# Patient Record
Sex: Male | Born: 1950 | Race: White | Hispanic: No | Marital: Married | Smoking: Never smoker
Health system: Southern US, Community
[De-identification: ages and names within clinical notes are randomized; demographics above are authoritative.]

## PROBLEM LIST (undated history)

## (undated) DIAGNOSIS — R011 Cardiac murmur, unspecified: Secondary | ICD-10-CM

## (undated) DIAGNOSIS — I351 Nonrheumatic aortic (valve) insufficiency: Secondary | ICD-10-CM

## (undated) DIAGNOSIS — I10 Essential (primary) hypertension: Secondary | ICD-10-CM

## (undated) DIAGNOSIS — I480 Paroxysmal atrial fibrillation: Secondary | ICD-10-CM

## (undated) DIAGNOSIS — N189 Chronic kidney disease, unspecified: Secondary | ICD-10-CM

## (undated) DIAGNOSIS — M1990 Unspecified osteoarthritis, unspecified site: Secondary | ICD-10-CM

## (undated) DIAGNOSIS — I2510 Atherosclerotic heart disease of native coronary artery without angina pectoris: Secondary | ICD-10-CM

## (undated) HISTORY — PX: TONSILLECTOMY: SUR1361

---

## 2000-08-23 ENCOUNTER — Ambulatory Visit (HOSPITAL_COMMUNITY): Admission: RE | Admit: 2000-08-23 | Discharge: 2000-08-23 | Payer: Self-pay | Admitting: Gastroenterology

## 2000-08-23 ENCOUNTER — Encounter (INDEPENDENT_AMBULATORY_CARE_PROVIDER_SITE_OTHER): Payer: Self-pay | Admitting: Specialist

## 2004-08-18 ENCOUNTER — Encounter (INDEPENDENT_AMBULATORY_CARE_PROVIDER_SITE_OTHER): Payer: Self-pay | Admitting: *Deleted

## 2004-08-18 ENCOUNTER — Ambulatory Visit (HOSPITAL_COMMUNITY): Admission: RE | Admit: 2004-08-18 | Discharge: 2004-08-18 | Payer: Self-pay | Admitting: Gastroenterology

## 2004-09-07 ENCOUNTER — Ambulatory Visit (HOSPITAL_COMMUNITY): Admission: RE | Admit: 2004-09-07 | Discharge: 2004-09-07 | Payer: Self-pay | Admitting: Family Medicine

## 2008-09-17 ENCOUNTER — Ambulatory Visit (HOSPITAL_COMMUNITY): Admission: RE | Admit: 2008-09-17 | Discharge: 2008-09-17 | Payer: Self-pay | Admitting: Cardiology

## 2008-09-17 ENCOUNTER — Encounter (INDEPENDENT_AMBULATORY_CARE_PROVIDER_SITE_OTHER): Payer: Self-pay | Admitting: Cardiology

## 2009-10-23 ENCOUNTER — Emergency Department (HOSPITAL_COMMUNITY): Admission: EM | Admit: 2009-10-23 | Discharge: 2009-10-23 | Payer: Self-pay | Admitting: Emergency Medicine

## 2009-11-10 ENCOUNTER — Encounter: Admission: RE | Admit: 2009-11-10 | Discharge: 2009-11-10 | Payer: Self-pay | Admitting: Family Medicine

## 2010-10-07 ENCOUNTER — Emergency Department (HOSPITAL_BASED_OUTPATIENT_CLINIC_OR_DEPARTMENT_OTHER): Admission: EM | Admit: 2010-10-07 | Discharge: 2010-10-07 | Payer: Self-pay | Admitting: Emergency Medicine

## 2010-10-07 ENCOUNTER — Ambulatory Visit: Payer: Self-pay | Admitting: Radiology

## 2011-02-20 LAB — CBC
HCT: 41.8 % (ref 39.0–52.0)
Hemoglobin: 14 g/dL (ref 13.0–17.0)
MCHC: 33.5 g/dL (ref 30.0–36.0)
MCV: 89.2 fL (ref 78.0–100.0)
Platelets: 161 10*3/uL (ref 150–400)
RBC: 4.69 MIL/uL (ref 4.22–5.81)
RDW: 13.8 % (ref 11.5–15.5)
WBC: 6.8 10*3/uL (ref 4.0–10.5)

## 2011-02-20 LAB — CK TOTAL AND CKMB (NOT AT ARMC)
CK, MB: 8.6 ng/mL — ABNORMAL HIGH (ref 0.3–4.0)
Relative Index: 1.8 (ref 0.0–2.5)
Total CK: 474 U/L — ABNORMAL HIGH (ref 7–232)

## 2011-02-20 LAB — DIFFERENTIAL
Basophils Absolute: 0 10*3/uL (ref 0.0–0.1)
Basophils Relative: 1 % (ref 0–1)
Eosinophils Absolute: 0.2 10*3/uL (ref 0.0–0.7)
Eosinophils Relative: 3 % (ref 0–5)
Lymphocytes Relative: 18 % (ref 12–46)
Lymphs Abs: 1.2 10*3/uL (ref 0.7–4.0)
Monocytes Absolute: 0.7 10*3/uL (ref 0.1–1.0)
Monocytes Relative: 11 % (ref 3–12)
Neutro Abs: 4.6 10*3/uL (ref 1.7–7.7)
Neutrophils Relative %: 67 % (ref 43–77)

## 2011-02-20 LAB — BASIC METABOLIC PANEL
BUN: 12 mg/dL (ref 6–23)
CO2: 23 mEq/L (ref 19–32)
Calcium: 10.1 mg/dL (ref 8.4–10.5)
Chloride: 102 mEq/L (ref 96–112)
Creatinine, Ser: 1.25 mg/dL (ref 0.4–1.5)
GFR calc Af Amer: >60 mL/min (ref >60)
GFR calc non Af Amer: 59 mL/min — ABNORMAL LOW (ref >60)
Glucose, Bld: 125 mg/dL — ABNORMAL HIGH (ref 70–99)
Potassium: 4 mEq/L (ref 3.5–5.1)
Sodium: 137 mEq/L (ref 135–145)

## 2011-02-20 LAB — POCT CARDIAC MARKERS
CKMB, poc: 5.1 ng/mL (ref 1.0–8.0)
CKMB, poc: 6.2 ng/mL (ref 1.0–8.0)
Myoglobin, poc: 213 ng/mL (ref 12–200)
Myoglobin, poc: 268 ng/mL (ref 12–200)
Troponin i, poc: <0.05 ng/mL (ref 0.00–0.09)
Troponin i, poc: <0.05 ng/mL (ref 0.00–0.09)

## 2011-02-20 LAB — TROPONIN I: Troponin I: 0.01 ng/mL (ref 0.00–0.06)

## 2011-02-20 LAB — GLUCOSE, CAPILLARY: Glucose-Capillary: 111 mg/dL — ABNORMAL HIGH (ref 70–99)

## 2011-02-20 LAB — D-DIMER, QUANTITATIVE: D-Dimer, Quant: <0.22 ug/mL-FEU (ref 0.00–0.48)

## 2011-04-06 NOTE — Procedures (Signed)
[facility]  Patient:    KALYB, PEMBLE                         MRN: [id number] Proc. Date: 08/23/00 Adm. Date:  [id number] Attending:  Rich Brave CC:         Lillia Carmel, M.D.   Procedure Report  PROCEDURE:  Upper endoscopy with biopsies.  SURGEON:  Florencia Reasons, M.D.  INDICATIONS:  A 60 year old gentleman with longstanding reflux symptomatology, treated with over-the-counter antacids.  FINDINGS:  A 4 cm segment of Barretts esophagus.  DESCRIPTION OF PROCEDURE:  The nature, purpose, and risks of the procedure had been discussed with the patient who provided written consent.  Sedation was fentanyl 75 mcg and Versed 6.5 mg IV without arrhythmias or desaturation.  The Olympus video endoscope was passed under direct vision.  The laryngeal tissues appeared somewhat boggy or edematous.  I was not able to get a good look at the vocal cords due to the overlying epiglottis.  It is not clear whether or not these changes reflected laryngeal pharyngeal reflux changes DD:  08/23/00 TD:  08/24/00 Job: [id number]

## 2011-04-06 NOTE — Op Note (Signed)
Dustin [name], Dustin [name]                  ACCOUNT NO.:  [id number]   MEDICAL RECORD NO.:  [id number]          PATIENT TYPE:  AMB   LOCATION:  ENDO                         FACILITY:  [facility]   PHYSICIAN:  Bernette Redbird, M.D.   DATE OF BIRTH:  06-08-1951   DATE OF PROCEDURE:  08/18/2004  DATE OF DISCHARGE:                                 OPERATIVE REPORT   PROCEDURE:  Upper endoscopy with biopsies.   INDICATIONS:  This is a 60 year old general with Barrett's esophagus, with  biopsies 2 years ago showing intestinal metaplasia without dysplasia.   ENDOSCOPIST:  Bernette Redbird, M.D.   FINDINGS:  A circumferential 3 cm segment of Barrett's.   PROCEDURE:  The nature, purpose, risks of the procedure were familiar to the  patient from prior examination.  He provided written consent. Sedation was  Fentanyl 75 mcg and Versed 10 mg IV without arrhythmias or desaturation.  The Olympus video endoscope was advanced under direct vision. The vocal  cords were not well seen. The esophagus was entered without too much  difficulty. The proximal esophagus was normal, but the distal esophagus had  a 3 cm segment of circumferential  Barrett's esophagus.  No free reflux or  active esophagitis was seen, and there was no evidence of any varices,  infection, neoplasia or any ring or stricture. Multiple biopsies from the  Barrett's segment were obtained (approximately 20).  There was a 2-3 cm  hiatal hernia. The stomach contained a small clear residual.  Retroflex  viewing showed a slightly patulous diaphragmatic hiatus.  The gastric mucosa  was unremarkable, without evidence of gastritis, erosions, ulcers, polyps or  masses.  The pylorus, duodenal bulb and second duodenum looked normal. The  scope was removed from the patient.  He tolerated the procedure well and  there were no apparent complications.   IMPRESSION:  Medium segment Barrett's esophagus (530.85).   PLAN:  Await pathology results with anticipated  follow-up in 2 years.       RB/MEDQ  D:  08/18/2004  T:  08/18/2004  Job:  [id number]   cc:   Lillia Carmel, M.D.  [address]  [place] [address]  Fax: [phone]

## 2011-04-06 NOTE — Op Note (Signed)
[facility]  Patient:    Dustin [name], Dustin [name]                         MRN: [id number] Proc. Date: 08/23/00 Adm. Date:  [id number] Attending:  Rich Brave CC:         Lillia Carmel, M.D.   Operative Report  PROCEDURE:  Upper endoscopy with biopsy.  ENDOSCOPIST:  Florencia Reasons, M.D.  INDICATIONS:  A 60 year old with longstanding reflux symptomatology treated with over-the-counter antacids.  FINDINGS:  A 4 cm segment of Barretts esophagus.  DESCRIPTION OF PROCEDURE:  The nature, purpose, and risks of the procedure had been discussed with the patient who provided written consent.  Sedation was fentanyl 75 mcg and Versed 6.5 mg IV without arrhythmias or desaturation.  The Olympus video endoscope was passed under direct vision.  The laryngeal tissues appeared somewhat boggy or edematous; I was not able to get a good look at the vocal cords due to the overlying epiglottis.  It is not clear whether or not these changes reflected laryngeal pharyngeal reflux changes or not.  The esophagus was quite easily entered.  Although the proximal was normal, beginning at 36 cm and extending down to the region of the lower esophageal sphincter at about 40 cm, there was a 4 cm segment of completely circumferential Barretts esophagus.  No mass was evident nor was there any evident ring or stricture.  Multiple biopsies were obtained from the Barretts segment at the conclusion of the procedure.  There was no infection of varices infection, free gastroesophageal reflux, or active esophagitis.  There was a 2 cm hiatal hernia.  The stomach contained no significant residual and had normal mucosa without evidence of gastritis, erosions, ulcers, polyps, or masses, and a retroflex view of the proximal stomach just showed a very small hiatal hernia with a slightly patulous diaphragmatic hiatus.  The pylorus, duodenal bulb, and second duodenum looked  normal.  The patient tolerated this procedure well, and there were no apparent complications.  IMPRESSION:  A 4 cm segment of circumferential Barretts esophagus above a small hiatal hernia.  PLAN:  The above findings are somewhat surprising and concerning. Adenocarcinoma of the esophagus is most common in middle-aged Caucasian males, and thus this patient is probably at somewhat increased risk, especially in view of his endoscopic findings.  We will await the pathology on the histologic findings, initiate PPI therapy, and follow up with the patient in the office in about one month. DD:  08/23/00 TD:  08/24/00 Job: [id number]

## 2011-04-06 NOTE — Op Note (Signed)
[facility]  Patient:    Dustin [name], Dustin [name]                         MRN: [id number] Proc. Date: 08/23/00 Adm. Date:  [id number] Attending:  Rich Brave CC:         Lillia Carmel, M.D.   Operative Report  PROCEDURE:  Colonoscopy.  ENDOSCOPIST:  Florencia Reasons, M.D.  INDICATIONS:  A 60 year old gentleman, with solitary episode of small volume hematochezia attributed most likely to hemorrhoidal origin, for evaluation to exclude polyps or cancer and for colon cancer screening.  FINDINGS:  Normal exam to the cecum.  DESCRIPTION OF PROCEDURE:  The nature, purpose, and risks of the procedure had been discussed with the patient who provided written consent.  Sedation was fentanyl 100 mcg and Versed 10 mg IV without arrhythmias or desaturation. Digital exam was unremarkable.  The Olympus adult video colonoscope was able to be advanced to the cecum without much difficulty, turning the patient into the supine position to facilitate entry into the cecum.  Pullback was then performed.  The quality of he prep was excellent, and it is felt that all areas were seen.  This was a normal examination.  No polyps, cancer, colitis, vascular malformations, or diverticular disease were appreciated.  Retroflexion in the rectum was unremarkable.  There were some slightly prominent rectal veins and, during pullout through the anal canal, I encountered moderate internal hemorrhoids.  I did not see any anal fissures.  No biopsies were obtained during this exam which the patient tolerated well and without apparent complication.  IMPRESSION:  Normal colonoscopy apart from internal hemorrhoids which are the presumed source of his recent rectal bleeding.  PLAN:  Consider flexible sigmoidoscopy in about five years. DD:  08/23/00 TD:  08/24/00 Job: [id number]

## 2011-10-16 ENCOUNTER — Other Ambulatory Visit: Payer: Self-pay | Admitting: Family Medicine

## 2011-10-16 ENCOUNTER — Ambulatory Visit
Admission: RE | Admit: 2011-10-16 | Discharge: 2011-10-16 | Disposition: A | Discharge status: Home / Self Care | Payer: 59 | Source: Ambulatory Visit | Attending: Family Medicine | Admitting: Family Medicine

## 2011-10-16 DIAGNOSIS — M549 Dorsalgia, unspecified: Secondary | ICD-10-CM

## 2011-10-16 DIAGNOSIS — M48061 Spinal stenosis, lumbar region without neurogenic claudication: Secondary | ICD-10-CM

## 2012-04-16 ENCOUNTER — Other Ambulatory Visit: Payer: Self-pay | Admitting: Gastroenterology

## 2012-04-18 ENCOUNTER — Other Ambulatory Visit: Payer: Self-pay | Admitting: Cardiology

## 2016-09-29 LAB — GLUCOSE, POCT (MANUAL RESULT ENTRY): POC Glucose: 119 mg/dl — AB (ref 70–99)

## 2017-05-14 HISTORY — PX: CARDIAC CATHETERIZATION: SHX172

## 2017-07-08 DIAGNOSIS — Z9889 Other specified postprocedural states: Secondary | ICD-10-CM

## 2017-07-08 HISTORY — PX: MITRAL VALVE REPAIR (MV)/CORONARY ARTERY BYPASS GRAFTING (CABG): SHX5983

## 2017-07-08 HISTORY — PX: CORONARY ARTERY BYPASS GRAFT: SHX141

## 2017-07-08 HISTORY — DX: Other specified postprocedural states: Z98.890

## 2017-11-01 HISTORY — PX: OTHER SURGICAL HISTORY: SHX169

## 2018-07-01 ENCOUNTER — Encounter (INDEPENDENT_AMBULATORY_CARE_PROVIDER_SITE_OTHER): Payer: Self-pay | Admitting: Family Medicine

## 2018-07-01 ENCOUNTER — Ambulatory Visit (INDEPENDENT_AMBULATORY_CARE_PROVIDER_SITE_OTHER): Payer: Medicare HMO | Admitting: Family Medicine

## 2018-07-01 ENCOUNTER — Ambulatory Visit (INDEPENDENT_AMBULATORY_CARE_PROVIDER_SITE_OTHER): Payer: Self-pay | Admitting: Family Medicine

## 2018-07-01 DIAGNOSIS — E039 Hypothyroidism, unspecified: Secondary | ICD-10-CM | POA: Insufficient documentation

## 2018-07-01 DIAGNOSIS — G4733 Obstructive sleep apnea (adult) (pediatric): Secondary | ICD-10-CM | POA: Insufficient documentation

## 2018-07-01 DIAGNOSIS — Z9889 Other specified postprocedural states: Secondary | ICD-10-CM | POA: Insufficient documentation

## 2018-07-01 DIAGNOSIS — N529 Male erectile dysfunction, unspecified: Secondary | ICD-10-CM | POA: Insufficient documentation

## 2018-07-01 DIAGNOSIS — K219 Gastro-esophageal reflux disease without esophagitis: Secondary | ICD-10-CM | POA: Insufficient documentation

## 2018-07-01 DIAGNOSIS — E785 Hyperlipidemia, unspecified: Secondary | ICD-10-CM | POA: Insufficient documentation

## 2018-07-01 DIAGNOSIS — M79602 Pain in left arm: Secondary | ICD-10-CM

## 2018-07-01 HISTORY — DX: Other specified postprocedural states: Z98.890

## 2018-07-01 HISTORY — DX: Gastro-esophageal reflux disease without esophagitis: K21.9

## 2018-07-01 HISTORY — DX: Hypothyroidism, unspecified: E03.9

## 2018-07-01 MED ORDER — METHYLPREDNISOLONE 4 MG PO TBPK: ORAL_TABLET | ORAL | 0 refills | Status: DC

## 2018-07-01 NOTE — Progress Notes (Signed)
   Office Visit Note   Patient: Dustin [name]           Date of Birth: Apr 09, 1951           MRN: [id number] Visit Date: 07/01/2018 Requested by: No referring provider defined for this encounter. PCP: Selina CooleyWitten, Bobby, MD  Subjective: Chief Complaint  Patient presents with  . Left Upper Arm - Pain    HPI: His main complaint is left arm pain.  Symptoms started in the past month or 2, he thinks either from doing some yard work or from working out at [employer].  There was no injury to cause his pain.  He started noticing pain in the anterior shoulder deep within, with some radiation into the arm.  Occasional burning pain in his elbow, no obvious numbness in his hand.  He has not determined any weakness.  He does not take any medication for his pain, it is not severe or constant.  He cannot seem to re-create the pain.  Today he is feeling pretty well.  He does have a history of heart issues with mitral valve repair a year ago.  He also had bypass of the circumflex artery at the same time.  He has done well since then from a cardiac standpoint.                ROS: Otherwise noncontributory.  Assessment & Plan: Visit Diagnoses:  1. Left arm pain   2. S/P mitral valve repair     Plan: We will refer him to physical therapy.  Medrol Dosepak if symptoms worsen.  X-rays of cervical spine and possibly MRI scan if fails to improve.  He does have a history of cardiac issues, but I doubt this is a cardiac problem based on symptoms.  Follow-Up Instructions: No follow-ups on file.   Orders:  Orders Placed This Encounter  Procedures  . Ambulatory referral to Physical Therapy   Meds ordered this encounter  Medications  . methylPREDNISolone (MEDROL DOSEPAK) 4 MG TBPK tablet    Sig: Dose pack as directed.    Dispense:  21 tablet    Refill:  0      Procedures: No procedures performed   Clinical Data: No additional findings.  Objective: Vital Signs: There were no vitals taken for this  visit.  Physical Exam: Neck range of motion is full.  Shoulder range of motion is also full and pain-free.  No tenderness to palpation around his shoulder.  Upper extremity strength and reflexes are normal except slight weakness with intrinsic hand strength.  DTRs are 2+.  Cannot reproduce his pain on exam today.   Specialty Comments:  No specialty comments available.  Imaging: No results found.   PMFS History: Patient Active Problem List   Diagnosis Date Noted  . S/P mitral valve repair 07/01/2018  . OSA (obstructive sleep apnea) 07/01/2018  . GERD (gastroesophageal reflux disease) 07/01/2018  . Hyperlipidemia 07/01/2018  . Erectile dysfunction 07/01/2018  . Hypothyroid 07/01/2018   History reviewed. No pertinent past medical history.  History reviewed. No pertinent family history.  History reviewed. No pertinent surgical history. Social History   Occupational History  . Not on file  Tobacco Use  . Smoking status: Not on file  Substance and Sexual Activity  . Alcohol use: Not on file  . Drug use: Not on file  . Sexual activity: Not on file

## 2018-09-20 LAB — GLUCOSE, POCT (MANUAL RESULT ENTRY): POC Glucose: 142 mg/dl — AB (ref 70–99)

## 2020-11-02 ENCOUNTER — Ambulatory Visit (INDEPENDENT_AMBULATORY_CARE_PROVIDER_SITE_OTHER): Payer: Medicare HMO | Admitting: Family Medicine

## 2020-11-02 ENCOUNTER — Other Ambulatory Visit: Payer: Self-pay

## 2020-11-02 ENCOUNTER — Encounter: Payer: Self-pay | Admitting: Family Medicine

## 2020-11-02 DIAGNOSIS — G8929 Other chronic pain: Secondary | ICD-10-CM | POA: Diagnosis not present

## 2020-11-02 DIAGNOSIS — M545 Low back pain, unspecified: Secondary | ICD-10-CM

## 2020-11-02 DIAGNOSIS — M79672 Pain in left foot: Secondary | ICD-10-CM | POA: Diagnosis not present

## 2020-11-02 NOTE — Progress Notes (Signed)
Office Visit Note   Patient: Dustin [name]           Date of Birth: 1951-10-07           MRN: [id number] Visit Date: 11/02/2020 Requested by: [place] [address] [place],  [place] [address] PCP: Selina Cooley, MD  Subjective: Chief Complaint  Patient presents with  . Lower Back - Pain    Pain started about 8 months ago. Has had problems with the pain off and on x years. Worse at night with lying down. Excruciating pain across lower back when he first gets up in the morning. The pain is very low - patient was unsure if it is his back or his hips that are hurting. Ibuprofen 600 mg helps ease it. Has also tried Tylenol, celebrex and turmeric.  Marland Kitchen Left Heel - Pain    Pain plantar aspect of heel - feels it without shoes. Hurts off & on.    HPI: He is here with low back pain.  Symptoms started about 8 months ago, no injury.  He woke up 1 morning with pain in the lumbosacral.  And he has had pain on a daily basis since then.  I have seen him in the past for back pain, and in 2012 he had an MRI scan showing severe spinal stenosis at L4-5 with severe facet DJD.  Despite the MRI findings, he has done remarkably well over the years without requiring any major intervention.  Even with his current daily pain, he is still able to walk about 15,000 paces per day without having any radicular type symptoms, weakness or numbness.  He is using ibuprofen daily for pain and that seems to help.  He tried Celebrex but it did not work as well.  He tried stopping Lipitor for a few weeks but that did not make any difference.  Denies bowel or bladder dysfunction.  He had x-rays taken by his PCP showing severe degenerative changes.  He is also been having pain on the plantar aspect of his left heel for a couple months.                ROS:   All other systems were reviewed and are negative.  Objective: Vital Signs: There were no vitals taken for this visit.  Physical Exam:  General:  Alert and  oriented, in no acute distress. Pulm:  Breathing unlabored. Psy:  Normal mood, congruent affect. Skin: No rash  Low back: I am unable to reproduce any pain by palpation today.  Negative straight leg raise, no pain with internal/external hip range of motion and he has good motion bilaterally.  Lower extremity strength and reflexes are normal. Left heel: He has tight hamstrings and heel cords.  He is tender at the medial origin of the plantar fascia.    Imaging: No results found.  Assessment & Plan: 1.  Chronic low back pain with history of severe spinal stenosis, neurologic exam is nonfocal. -We will order a new MRI scan to further evaluate.  Could contemplate facet injections, physical therapy, etc. depending on the findings.  2.  Left heel plantar fasciitis -Hamstring and heel cord stretching.     Procedures: No procedures performed        PMFS History: Patient Active Problem List   Diagnosis Date Noted  . S/P mitral valve repair 07/01/2018  . OSA (obstructive sleep apnea) 07/01/2018  . GERD (gastroesophageal reflux disease) 07/01/2018  . Hyperlipidemia 07/01/2018  .  Erectile dysfunction 07/01/2018  . Hypothyroid 07/01/2018   History reviewed. No pertinent past medical history.  History reviewed. No pertinent family history.  History reviewed. No pertinent surgical history. Social History   Occupational History  . Not on file  Tobacco Use  . Smoking status: Not on file  . Smokeless tobacco: Not on file  Substance and Sexual Activity  . Alcohol use: Not on file  . Drug use: Not on file  . Sexual activity: Not on file

## 2020-11-28 ENCOUNTER — Telehealth: Payer: Self-pay | Admitting: Family Medicine

## 2020-11-28 ENCOUNTER — Ambulatory Visit
Admission: RE | Admit: 2020-11-28 | Discharge: 2020-11-28 | Disposition: A | Discharge status: Home / Self Care | Payer: Medicare HMO | Source: Ambulatory Visit | Attending: Family Medicine | Admitting: Family Medicine

## 2020-11-28 ENCOUNTER — Other Ambulatory Visit: Payer: Medicare HMO

## 2020-11-28 DIAGNOSIS — G8929 Other chronic pain: Secondary | ICD-10-CM

## 2020-11-28 NOTE — Telephone Encounter (Signed)
MRI shows severe spinal stenosis (narrowing of the spinal canal) at the L4-5 level, even worse than it was in 2012.    Treatment options include:  - Trial of physical therapy. - Referral for epidural steroid injections. - Surgical consult if fails conservative treatment.

## 2020-11-29 NOTE — Telephone Encounter (Signed)
I called and advised the patient of the results. He would like to try PT first, at our location.

## 2020-11-30 NOTE — Addendum Note (Signed)
Addended by: Lillia Carmel on: 11/30/2020 08:32 AM   Modules accepted: Orders

## 2020-11-30 NOTE — Telephone Encounter (Signed)
Ordered

## 2020-12-12 ENCOUNTER — Ambulatory Visit: Payer: Medicare HMO | Admitting: Physical Therapy

## 2020-12-12 ENCOUNTER — Other Ambulatory Visit: Payer: Self-pay

## 2020-12-12 ENCOUNTER — Encounter: Payer: Self-pay | Admitting: Physical Therapy

## 2020-12-12 DIAGNOSIS — M545 Low back pain, unspecified: Secondary | ICD-10-CM | POA: Diagnosis not present

## 2020-12-12 DIAGNOSIS — G8929 Other chronic pain: Secondary | ICD-10-CM | POA: Diagnosis not present

## 2020-12-12 NOTE — Patient Instructions (Signed)
Access Code: [email or web address] URL: [email or web address] Date: 12/12/2020 Prepared by: [name]  Exercises Alternating Single Leg Bridge - 2 x daily - 7 x weekly - 3 sets - 10 reps Supine Double Knee to Chest - 2 x daily - 7 x weekly - 1 sets - 3 reps - 30 hold Seated Lumbar Flexion Stretch - 2 x daily - 7 x weekly - 1 sets - 10 reps - 10 hold Standing Hamstring Curl with Resistance - 2 x daily - 7 x weekly - 2-3 sets - 10 reps Anti-Rotation Lateral Stepping with Press - 2 x daily - 7 x weekly - 3 sets - 10 reps Standard Plank - 2 x daily - 7 x weekly - 1 sets - 3 reps - 20-30 sec hold

## 2020-12-12 NOTE — Therapy (Signed)
[facility] Physical Therapy [address] [place], [place], [address] Phone: [phone]   Fax:  [phone]  Physical Therapy Evaluation  Patient Details  Name: Dustin [name] MRN: [id number] Date of Birth: 04-14-1951 Referring Provider (PT): Lavada Mesi, MD   Encounter Date: 12/12/2020   PT End of Session - 12/12/20 1322    Visit Number 1    Number of Visits 4    Date for PT Re-Evaluation 01/09/21    PT Start Time 1013    PT Stop Time 1102    PT Time Calculation (min) 49 min    Activity Tolerance Patient tolerated treatment well;No increased pain    Behavior During Therapy [facility] for tasks assessed/performed           History reviewed. No pertinent past medical history.  History reviewed. No pertinent surgical history.  There were no vitals filed for this visit.    Subjective Assessment - 12/12/20 1004    Subjective Patient reports low back pain that is worse upon wakening and has to complete a series of stretches for it to subside. He also reports he had open heart surgery in 2018 and since then he has done alot of walking and stretching, and likes to bike around trails. He has a daily goal 15,000 steps a day and usually gets at least 10,000.  He has alot of stretches he has implemented into his daily routine to keep the pain in check. He states his pain has been going on the past 8 months but has had pain for a while since his recent MRI showing L4/L5 spinal stenosis. He notes that he likes to sleep on his stomach or his side, cannot sleep on his back. For the most part he is not  limited in his daily life but would like to decrease pain he feels upon rising daily. He denies any N/T or radiating symtoms but does note some stiffness at times on bottom of his foot into the heel that he combats with massage and stretching.    Limitations Other (comment)   lying down   How long can you sit comfortably? long period of time    How long can you stand comfortably?  as long as necessary with stretching to break it up    How long can you walk comfortably? has fatigue with walking and stops and stretches when needed but can walk over 15,000 steps    Diagnostic tests MRI shows severe spinal stenosis (narrowing of the spinal canal) at the L4-5 level, even worse than it was in 2012    Patient Stated Goals hopes to decrease pain and get opinion on his stretching/exercise routine    Currently in Pain? Yes    Pain Score 2    aware of the pain but can get up to a 6   Pain Location Back    Pain Orientation Lower    Pain Descriptors / Indicators Dull;Aching    Pain Type Chronic pain    Pain Radiating Towards --    Pain Onset More than a month ago    Pain Frequency Intermittent    Aggravating Factors  waking up in am    Pain Relieving Factors advil    Multiple Pain Sites No              [facility] PT Assessment - 12/12/20 0001      Assessment   Medical Diagnosis Chronic Low back pain    Referring Provider (PT) Lavada Mesi, MD  Onset Date/Surgical Date 04/11/20    Next MD Visit none    Prior Therapy none      Precautions   Precautions None      Restrictions   Weight Bearing Restrictions No      Balance Screen   Has the patient fallen in the past 6 months No    Has the patient had a decrease in activity level because of a fear of falling?  No    Is the patient reluctant to leave their home because of a fear of falling?  No      Home [employer] manag[employer]vironment   Living Environment Private residence    Home Layout Two level    Additional Comments Patient has 2 flights of stairs in house, rarely uses basement stairs, other set to get to bedroom      Prior Function   Level of Independence Independent    Vocation Retired    Leisure walking, biking, going to gym      Cognition   Overall Cognitive Status Within Functional Limits for tasks assessed      Observation/Other Assessments   Focus on Therapeutic Outcomes (FOTO)  functional intake score 59%, predicted  68%      Posture/Postural Control   Posture/Postural Control No significant limitations      ROM / Strength   AROM / PROM / Strength AROM;Strength      AROM   Overall AROM  Within functional limits for tasks performed    AROM Assessment Site Lumbar    Lumbar Flexion WFL    Lumbar Extension 50%   advised pt. to avoid extension for his stretching regimen due to spinal stenosis   Lumbar - Right Side Bend WFL    Lumbar - Left Side Bend WFL    Lumbar - Right Rotation WFL    Lum[facility] - [facility]ation [facility]      Strength   Strength Assessment Site Knee;Hip;Ankle    Right/Left Hip Right;Left    Right Hip Flexion 5/5    Left Hip Flexion 5/5    Right/Left Knee Right;Left    Right Knee Flexion 4+/5    Right Knee Extension 5/5    Left Knee Flexion 4/5    Left Knee Extension 5/5    Right/Left Ankle Right;Left    Right Ankle Dorsiflexion 5/5    Left Ankle Dorsiflexion 5/5      Palpation   Spinal mobility no PA hypomobility indicated    Palpation comment no tightness in paraspinal area      Special Tests    Special Tests Lumbar;Sacrolliac Tests    Lumbar Tests --    Sacroiliac Tests  Sacral Thrust      Sacral thrust    Findings Negative    Side --   both                     Objective measurements completed on examination: See above findings.       [facility] Adult [facility] Treatment/Exercise - 12/12/20 0001      Exercises   Exercises Lumbar      Lumbar Exercises: Aerobic   Recumbent Bike L8 8 min                  PT Education - 12/12/20 1321    Education Details HEP, POC, spine care with spinal stenosis, sleeping corrections    Person(s) Educated Patient    Methods Explanation;Demonstration;Handout    Comprehension Verbalized understanding;Returned demonstration  PT Short Term Goals - 12/12/20 1335      PT SHORT TERM GOAL #1   Title Patient will be compliant and independent with HEP    Baseline HEP given    Time 2    Period Weeks    Status  New    Target Date 12/26/20             PT Long Term Goals - 12/12/20 1337      PT LONG TERM GOAL #1   Title Patient will improve FOTO score    Baseline functional intake score 59%, predicted 68%    Time 4    Period Weeks    Status New    Target Date 01/09/21      PT LONG TERM GOAL #2   Title Patient will improve all LE strength to 5/5    Baseline knee flexion 4/5    Time 4    Period Weeks    Status New    Target Date 01/09/21      PT LONG TERM GOAL #3   Title Patient will report less than 2/10 pain upon awakening in the morning    Baseline pain can be 6 or 7 upon rising    Time 4    Period Weeks    Status New    Target Date 01/09/21                  Plan - 12/12/20 1323    Clinical Impression Statement Patient presents as a 70 y/o male with LBP that is intermittent and flared up upon wakening in the morning and is bothered with lying down/sleeping. Patient is very active and very mobile showing minimal strength deficits in LE. He has a dedicated stretching regimen and walks without pain for miles.He is showing pain with lumbar extension bias consistent with lumbar spinal stenosis which was indicated on his recent MRI. Patient can benefit from skilled PT in order to add in more flexion based exercise and minimize extension actvities to decrease the close packed position of the spine. He is doing well overall and just needs a few visits to be able to manage his pain and modify activities PRN. Patient noted his biggest problem is with sleeping on his stomach and he wakes up with pain, educated patient to sleep with pillow underneath abdomen to move lumbar spine into flexion to decrease pain in the mornings. Patient did note some deficits with his balance that will be further investigated next visit to add to HEP.    Personal Factors and Comorbidities Age;Comorbidity 1    Examination-Activity Limitations Sleep    Stability/Clinical Decision Making Stable/Uncomplicated     Clinical Decision Making Low    Rehab Potential Excellent    PT Frequency 1x / week    PT Duration 4 weeks    PT Treatment/Interventions ADLs/Self Care Home Management;Cryotherapy;Stair training;Functional mobility training;Therapeutic activities;Therapeutic exercise;Balance training;Neuromuscular re-education    PT Next Visit Plan Check in with HEP, ask about sleep position change, test balance, bird dog    PT Home Exercise Plan Access code: [email or web address]    Consulted and Agree with Plan of Care Patient           Patient will benefit from skilled therapeutic intervention in order to improve the following deficits and impairments:  Decreased balance,Decreased strength,Pain  Visit Diagnosis: Chronic low back pain, unspecified back pain laterality, unspecified whether sciatica present - Plan: PT plan of care cert/re-cert     Problem List  Patient Active Problem List   Diagnosis Date Noted  . S/P mitral valve repair 07/01/2018  . OSA (obstructive sleep apnea) 07/01/2018  . GERD (gastroesophageal reflux disease) 07/01/2018  . Hyperlipidemia 07/01/2018  . Erectile dysfunction 07/01/2018  . Hypothyroid 07/01/2018    [name], SPT  12/12/2020, 1:47 PM   During this treatment session, this physical therapist was present, participating in and directing the treatment.   This note has been reviewed and this clinician agrees with the information provided.   [name], PT, DPT 12/12/20 2:52 PM . [facility] Physical Therapy [address] [place], [place], [address] Phone: [phone]   Fax:  [phone]  Name: Dustin [name] MRN: [id number] Date of Birth: November 11, 1951

## 2020-12-22 ENCOUNTER — Encounter: Payer: Medicare HMO | Admitting: Physical Therapy

## 2020-12-27 ENCOUNTER — Other Ambulatory Visit: Payer: Self-pay

## 2020-12-27 ENCOUNTER — Ambulatory Visit: Payer: Medicare HMO | Admitting: Physical Therapy

## 2020-12-27 ENCOUNTER — Encounter: Payer: Self-pay | Admitting: Physical Therapy

## 2020-12-27 DIAGNOSIS — G8929 Other chronic pain: Secondary | ICD-10-CM | POA: Diagnosis not present

## 2020-12-27 DIAGNOSIS — M545 Low back pain, unspecified: Secondary | ICD-10-CM

## 2020-12-27 NOTE — Patient Instructions (Addendum)
  Access Code: [email or web address] URL: [email or web address] Date: 12/27/2020 Prepared by: [name]  Exercises Seated Lumbar Flexion Stretch - 2 x daily - 7 x weekly - 1 sets - 10 reps - 10 hold Standing Hamstring Curl with Resistance - 2 x daily - 7 x weekly - 2-3 sets - 10 reps Anti-rotation with lateral stepping - 2 x daily - 7 x weekly - 3 sets - 10 reps Standing Diagonal Lift with Anchored Resistance - 2 x daily - 6 x weekly - 3 sets - 10 reps Standard Plank - 2 x daily - 7 x weekly - 1 sets - 3 reps - 20-30 sec hold Sit to Stand Without Arm Support - 2 x daily - 7 x weekly - 2-3 sets - 10 reps Tandem Stance with Support - 2 x daily - 7 x weekly - 3 reps - 20 sec hold

## 2020-12-27 NOTE — Therapy (Signed)
[facility] Physical Therapy [address] [place], [place], [address] Phone: [phone]   Fax:  [phone]  Physical Therapy Treatment  Patient Details  Name: Dustin [name] MRN: [id number] Date of Birth: 09/28/1951 Referring Provider (PT): Lavada Mesi, MD   Encounter Date: 12/27/2020   PT End of Session - 12/27/20 1059    Visit Number 2    Number of Visits 4    Date for PT Re-Evaluation 01/09/21    PT Start Time 1050    PT Stop Time 1148    PT Time Calculation (min) 58 min    Activity Tolerance Patient tolerated treatment well;No increased pain    Behavior During Therapy [facility] for tasks assessed/performed           History reviewed. No pertinent past medical history.  History reviewed. No pertinent surgical history.  There were no vitals filed for this visit.   Subjective Assessment - 12/27/20 1056    Subjective Patient relays overall his back feels better with less pain overall and he is able to take less NSAIDs now. He also relays he is sleeping better now. He does say that he gets dizzy when he lays on his back and he is confused about the anti rotation exercise. He denies any back pain upon arrival today.    Limitations Other (comment)   lying down   How long can you sit comfortably? long period of time    How long can you stand comfortably? as long as necessary with stretching to break it up    How long can you walk comfortably? has fatigue with walking and stops and stretches when needed but can walk over 15,000 steps    Diagnostic tests MRI shows severe spinal stenosis (narrowing of the spinal canal) at the L4-5 level, even worse than it was in 2012    Patient Stated Goals hopes to decrease pain and get opinion on his stretching/exercise routine    Currently in Pain? No/denies    Pain Onset More than a month ago           [facility] Adult PT Treatment/Exercise - 12/27/20 0001      Lumbar Exercises: Aerobic   Recumbent Bike L8 10 min      Lumbar  Exercises: Standing   Other Standing Lumbar Exercises tandem walking on foam beam 4 laps with forward gaze cues, side stepping on foam beam 4 laps, tandem stance 4x 30 sec bilat, SLS on foam beam with cone tapping on 4'' step 15x each leg    Other Standing Lumbar Exercises Plank on stability ball at table height 10 sec hold x4, antirotation 20 reps each side with red theraband      Lumbar Exercises: Seated   Sit to Stand 20 reps   no UE   Other Seated Lumbar Exercises stability ball marching 20 each leg, stability ball theraband diagonal chops 10x each side                  PT Education - 12/27/20 1403    Education Details HEP updated    Person(s) Educated Patient    Methods Explanation;Demonstration;Tactile cues;Verbal cues;Handout    Comprehension Verbalized understanding            PT Short Term Goals - 12/12/20 1335      PT SHORT TERM GOAL #1   Title Patient will be compliant and independent with HEP    Baseline HEP given    Time 2    Period  Weeks    Status New    Target Date 12/26/20             PT Long Term Goals - 12/12/20 1337      PT LONG TERM GOAL #1   Title Patient will improve FOTO score    Baseline functional intake score 59%, predicted 68%    Time 4    Period Weeks    Status New    Target Date 01/09/21      PT LONG TERM GOAL #2   Title Patient will improve all LE strength to 5/5    Baseline knee flexion 4/5    Time 4    Period Weeks    Status New    Target Date 01/09/21      PT LONG TERM GOAL #3   Title Patient will report less than 2/10 pain upon awakening in the morning    Baseline pain can be 6 or 7 upon rising    Time 4    Period Weeks    Status New    Target Date 01/09/21                 Plan - 12/27/20 1358    Clinical Impression Statement Patient is doing well with his HEP except dizziness with supine exercises. Refocused HEP to add more appropriate exercises and add in balance componenet for increased function and  coordination.    Personal Factors and Comorbidities Age;Comorbidity 1    Examination-Activity Limitations Sleep    Stability/Clinical Decision Making Stable/Uncomplicated    Rehab Potential Excellent    PT Frequency 1x / week    PT Duration 4 weeks    PT Treatment/Interventions ADLs/Self Care Home Management;Cryotherapy;Stair training;Functional mobility training;Therapeutic activities;Therapeutic exercise;Balance training;Neuromuscular re-education    PT Next Visit Plan Check in with HEP, progression for balance, add in more unstable core exercises exercise ball?    PT Home Exercise Plan Access code: [email or web address]    Consulted and Agree with Plan of Care Patient           Patient will benefit from skilled therapeutic intervention in order to improve the following deficits and impairments:  Decreased balance,Decreased strength,Pain  Visit Diagnosis: Chronic low back pain, unspecified back pain laterality, unspecified whether sciatica present     Problem List Patient Active Problem List   Diagnosis Date Noted  . S/P mitral valve repair 07/01/2018  . OSA (obstructive sleep apnea) 07/01/2018  . GERD (gastroesophageal reflux disease) 07/01/2018  . Hyperlipidemia 07/01/2018  . Erectile dysfunction 07/01/2018  . Hypothyroid 07/01/2018    [name], SPT 12/27/2020, 2:22 PM   During this treatment session, this physical therapist was present, participating in and directing the treatment.   This note has been reviewed and this clinician agrees with the information provided.  [name], PT, DPT 12/27/20 3:37 PM   [facility] Physical Therapy [address] [place], [place], [address] Phone: [phone]   Fax:  [phone]  Name: ADRIANO BISCHOF MRN: [id number] Date of Birth: [birth year]

## 2021-01-03 ENCOUNTER — Other Ambulatory Visit: Payer: Self-pay

## 2021-01-03 ENCOUNTER — Ambulatory Visit: Payer: Medicare HMO | Admitting: Physical Therapy

## 2021-01-03 DIAGNOSIS — M545 Low back pain, unspecified: Secondary | ICD-10-CM | POA: Diagnosis not present

## 2021-01-03 DIAGNOSIS — G8929 Other chronic pain: Secondary | ICD-10-CM | POA: Diagnosis not present

## 2021-01-03 NOTE — Therapy (Signed)
[facility] Physical Therapy [address] [place], [place], [address] Phone: [phone]   Fax:  [phone]  Physical Therapy Treatment  Patient Details  Name: Dustin [name] MRN: [id number] Date of Birth: 1951/05/23 Referring Provider (PT): Lavada Mesi, MD   Encounter Date: 01/03/2021   PT End of Session - 01/03/21 1152    Visit Number 3    Number of Visits 4    Date for PT Re-Evaluation 01/09/21    PT Start Time 1100    PT Stop Time 1145    PT Time Calculation (min) 45 min    Activity Tolerance Patient tolerated treatment well;No increased pain    Behavior During Therapy [facility] for tasks assessed/performed           No past medical history on file.  No past surgical history on file.  There were no vitals filed for this visit.   Subjective Assessment - 01/03/21 1150    Subjective Patient reports continued relief without the use of NSAIDs with the exception of before sleeping sometimes. He has been working on balance at home. Patient denies any pain today.    Limitations Other (comment)   lying down   How long can you sit comfortably? long period of time    How long can you stand comfortably? as long as necessary with stretching to break it up    How long can you walk comfortably? has fatigue with walking and stops and stretches when needed but can walk over 15,000 steps    Diagnostic tests MRI shows severe spinal stenosis (narrowing of the spinal canal) at the L4-5 level, even worse than it was in 2012    Patient Stated Goals hopes to decrease pain and get opinion on his stretching/exercise routine    Currently in Pain? No/denies    Pain Onset More than a month ago            [facility] Adult PT Treatment/Exercise - 01/03/21 0001      Lumbar Exercises: Stretches   Double Knee to Chest Stretch 2 reps;20 seconds      Lumbar Exercises: Aerobic   Recumbent Bike L1      Lumbar Exercises: Standing   Other Standing Lumbar Exercises tandem walking  on foam beam 4 laps with forward gaze cues, side stepping on foam beam 4 laps, tandem stance 6x 30 sec bilat with head nods and side to side gaze progress gradually from full UE to prn UE, SLS on foam beam with cone tapping on 4'' step 20x each leg with forward mirror gaze, SLS 30 sec bilat 2x    Other Standing Lumbar Exercises Plank on stability ball at table height 20 sec hold x4, stability ball overhead walking 20 feet 6x      Lumbar Exercises: Seated   Sit to Stand 20 reps   no UE   Other Seated Lumbar Exercises stability ball rollouts 10x 10 seconds    Other Seated Lumbar Exercises stability ball marching 10x each side no UE      Lumbar Exercises: Supine   Other Supine Lumbar Exercises reverse bird dog with stability ball 10x each leg                    PT Short Term Goals - 01/03/21 1158      PT SHORT TERM GOAL #1   Title Patient will be compliant and independent with HEP    Baseline HEP given    Time 2  Period Weeks    Status Achieved    Target Date 12/26/20             PT Long Term Goals - 01/03/21 1158      PT LONG TERM GOAL #1   Title Patient will improve FOTO score    Baseline functional intake score 59%, predicted 68%    Time 4    Period Weeks    Status On-going      PT LONG TERM GOAL #2   Title Patient will improve all LE strength to 5/5    Baseline knee flexion 4/5    Time 4    Period Weeks    Status On-going      PT LONG TERM GOAL #3   Title Patient will report less than 2/10 pain upon awakening in the morning    Baseline pain can be 6 or 7 upon rising    Time 4    Period Weeks    Status On-going                 Plan - 01/03/21 1153    Clinical Impression Statement Patient was able to tolerate 2 supine exercises without increased dizziness this visit. This session focused on progressing his core stability and improving his static and dynamic balance. Patient has excellent endurance during tx session but requires additional cueing  for new exercises. Next visit update measurements, check in on goals and reassess HEP for continuing independently.    Personal Factors and Comorbidities Age;Comorbidity 1    Examination-Activity Limitations Sleep    Stability/Clinical Decision Making Stable/Uncomplicated    Rehab Potential Excellent    PT Frequency 1x / week    PT Duration 4 weeks    PT Treatment/Interventions ADLs/Self Care Home Management;Cryotherapy;Stair training;Functional mobility training;Therapeutic activities;Therapeutic exercise;Balance training;Neuromuscular re-education    PT Next Visit Plan update for advanced HEP, reassess, FOTO, discharge if patient feels ready    PT Home Exercise Plan Access code: [email or web address]    Consulted and Agree with Plan of Care Patient           Patient will benefit from skilled therapeutic intervention in order to improve the following deficits and impairments:  Decreased balance,Decreased strength,Pain  Visit Diagnosis: Chronic low back pain, unspecified back pain laterality, unspecified whether sciatica present     Problem List Patient Active Problem List   Diagnosis Date Noted  . S/P mitral valve repair 07/01/2018  . OSA (obstructive sleep apnea) 07/01/2018  . GERD (gastroesophageal reflux disease) 07/01/2018  . Hyperlipidemia 07/01/2018  . Erectile dysfunction 07/01/2018  . Hypothyroid 07/01/2018    [name], SPT 01/03/2021, 12:00 PM   During this treatment session, this physical therapist was present, participating in and directing the treatment.   This note has been reviewed and this clinician agrees with the information provided.  [name], PT, DPT 01/03/21 1:45 PM   [facility] Physical Therapy [address] [place], [place], [address] Phone: [phone]   Fax:  [phone]  Name: Dustin [name] MRN: [id number] Date of Birth: [birth year]

## 2021-01-09 ENCOUNTER — Other Ambulatory Visit: Payer: Self-pay

## 2021-01-09 ENCOUNTER — Ambulatory Visit (INDEPENDENT_AMBULATORY_CARE_PROVIDER_SITE_OTHER): Payer: Medicare HMO | Admitting: Family Medicine

## 2021-01-09 ENCOUNTER — Encounter: Payer: Self-pay | Admitting: Family Medicine

## 2021-01-09 DIAGNOSIS — M79602 Pain in left arm: Secondary | ICD-10-CM

## 2021-01-09 NOTE — Progress Notes (Signed)
Office Visit Note   Patient: Dustin [name]           Date of Birth: [birth year]           MRN: [id number] Visit Date: 01/09/2021 Requested by: [place] [address] [place],  [place] [address] PCP: Selina Cooley, MD  Subjective: Chief Complaint  Patient presents with  . Left Wrist - Pain    Has been having pain in the wrist and thumb "for a long time." The day after he called for an appointment last week, a bruise appeared on his forearm. He recalls no injury. The bruised area is a "little sensitive." Right-hand dominant.  . Left Forearm - Pain    HPI: He is here with left forearm pain.  Symptoms started about a week and a half ago, no injury.  He started noticing soreness on the volar aspect of his forearm.  Then 1 day when he went to the gym he began noticing increasing pain lifting weights with his left arm, and then more intense pain after doing some chest exercises.  Over the next couple days his forearm began to swell and became bruised.  The bruising is resolving but still present.  He no longer has the same pain that he had before.  He is right-hand dominant.  I saw him a couple years ago for left arm radicular pain but that went away and his pain is unlike other pain he has had.  From a spinal stenosis standpoint, he is improved as long as he does his physical therapy exercises on a daily basis.  Symptoms are tolerable right now, he does not feel that surgery is needed.                ROS:   All other systems were reviewed and are negative.  Objective: Vital Signs: There were no vitals taken for this visit.  Physical Exam:  General:  Alert and oriented, in no acute distress. Pulm:  Breathing unlabored. Psy:  Normal mood, congruent affect. Skin: There is resolving ecchymosis on the left volar forearm. Left arm: There is no swelling in the hand or fingers.  There is no tenderness to palpation of the flexor muscle belly where the bruising is present.  He has no  pain with forearm pronation and supination, wrist flexion, finger and thumb flexion against resistance.  His strength is normal and his tendon functions are intact.   Imaging: No results found.  Assessment & Plan: 1.  Resolving left forearm pain, etiology uncertain. -At this point he will resume his normal workouts and as long as his pain does not return, follow-up as needed.  He will notify me if symptoms come back.     Procedures: No procedures performed        PMFS History: Patient Active Problem List   Diagnosis Date Noted  . S/P mitral valve repair 07/01/2018  . OSA (obstructive sleep apnea) 07/01/2018  . GERD (gastroesophageal reflux disease) 07/01/2018  . Hyperlipidemia 07/01/2018  . Erectile dysfunction 07/01/2018  . Hypothyroid 07/01/2018   No past medical history on file.  No family history on file.  No past surgical history on file. Social History   Occupational History  . Not on file  Tobacco Use  . Smoking status: Not on file  . Smokeless tobacco: Not on file  Substance and Sexual Activity  . Alcohol use: Not on file  . Drug use: Not on file  . Sexual activity: Not on  file

## 2021-01-10 ENCOUNTER — Ambulatory Visit: Payer: Medicare HMO | Admitting: Physical Therapy

## 2021-01-10 ENCOUNTER — Encounter: Payer: Self-pay | Admitting: Physical Therapy

## 2021-01-10 DIAGNOSIS — M545 Low back pain, unspecified: Secondary | ICD-10-CM

## 2021-01-10 DIAGNOSIS — G8929 Other chronic pain: Secondary | ICD-10-CM | POA: Diagnosis not present

## 2021-01-10 DIAGNOSIS — R2689 Other abnormalities of gait and mobility: Secondary | ICD-10-CM

## 2021-01-10 DIAGNOSIS — M6281 Muscle weakness (generalized): Secondary | ICD-10-CM

## 2021-01-10 NOTE — Therapy (Signed)
[facility] [address] [place], [place], [address] Phone: [phone]   Fax:  [phone]  Physical Therapy Treatment/recert Referring diagnosis? M54.5 Treatment diagnosis? (if different than referring diagnosis) m54.5 What was this (referring dx) caused by? _0  Surgery _1  Fall _2  Ongoing issue _3  Arthritis _4  Other: ____________  Laterality: _5  Rt _6  Lt _7  Both  Check all possible CPT codes:      _8  97110 (Therapeutic Exercise)  _9  92507 (SLP Treatment)  _10  85631 (Neuro Re-ed)   _11  92526 (Swallowing Treatment)   _12  49702 (Gait Training)   _13  63785 (Cognitive Training, 1st 15 minutes) _14  97140 (Manual Therapy)   _15  97130 (Cognitive Training, each add'l 15 minutes)  _16  97530 (Therapeutic Activities)  _17  Other, List CPT Code ____________    _18  88502 (Self Care)       _19  All codes above (97110 - 97535)  _20  97012 (Mechanical Traction)  _21  97014 (E-stim Unattended)  _22  97032 (E-stim manual)  _23  97033 (Ionto)  _24  97035 (Ultrasound)  _25  97760 (Orthotic Fit) _26  97750 (Physical Performance Training) _27  [id number] (Aquatic Therapy) _28  77412 (Contrast Bath) _29  87867 (Paraffin) _30  97597 (Wound Care 1st 20 sq cm) _31  97598 (Wound Care each add'l 20 sq cm) _32  97016 (Vasopneumatic Device) _33  67209 (Orthotic Training) _34  [phone] (Prosthetic Training)   Patient Details  Name: Dustin [name] MRN: [id number] Date of Birth: 24-Aug-1951 Referring Provider (PT): Eunice Blase, MD   Encounter Date: 01/10/2021   PT End of Session - 01/10/21 1117    Visit Number 4    Number of Visits 9    Date for PT Re-Evaluation 02/14/21    Authorization Type [employer]    PT Start Time 1057    PT Stop Time 1140    PT Time Calculation (min) 43 min    Activity Tolerance Patient tolerated treatment well;No increased pain    Behavior During Therapy [facility] for tasks assessed/performed           History reviewed. No pertinent past medical history.  History  reviewed. No pertinent surgical history.  There were no vitals filed for this visit.   Subjective Assessment - 01/10/21 1116    Subjective He relays he has good days and bad days with low back pain. Today he had to take NSAIDs due to back pain this morning but relays it has eased off to 1/10 back pain upon arrival to PT today. He feels he needs to extend his PT longer.    Limitations Other (comment)   lying down   How long can you sit comfortably? long period of time    How long can you stand comfortably? as long as necessary with stretching to break it up    How long can you walk comfortably? has fatigue with walking and stops and stretches when needed but can walk over 15,000 steps    Diagnostic tests MRI shows severe spinal stenosis (narrowing of the spinal canal) at the L4-5 level, even worse than it was in 2012    Patient Stated Goals hopes to decrease pain and get opinion on his stretching/exercise routine    Pain Onset More than a month ago              [facility] PT Assessment - 01/10/21 0001      Assessment   Medical Diagnosis Chronic Low back pain    Referring Provider (PT) Eunice Blase, MD      AROM   Overall AROM Comments lumbar ROM [facility]  Strength   Right Hip Flexion 5/5    Left Hip Flexion 5/5    Right Knee Flexion 4+/5    Right Knee Extension 5/5    Left Knee Flexion 4+/5    Left Knee Extension 5/5                         [facility] Adult PT Treatment/Exercise - 01/10/21 0001      Lumbar Exercises: Aerobic   Recumbent Bike L8 X 10 min      Lumbar Exercises: Machines for Strengthening   Leg Press 75 lbs bilat press 3X10    Other Lumbar Machine Exercise Row machine and lat pull machine 25# X 30 reps ea      Lumbar Exercises: Standing   Lifting Weights (lbs) 10 lb KB 2 sets of 10, deadlift from floor    Other Standing Lumbar Exercises diagonal lifts 5 lb X 20 reps bilat, suitcase carry 10 lbs one lap ea side      Lumbar Exercises: Seated   Other  Seated Lumbar Exercises stability ball rollouts 10x 10 seconds    Other Seated Lumbar Exercises stability ball marching then LAQ 15x each side with 3# ankle wts no UE                    PT Short Term Goals - 01/03/21 1158      PT SHORT TERM GOAL #1   Title Patient will be compliant and independent with HEP    Baseline HEP given    Time 2    Period Weeks    Status Achieved    Target Date 12/26/20             PT Long Term Goals - 01/10/21 1120      PT LONG TERM GOAL #1   Title Patient will improve FOTO score    Baseline functional intake score 59%,  today was 64% predicted 68%    Time 4    Period Weeks    Status On-going      PT LONG TERM GOAL #2   Title Patient will improve all LE strength to 5/5    Baseline knee flexion 4+/5    Time 4    Period Weeks    Status On-going      PT LONG TERM GOAL #3   Title Patient will report less than 2/10 pain upon awakening in the morning    Baseline this is improving but pain can be 5 or so still  upon rising    Time 4    Period Weeks    Status On-going                 Plan - 01/10/21 1141    Clinical Impression Statement He has made progress with lumbar ROM, leg strength and overall decreased back pain. He has met 1/3 long term goals but has made progress toward his unmet goals. PT and MD recommending extending PT 4-5 more weeks for 1 time per week to continue to work toward his functional unmet goals.    Personal Factors and Comorbidities Age;Comorbidity 1    Examination-Activity Limitations Sleep    Stability/Clinical Decision Making Stable/Uncomplicated    Rehab Potential Excellent    PT Frequency 1x / week    PT Duration 4 weeks    PT Treatment/Interventions ADLs/Self Care Home Management;Cryotherapy;Stair training;Functional mobility training;Therapeutic activities;Therapeutic exercise;Balance training;Neuromuscular re-education    PT Next Visit Plan update  for advanced HEP continue with strength and  flexion based stretching    PT Home Exercise Plan Access code: [email or web address]    Consulted and Agree with Plan of Care Patient           Patient will benefit from skilled therapeutic intervention in order to improve the following deficits and impairments:  Decreased balance,Decreased strength,Pain  Visit Diagnosis: Chronic bilateral low back pain without sciatica  Muscle weakness (generalized)  Other abnormalities of gait and mobility     Problem List Patient Active Problem List   Diagnosis Date Noted  . S/P mitral valve repair 07/01/2018  . OSA (obstructive sleep apnea) 07/01/2018  . GERD (gastroesophageal reflux disease) 07/01/2018  . Hyperlipidemia 07/01/2018  . Erectile dysfunction 07/01/2018  . Hypothyroid 07/01/2018    Debbe Odea, PT,DPT 01/10/2021, 12:56 PM  [facility] [address] [place], [place], [address] Phone: [phone]   Fax:  [phone]  Name: Dustin [name] MRN: [id number] Date of Birth: 1951/01/20

## 2021-01-17 ENCOUNTER — Other Ambulatory Visit: Payer: Self-pay

## 2021-01-17 ENCOUNTER — Ambulatory Visit: Payer: Medicare HMO | Admitting: Physical Therapy

## 2021-01-17 ENCOUNTER — Encounter: Payer: Self-pay | Admitting: Physical Therapy

## 2021-01-17 DIAGNOSIS — G8929 Other chronic pain: Secondary | ICD-10-CM | POA: Diagnosis not present

## 2021-01-17 DIAGNOSIS — M545 Low back pain, unspecified: Secondary | ICD-10-CM | POA: Diagnosis not present

## 2021-01-17 DIAGNOSIS — M6281 Muscle weakness (generalized): Secondary | ICD-10-CM

## 2021-01-17 DIAGNOSIS — R2689 Other abnormalities of gait and mobility: Secondary | ICD-10-CM

## 2021-01-17 NOTE — Therapy (Signed)
[facility] [address] [place], [place], [address] Phone: [phone]   Fax:  [phone]  Physical Therapy Treatment  Patient Details  Name: Dustin [name] MRN: [id number] Date of Birth: 1951/01/20 Referring Provider (PT): Lavada Mesi, MD   Encounter Date: 01/17/2021   PT End of Session - 01/17/21 1056    Visit Number 5    Number of Visits 9    Date for PT Re-Evaluation 02/14/21    Authorization Type [employer] 12 visit authorized through 03/03/2021    Authorization - Visit Number 5    Authorization - Number of Visits 12    Progress Note Due on Visit 14    PT Start Time 1053    PT Stop Time 1140    PT Time Calculation (min) 47 min    Activity Tolerance Patient tolerated treatment well;No increased pain    Behavior During Therapy [facility] for tasks assessed/performed           History reviewed. No pertinent past medical history.  History reviewed. No pertinent surgical history.  There were no vitals filed for this visit.   Subjective Assessment - 01/17/21 1054    Subjective Patient is doing well today, he has done some of his exercises already today and is feeling fine with no pain to report.    Limitations Other (comment)   lying down   How long can you sit comfortably? long period of time    How long can you stand comfortably? as long as necessary with stretching to break it up    How long can you walk comfortably? has fatigue with walking and stops and stretches when needed but can walk over 15,000 steps    Diagnostic tests MRI shows severe spinal stenosis (narrowing of the spinal canal) at the L4-5 level, even worse than it was in 2012    Patient Stated Goals hopes to decrease pain and get opinion on his stretching/exercise routine    Currently in Pain? No/denies    Pain Onset More than a month ago            [facility] Adult PT Treatment/Exercise - 01/17/21 0001      Lumbar Exercises: Aerobic   Recumbent Bike L8 X 10 min      Lumbar  Exercises: Machines for Strengthening   Leg Press 75 lbs bilat press 3X10      Lumbar Exercises: Standing   Other Standing Lumbar Exercises stability ball plank hold 30 sec x4      Lumbar Exercises: Seated   Sit to Stand 20 reps   with stability ball lifting OH   Other Seated Lumbar Exercises stability ball rollouts 10x 10 seconds    Other Seated Lumbar Exercises stability ball rows and extensions blue tband 3x10 each, marching 20x with 2lbs akle weights, hamstring curl with blue band 20x each leg      Lumbar Exercises: Supine   Bent Knee Raise 20 reps;3 seconds   10x with cues for lumbar stabilization and 10x with red band iso hold with ressitance from above head                 PT Education - 01/17/21 1243    Education Details Patient ed on high arches and suitable shoes for walking    Person(s) Educated Patient    Methods Explanation    Comprehension Verbalized understanding            PT Short Term Goals - 01/03/21 1158  PT SHORT TERM GOAL #1   Title Patient will be compliant and independent with HEP    Baseline HEP given    Time 2    Period Weeks    Status Achieved    Target Date 12/26/20             PT Long Term Goals - 01/10/21 1120      PT LONG TERM GOAL #1   Title Patient will improve FOTO score    Baseline functional intake score 59%,  today was 64% predicted 68%    Time 4    Period Weeks    Status On-going      PT LONG TERM GOAL #2   Title Patient will improve all LE strength to 5/5    Baseline knee flexion 4+/5    Time 4    Period Weeks    Status On-going      PT LONG TERM GOAL #3   Title Patient will report less than 2/10 pain upon awakening in the morning    Baseline this is improving but pain can be 5 or so still  upon rising    Time 4    Period Weeks    Status On-going                 Plan - 01/17/21 1241    Clinical Impression Statement Patient is making good progress toward his goals and increasing his LE strength  over time. Continue to work on pain control and additional LE strength for eased function without pain.    Personal Factors and Comorbidities Age;Comorbidity 1    Examination-Activity Limitations Sleep    Stability/Clinical Decision Making Stable/Uncomplicated    Rehab Potential Excellent    PT Frequency 1x / week    PT Duration 4 weeks    PT Treatment/Interventions ADLs/Self Care Home Management;Cryotherapy;Stair training;Functional mobility training;Therapeutic activities;Therapeutic exercise;Balance training;Neuromuscular re-education    PT Next Visit Plan update for advanced HEP continue with strength and flexion based stretching    PT Home Exercise Plan Access code: [email or web address]    Consulted and Agree with Plan of Care Patient           Patient will benefit from skilled therapeutic intervention in order to improve the following deficits and impairments:  Decreased balance,Decreased strength,Pain  Visit Diagnosis: Chronic bilateral low back pain without sciatica  Muscle weakness (generalized)  Other abnormalities of gait and mobility  Chronic low back pain, unspecified back pain laterality, unspecified whether sciatica present     Problem List Patient Active Problem List   Diagnosis Date Noted  . S/P mitral valve repair 07/01/2018  . OSA (obstructive sleep apnea) 07/01/2018  . GERD (gastroesophageal reflux disease) 07/01/2018  . Hyperlipidemia 07/01/2018  . Erectile dysfunction 07/01/2018  . Hypothyroid 07/01/2018    [name], SPT 01/17/2021, 12:43 PM   During this treatment session, this physical therapist was present, participating in and directing the treatment.   This note has been reviewed and this clinician agrees with the information provided.  [name], PT, DPT 01/17/21 3:24 PM   [facility] [address] [place], [place], [address] Phone: [phone]   Fax:  [phone]  Name: Dustin [name] MRN:  [id number] Date of Birth: Nov 16, 1951

## 2021-01-25 ENCOUNTER — Ambulatory Visit: Payer: Medicare HMO | Admitting: Physical Therapy

## 2021-01-25 DIAGNOSIS — M6281 Muscle weakness (generalized): Secondary | ICD-10-CM

## 2021-01-25 DIAGNOSIS — G8929 Other chronic pain: Secondary | ICD-10-CM | POA: Diagnosis not present

## 2021-01-25 DIAGNOSIS — R2689 Other abnormalities of gait and mobility: Secondary | ICD-10-CM | POA: Diagnosis not present

## 2021-01-25 DIAGNOSIS — M545 Low back pain, unspecified: Secondary | ICD-10-CM

## 2021-01-25 NOTE — Therapy (Signed)
[facility] [address] [place], [place], [address] Phone: [phone]   Fax:  [phone]  Physical Therapy Treatment  Patient Details  Name: Dustin [name] MRN: [id number] Date of Birth: Jun 14, 1951 Referring Provider (PT): Lavada Mesi, MD   Encounter Date: 01/25/2021   PT End of Session - 01/25/21 1524    Visit Number 6    Number of Visits 9    Date for PT Re-Evaluation 02/14/21    Authorization Type [employer] 12 visit authorized through 03/03/2021    Authorization - Visit Number 6    Authorization - Number of Visits 12    Progress Note Due on Visit 14    PT Start Time 1430    PT Stop Time 1513    PT Time Calculation (min) 43 min    Activity Tolerance Patient tolerated treatment well;No increased pain    Behavior During Therapy [facility] for tasks assessed/performed           No past medical history on file.  No past surgical history on file.  There were no vitals filed for this visit.   Subjective Assessment - 01/25/21 1445    Subjective Patient is doing well today overall but has a little more pain on his left side but does not rate intesity of pain numerically    Limitations Other (comment)   lying down   How long can you sit comfortably? long period of time    How long can you stand comfortably? as long as necessary with stretching to break it up    How long can you walk comfortably? has fatigue with walking and stops and stretches when needed but can walk over 15,000 steps    Diagnostic tests MRI shows severe spinal stenosis (narrowing of the spinal canal) at the L4-5 level, even worse than it was in 2012    Patient Stated Goals hopes to decrease pain and get opinion on his stretching/exercise routine    Pain Onset More than a month ago             [facility] Adult PT Treatment/Exercise - 01/25/21 0001      Lumbar Exercises: Aerobic   Recumbent Bike L8 X 10 min      Lumbar Exercises: Machines for Strengthening   Cybex Knee Flexion  bilat pull 45 lbs 3X10    Leg Press 87 lbs bilat press 3X10      Lumbar Exercises: Standing   Lifting Weights (lbs) 15 lb KB 2 sets of 10, deadlift from floor    Other Standing Lumbar Exercises suitcase carry 15 lbs one lap ea side      Lumbar Exercises: Seated   Sit to Stand Limitations 2 sets of 10 holding 15 lbs    Other Seated Lumbar Exercises stability ball rollouts 10x 10 seconds    Other Seated Lumbar Exercises stability ball rows 2X15 with blue and extensions blue tband 2X10           PT Short Term Goals - 01/03/21 1158      PT SHORT TERM GOAL #1   Title Patient will be compliant and independent with HEP    Baseline HEP given    Time 2    Period Weeks    Status Achieved    Target Date 12/26/20             PT Long Term Goals - 01/10/21 1120      PT LONG TERM GOAL #1   Title Patient will improve  FOTO score    Baseline functional intake score 59%,  today was 64% predicted 68%    Time 4    Period Weeks    Status On-going      PT LONG TERM GOAL #2   Title Patient will improve all LE strength to 5/5    Baseline knee flexion 4+/5    Time 4    Period Weeks    Status On-going      PT LONG TERM GOAL #3   Title Patient will report less than 2/10 pain upon awakening in the morning    Baseline this is improving but pain can be 5 or so still  upon rising    Time 4    Period Weeks    Status On-going                 Plan - 01/25/21 1524    Clinical Impression Statement He had good tolerance to overall strenght progression today and continues to improve his funciton with gradual exercise progression.    Personal Factors and Comorbidities Age;Comorbidity 1    Examination-Activity Limitations Sleep    Stability/Clinical Decision Making Stable/Uncomplicated    Rehab Potential Excellent    PT Frequency 1x / week    PT Duration 4 weeks    PT Treatment/Interventions ADLs/Self Care Home Management;Cryotherapy;Stair training;Functional mobility  training;Therapeutic activities;Therapeutic exercise;Balance training;Neuromuscular re-education    PT Next Visit Plan update for advanced HEP continue with strength and flexion based stretching    PT Home Exercise Plan Access code: [email or web address]    Consulted and Agree with Plan of Care Patient           Patient will benefit from skilled therapeutic intervention in order to improve the following deficits and impairments:  Decreased balance,Decreased strength,Pain  Visit Diagnosis: Chronic bilateral low back pain without sciatica  Muscle weakness (generalized)  Other abnormalities of gait and mobility  Chronic low back pain, unspecified back pain laterality, unspecified whether sciatica present     Problem List Patient Active Problem List   Diagnosis Date Noted  . S/P mitral valve repair 07/01/2018  . OSA (obstructive sleep apnea) 07/01/2018  . GERD (gastroesophageal reflux disease) 07/01/2018  . Hyperlipidemia 07/01/2018  . Erectile dysfunction 07/01/2018  . Hypothyroid 07/01/2018    Dustin [name] 01/25/2021, 3:25 PM  [facility] Therapy [address] [place], [place], [address] Phone: [phone]   Fax:  [phone]  Name: CEJAY CAMBRE MRN: [id number] Date of Birth: [birth year]

## 2021-02-01 ENCOUNTER — Ambulatory Visit: Payer: Medicare HMO | Admitting: Physical Therapy

## 2021-02-01 ENCOUNTER — Other Ambulatory Visit: Payer: Self-pay

## 2021-02-01 DIAGNOSIS — M545 Low back pain, unspecified: Secondary | ICD-10-CM

## 2021-02-01 DIAGNOSIS — M6281 Muscle weakness (generalized): Secondary | ICD-10-CM

## 2021-02-01 DIAGNOSIS — G8929 Other chronic pain: Secondary | ICD-10-CM

## 2021-02-01 DIAGNOSIS — R2689 Other abnormalities of gait and mobility: Secondary | ICD-10-CM | POA: Diagnosis not present

## 2021-02-01 NOTE — Therapy (Signed)
[facility] Physical Therapy [address] [place], [place], [address] Phone: [phone]   Fax:  [phone]  Physical Therapy Treatment  Patient Details  Name: Dustin [name] MRN: [id number] Date of Birth: May 09, 1951 Referring Provider (PT): Lavada Mesi, MD   Encounter Date: 02/01/2021   PT End of Session - 02/01/21 1539    Visit Number 7    Number of Visits 9    Date for PT Re-Evaluation 03/03/21    Authorization Type [employer] 12 visit authorized through 03/03/2021    Authorization - Visit Number 7    Authorization - Number of Visits 12    Progress Note Due on Visit 14    PT Start Time 1432    PT Stop Time 1517    PT Time Calculation (min) 45 min    Activity Tolerance Patient tolerated treatment well;No increased pain    Behavior During Therapy [facility] for tasks assessed/performed           No past medical history on file.  No past surgical history on file.  There were no vitals filed for this visit.   Subjective Assessment - 02/01/21 1536    Subjective Patient is doing well, back feels about the same, no new complaints but still with pain at times    Limitations Other (comment)   lying down   How long can you sit comfortably? long period of time    How long can you stand comfortably? as long as necessary with stretching to break it up    How long can you walk comfortably? has fatigue with walking and stops and stretches when needed but can walk over 15,000 steps    Diagnostic tests MRI shows severe spinal stenosis (narrowing of the spinal canal) at the L4-5 level, even worse than it was in 2012    Patient Stated Goals hopes to decrease pain and get opinion on his stretching/exercise routine    Pain Onset More than a month ago             [facility] Adult PT Treatment/Exercise - 02/01/21 0001      Lumbar Exercises: Stretches   Double Knee to Chest Stretch 10 seconds    Double Knee to Chest Stretch Limitations 10 reps with feet on ball    Other Lumbar  Stretch Exercise seated lumbar flexion pball roll outs 10 sec X 10      Lumbar Exercises: Aerobic   Recumbent Bike L8 X 10 min      Lumbar Exercises: Machines for Strengthening   Cybex Knee Flexion --    Leg Press --      Lumbar Exercises: Standing   Lifting Weights (lbs) 15 lb KB 2 sets of 10, deadlift from floor    Other Standing Lumbar Exercises suitcase carry 15 lbs one lap ea side      Lumbar Exercises: Seated   Sit to Stand Limitations 2 sets of 10 holding 15 lbs    Other Seated Lumbar Exercises stability ball rows 2X15 with blue and extensions blue tband 2X10      Lumbar Exercises: Supine   Dead Bug 10 reps    Dead Bug Limitations with pball holding 5 sec    Bridge Limitations 2 sets of 10 holding 5 sec with feet on pball                    PT Short Term Goals - 01/03/21 1158      PT SHORT TERM GOAL #1  Title Patient will be compliant and independent with HEP    Baseline HEP given    Time 2    Period Weeks    Status Achieved    Target Date 12/26/20             PT Long Term Goals - 01/10/21 1120      PT LONG TERM GOAL #1   Title Patient will improve FOTO score    Baseline functional intake score 59%,  today was 64% predicted 68%    Time 4    Period Weeks    Status On-going      PT LONG TERM GOAL #2   Title Patient will improve all LE strength to 5/5    Baseline knee flexion 4+/5    Time 4    Period Weeks    Status On-going      PT LONG TERM GOAL #3   Title Patient will report less than 2/10 pain upon awakening in the morning    Baseline this is improving but pain can be 5 or so still  upon rising    Time 4    Period Weeks    Status On-going                 Plan - 02/01/21 1600    Clinical Impression Statement Continued with functional strengthening, core strength, and flexion based lumbar stretching today with good overall tolerance. We will continue to progress this as able toward his funcitonal goals.    Personal Factors and  Comorbidities Age;Comorbidity 1    Examination-Activity Limitations Sleep    Stability/Clinical Decision Making Stable/Uncomplicated    Rehab Potential Excellent    PT Frequency 1x / week    PT Duration 4 weeks    PT Treatment/Interventions ADLs/Self Care Home Management;Cryotherapy;Stair training;Functional mobility training;Therapeutic activities;Therapeutic exercise;Balance training;Neuromuscular re-education    PT Next Visit Plan update for advanced HEP continue with strength and flexion based stretching    PT Home Exercise Plan Access code: [email or web address]    Consulted and Agree with Plan of Care Patient           Patient will benefit from skilled therapeutic intervention in order to improve the following deficits and impairments:  Decreased balance,Decreased strength,Pain  Visit Diagnosis: Chronic bilateral low back pain without sciatica  Muscle weakness (generalized)  Other abnormalities of gait and mobility  Chronic low back pain, unspecified back pain laterality, unspecified whether sciatica present     Problem List Patient Active Problem List   Diagnosis Date Noted  . S/P mitral valve repair 07/01/2018  . OSA (obstructive sleep apnea) 07/01/2018  . GERD (gastroesophageal reflux disease) 07/01/2018  . Hyperlipidemia 07/01/2018  . Erectile dysfunction 07/01/2018  . Hypothyroid 07/01/2018    April Manson, PT,DPT 02/01/2021, 4:01 PM  [facility] Physical Therapy [address] [place], [place], [address] Phone: [phone]   Fax:  [phone]  Name: Dustin [name] MRN: [id number] Date of Birth: 1951-06-11

## 2021-02-15 ENCOUNTER — Other Ambulatory Visit: Payer: Self-pay

## 2021-02-15 ENCOUNTER — Ambulatory Visit: Payer: Medicare HMO | Admitting: Physical Therapy

## 2021-02-15 DIAGNOSIS — M6281 Muscle weakness (generalized): Secondary | ICD-10-CM | POA: Diagnosis not present

## 2021-02-15 DIAGNOSIS — R2689 Other abnormalities of gait and mobility: Secondary | ICD-10-CM | POA: Diagnosis not present

## 2021-02-15 DIAGNOSIS — M545 Low back pain, unspecified: Secondary | ICD-10-CM

## 2021-02-15 DIAGNOSIS — G8929 Other chronic pain: Secondary | ICD-10-CM

## 2021-02-15 NOTE — Therapy (Signed)
[facility] Physical Therapy [address] [place], [place], [address] Phone: [phone]   Fax:  [phone]  Physical Therapy Treatment  Patient Details  Name: Dustin [name] MRN: [id number] Date of Birth: October 11, 1951 Referring Provider (PT): Lavada Mesi, MD   Encounter Date: 02/15/2021   PT End of Session - 02/15/21 1525    Visit Number 8    Number of Visits 9    Date for PT Re-Evaluation 03/03/21    Authorization Type [employer] 12 visit authorized through 03/03/2021    Authorization - Visit Number 8    Authorization - Number of Visits 12    Progress Note Due on Visit 14    PT Start Time 1431    PT Stop Time 1517    PT Time Calculation (min) 46 min    Activity Tolerance Patient tolerated treatment well;No increased pain    Behavior During Therapy [facility] for tasks assessed/performed           No past medical history on file.  No past surgical history on file.  There were no vitals filed for this visit.   Subjective Assessment - 02/15/21 1438    Subjective He states he has good days and bad days with his overall LBP, a little more pain and stiffness as he started leg press and leg ext machines at the [facility].    Limitations Other (comment)   lying down   How long can you sit comfortably? long period of time    How long can you stand comfortably? as long as necessary with stretching to break it up    How long can you walk comfortably? has fatigue with walking and stops and stretches when needed but can walk over 15,000 steps    Diagnostic tests MRI shows severe spinal stenosis (narrowing of the spinal canal) at the L4-5 level, even worse than it was in 2012    Patient Stated Goals hopes to decrease pain and get opinion on his stretching/exercise routine    Pain Onset More than a month ago                             [facility] Adult PT Treatment/Exercise - 02/15/21 0001      Lumbar Exercises: Stretches   Double Knee to Chest Stretch 10 seconds     Double Knee to Chest Stretch Limitations 10 reps with feet on ball    Other Lumbar Stretch Exercise seated lumbar flexion pball roll outs 10 sec X 10      Lumbar Exercises: Aerobic   UBE (Upper Arm Bike) L6 UE/LE X 10 min      Lumbar Exercises: Standing   Lifting Weights (lbs) 20 lb KB 2 sets of 10, deadlift from floor    Other Standing Lumbar Exercises suitcase carry 20 lbs one lap ea side      Lumbar Exercises: Seated   Other Seated Lumbar Exercises diagonal lifts 5lbs  X15 bilat seated on pball    Other Seated Lumbar Exercises stability ball rows 2X15 with blue and extensions blue tband 2X10      Lumbar Exercises: Supine   Dead Bug 10 reps    Dead Bug Limitations with pball holding 5 sec    Bridge Limitations 2 sets of 10 holding 5 sec with feet on pball                    PT Short Term Goals - 01/03/21  1158      PT SHORT TERM GOAL #1   Title Patient will be compliant and independent with HEP    Baseline HEP given    Time 2    Period Weeks    Status Achieved    Target Date 12/26/20             PT Long Term Goals - 01/10/21 1120      PT LONG TERM GOAL #1   Title Patient will improve FOTO score    Baseline functional intake score 59%,  today was 64% predicted 68%    Time 4    Period Weeks    Status On-going      PT LONG TERM GOAL #2   Title Patient will improve all LE strength to 5/5    Baseline knee flexion 4+/5    Time 4    Period Weeks    Status On-going      PT LONG TERM GOAL #3   Title Patient will report less than 2/10 pain upon awakening in the morning    Baseline this is improving but pain can be 5 or so still  upon rising    Time 4    Period Weeks    Status On-going                 Plan - 02/15/21 1526    Clinical Impression Statement He was able to progress resistance today with deadlifts and carries today with good overall tolerance. He has one visit left scheduled on current POC so will reasses the need to continue or DC  next visit.    Personal Factors and Comorbidities Age;Comorbidity 1    Examination-Activity Limitations Sleep    Stability/Clinical Decision Making Stable/Uncomplicated    Rehab Potential Excellent    PT Frequency 1x / week    PT Duration 4 weeks    PT Treatment/Interventions ADLs/Self Care Home Management;Cryotherapy;Stair training;Functional mobility training;Therapeutic activities;Therapeutic exercise;Balance training;Neuromuscular re-education    PT Next Visit Plan DC vs recert    PT Home Exercise Plan Access code: [email or web address]    Consulted and Agree with Plan of Care Patient           Patient will benefit from skilled therapeutic intervention in order to improve the following deficits and impairments:  Decreased balance,Decreased strength,Pain  Visit Diagnosis: Chronic bilateral low back pain without sciatica  Muscle weakness (generalized)  Other abnormalities of gait and mobility  Chronic low back pain, unspecified back pain laterality, unspecified whether sciatica present     Problem List Patient Active Problem List   Diagnosis Date Noted  . S/P mitral valve repair 07/01/2018  . OSA (obstructive sleep apnea) 07/01/2018  . GERD (gastroesophageal reflux disease) 07/01/2018  . Hyperlipidemia 07/01/2018  . Erectile dysfunction 07/01/2018  . Hypothyroid 07/01/2018    Birdie Riddle 02/15/2021, 3:28 PM  [facility] Physical Therapy [address] [place], [place], [address] Phone: [phone]   Fax:  [phone]  Name: Dustin [name] MRN: [id number] Date of Birth: February 01, 1951

## 2021-02-22 ENCOUNTER — Ambulatory Visit (INDEPENDENT_AMBULATORY_CARE_PROVIDER_SITE_OTHER): Payer: Medicare HMO | Admitting: Physical Therapy

## 2021-02-22 ENCOUNTER — Other Ambulatory Visit: Payer: Self-pay

## 2021-02-22 DIAGNOSIS — M545 Low back pain, unspecified: Secondary | ICD-10-CM

## 2021-02-22 DIAGNOSIS — R2689 Other abnormalities of gait and mobility: Secondary | ICD-10-CM

## 2021-02-22 DIAGNOSIS — G8929 Other chronic pain: Secondary | ICD-10-CM | POA: Diagnosis not present

## 2021-02-22 DIAGNOSIS — M6281 Muscle weakness (generalized): Secondary | ICD-10-CM

## 2021-02-22 NOTE — Therapy (Signed)
[facility] Physical Therapy [address] [place], [place], [address] Phone: [phone]   Fax:  [phone]  Physical Therapy Treatment/Discharge PHYSICAL THERAPY DISCHARGE SUMMARY  Visits from Start of Care: 9  Current functional level related to goals / functional outcomes: Back to baseline   Remaining deficits: Back to baseline   Education / Equipment: HEP  Plan: Patient agrees to discharge.  Patient goals were met. Patient is being discharged due to meeting the stated rehab goals.  ?????        Patient Details  Name: Dustin [name] MRN: [id number] Date of Birth: 1951-02-03 Referring Provider (PT): Eunice Blase, MD   Encounter Date: 02/22/2021   PT End of Session - 02/22/21 1448    Visit Number 9    Number of Visits 9    Date for PT Re-Evaluation 03/03/21    Authorization Type [employer] 12 visit authorized through 03/03/2021    Authorization - Visit Number 9    Authorization - Number of Visits 12    Progress Note Due on Visit 14    PT Start Time 1430    PT Stop Time 1508    PT Time Calculation (min) 38 min    Activity Tolerance Patient tolerated treatment well;No increased pain    Behavior During Therapy [facility] for tasks assessed/performed           No past medical history on file.  No past surgical history on file.  There were no vitals filed for this visit.   Subjective Assessment - 02/22/21 1443    Subjective He states his back is feeling good, he was able to bike 60 minutes and walk 30 minutes before session today without back pain, he feels ready for discharge.    Limitations Other (comment)   lying down   How long can you sit comfortably? long period of time    How long can you stand comfortably? as long as necessary with stretching to break it up    How long can you walk comfortably? has fatigue with walking and stops and stretches when needed but can walk over 15,000 steps    Diagnostic tests MRI shows severe spinal stenosis  (narrowing of the spinal canal) at the L4-5 level, even worse than it was in 2012    Patient Stated Goals hopes to decrease pain and get opinion on his stretching/exercise routine    Pain Onset More than a month ago                             [facility] Adult PT Treatment/Exercise - 02/22/21 0001      Lumbar Exercises: Stretches   Double Knee to Chest Stretch 10 seconds    Double Knee to Chest Stretch Limitations 10 reps with feet on ball    Other Lumbar Stretch Exercise seated lumbar flexion pball roll outs 10 sec X 10      Lumbar Exercises: Standing   Lifting Weights (lbs) 20 lb KB 2 sets of 10, deadlift from floor    Other Standing Lumbar Exercises suitcase carry 20 lbs one lap ea side      Lumbar Exercises: Seated   Other Seated Lumbar Exercises stability ball rows 2X15 with blue and extensions blue tband 2X10      Lumbar Exercises: Supine   Dead Bug 10 reps    Dead Bug Limitations with pball holding 5 sec    Bridge Limitations 2 sets of 10 holding 5  sec with feet on pball                    PT Short Term Goals - 01/03/21 1158      PT SHORT TERM GOAL #1   Title Patient will be compliant and independent with HEP    Baseline HEP given    Time 2    Period Weeks    Status Achieved    Target Date 12/26/20             PT Long Term Goals - 02/22/21 1451      PT LONG TERM GOAL #1   Title Patient will improve FOTO score    Baseline 83 today    Time 4    Period Weeks    Status Achieved      PT LONG TERM GOAL #2   Title Patient will improve all LE strength to 5/5    Baseline 5/5 today    Time 4    Period Weeks    Status Achieved      PT LONG TERM GOAL #3   Title Patient will report less than 2/10 pain upon awakening in the morning    Baseline now met    Time 4    Period Weeks    Status Achieved                 Plan - 02/22/21 1450    Clinical Impression Statement He has met all PT goals and is not having as much back pain  overall. He has been compliant with HEP and is working back out at the gym without complaints. He will be discharged today and he had no futher questions or concerns.    Personal Factors and Comorbidities Age;Comorbidity 1    Examination-Activity Limitations Sleep    Stability/Clinical Decision Making Stable/Uncomplicated    Rehab Potential Excellent    PT Frequency 1x / week    PT Duration 4 weeks    PT Treatment/Interventions ADLs/Self Care Home Management;Cryotherapy;Stair training;Functional mobility training;Therapeutic activities;Therapeutic exercise;Balance training;Neuromuscular re-education    PT Next Visit Plan DC    PT Home Exercise Plan Access code: [email or web address]    Consulted and Agree with Plan of Care Patient           Patient will benefit from skilled therapeutic intervention in order to improve the following deficits and impairments:  Decreased balance,Decreased strength,Pain  Visit Diagnosis: Chronic bilateral low back pain without sciatica  Muscle weakness (generalized)  Other abnormalities of gait and mobility  Chronic low back pain, unspecified back pain laterality, unspecified whether sciatica present     Problem List Patient Active Problem List   Diagnosis Date Noted  . S/P mitral valve repair 07/01/2018  . OSA (obstructive sleep apnea) 07/01/2018  . GERD (gastroesophageal reflux disease) 07/01/2018  . Hyperlipidemia 07/01/2018  . Erectile dysfunction 07/01/2018  . Hypothyroid 07/01/2018    Silvestre Mesi 02/22/2021, 3:04 PM  [facility] Physical Therapy [address] [place], [place], [address] Phone: [phone]   Fax:  [phone]  Name: Dustin [name] MRN: [id number] Date of Birth: 09-02-1951

## 2021-08-15 ENCOUNTER — Ambulatory Visit: Payer: Medicare HMO | Admitting: Orthopaedic Surgery

## 2021-08-15 ENCOUNTER — Other Ambulatory Visit: Payer: Self-pay

## 2021-08-15 DIAGNOSIS — M48062 Spinal stenosis, lumbar region with neurogenic claudication: Secondary | ICD-10-CM

## 2021-08-15 NOTE — Progress Notes (Signed)
Office Visit Note   Patient: Dustin [name]           Date of Birth: [birth year]           MRN: [id number] Visit Date: 08/15/2021              Requested by: [place] [address] [place],  [place] [address] PCP: Selina Cooley, MD   Assessment & Plan: Visit Diagnoses:  1. Spinal stenosis of lumbar region with neurogenic claudication     Plan: Patient has single level stenosis with anterolisthesis degenerative in nature with intact pars.  He did need decompression of single level fusion at L4-5.  He can discuss with his family members and can return in 28months.  He states he like to have it in wintertime.  Follow-Up Instructions: Return in about 2 months (around 10/15/2021).   Orders:  No orders of the defined types were placed in this encounter.  No orders of the defined types were placed in this encounter.     Procedures: No procedures performed   Clinical Data: No additional findings.   Subjective: Chief Complaint  Patient presents with   Lower Back - Pain    HPI 70 year old patient with chronic back pain occasional numbness in the feet right and left.  Weakness both legs with ambulation and prolonged standing.  Progression over the last 6-8 8 months.  Relief with sitting or lying flat.  He is noted some improvement with ibuprofen but was told he should not take it due to his kidneys by his PCP.  He has used Tylenol.  He has been through physical therapy doing a home exercise program and stretching.  MRI scan shows severe multifactorial stenosis with neural compression worse since 2012 MRI with 11 mm of anterolisthesis and disc bulging.  Some mild narrowing at L2-3 and L3-4 without definite compression at those levels.  Review of Systems previous mitral valve repair pair, sleep apnea hyperlipidemia hypothyroidism GERD.  Negative for acute chest pain negative for CVA.  All other systems are noncontributory HPI.   Objective: Vital Signs: BP 114/69   Ht 5'  11" (1.803 m)   Wt 193 lb (87.5 kg)   BMI 26.92 kg/m   Physical Exam Constitutional:      Appearance: He is well-developed.  HENT:     Head: Normocephalic and atraumatic.     Right Ear: External ear normal.     Left Ear: External ear normal.  Eyes:     Pupils: Pupils are equal, round, and reactive to light.  Neck:     Thyroid: No thyromegaly.     Trachea: No tracheal deviation.  Cardiovascular:     Rate and Rhythm: Normal rate.  Pulmonary:     Effort: Pulmonary effort is normal.     Breath sounds: No wheezing.  Abdominal:     General: Bowel sounds are normal.     Palpations: Abdomen is soft.  Musculoskeletal:     Cervical back: Neck supple.  Skin:    General: Skin is warm and dry.     Capillary Refill: Capillary refill takes less than 2 seconds.  Neurological:     Mental Status: He is alert and oriented to person, place, and time.  Psychiatric:        Behavior: Behavior normal.        Thought Content: Thought content normal.        Judgment: Judgment normal.    Ortho Exam anterior tib gastrocsoleus is  active palpable pedal pulses.  Quads are strong negative logroll hips knees reach full extension.  Knee and ankle jerk are intact.  Specialty Comments:  No specialty comments available.  Imaging: CLINICAL DATA:  Low back pain.  Bilateral buttock pain.   EXAM: MRI LUMBAR SPINE WITHOUT CONTRAST   TECHNIQUE: Multiplanar, multisequence MR imaging of the lumbar spine was performed. No intravenous contrast was administered.   COMPARISON:  10/16/2011   FINDINGS: Segmentation:  5 lumbar type vertebral bodies.   Alignment: 11 mm of anterolisthesis at L4-5, increased from 7 mm on the study of 20/12.   Vertebrae: No fracture or primary bone lesion. Chronic discogenic endplate marrow changes at T11-12, L2-3 and L4-5. No active edematous change. Insignificant hemangioma within the right posterior corner of the T11 vertebral body.   Conus medullaris and cauda equina:  Conus extends to the T12-L1 level. Conus and cauda equina appear normal.   Paraspinal and other soft tissues: Negative   Disc levels:   T11-12: Disc degeneration with mild disc bulge. No compressive stenosis.   T12-L1 and L1-2: Normal.   L2-3: Desiccation and moderate bulging of the disc. Mild facet and ligamentous prominence. Mild stenosis of both lateral recesses and foramina but without definite neural compression. Slight worsening since 2012.   L3-4: Moderate bulging of the disc. Mild facet and ligamentous hypertrophy. Mild stenosis of the lateral recesses and foramina but without definite neural compression. Slight worsening since 2012.   L4-5: Chronic facet arthropathy, with 11 mm of anterolisthesis. Chronic disc degeneration with loss of disc height. Severe multifactorial stenosis of the canal and neural foramina likely to cause neural compression. Considerable worsening since 2012.   L5-S1: Mild bulging of the disc.  No canal or foraminal stenosis.   IMPRESSION: 1. L4-5: Severe multifactorial spinal stenosis likely to cause neural compression. Considerable worsening since 2012. Contributing factors include facet arthropathy with 11 mm of anterolisthesis and bulging of the disc. 2. L2-3 and L3-4: Moderate disc bulges. Mild facet and ligamentous hypertrophy. Mild stenosis of the lateral recesses and foramina but without definite neural compression. Slight worsening since 2012.     Electronically Signed   By: Paulina Fusi M.D.   On: 11/28/2020 15:54   PMFS History: Patient Active Problem List   Diagnosis Date Noted   Spinal stenosis of lumbar region 08/22/2021   S/P mitral valve repair 07/01/2018   OSA (obstructive sleep apnea) 07/01/2018   GERD (gastroesophageal reflux disease) 07/01/2018   Hyperlipidemia 07/01/2018   Erectile dysfunction 07/01/2018   Hypothyroid 07/01/2018   No past medical history on file.  No family history on file.  No past surgical  history on file. Social History   Occupational History   Not on file  Tobacco Use   Smoking status: Not on file   Smokeless tobacco: Not on file  Substance and Sexual Activity   Alcohol use: Not on file   Drug use: Not on file   Sexual activity: Not on file

## 2021-08-22 DIAGNOSIS — M48061 Spinal stenosis, lumbar region without neurogenic claudication: Secondary | ICD-10-CM | POA: Insufficient documentation

## 2021-08-22 HISTORY — DX: Spinal stenosis, lumbar region without neurogenic claudication: M48.061

## 2021-09-28 ENCOUNTER — Telehealth: Payer: Self-pay

## 2021-09-28 NOTE — Telephone Encounter (Signed)
Pt came into the office stating that he wants to talk about surgery at his upcoming apt with Dr. Ophelia Charter

## 2021-09-28 NOTE — Telephone Encounter (Signed)
FYI

## 2021-10-17 ENCOUNTER — Other Ambulatory Visit: Payer: Self-pay

## 2021-10-17 ENCOUNTER — Encounter: Payer: Self-pay | Admitting: Orthopaedic Surgery

## 2021-10-17 ENCOUNTER — Ambulatory Visit: Payer: Medicare HMO | Admitting: Orthopaedic Surgery

## 2021-10-17 VITALS — BP 110/57 | HR 78 | Ht 71.0 in | Wt 193.0 lb

## 2021-10-17 DIAGNOSIS — M48062 Spinal stenosis, lumbar region with neurogenic claudication: Secondary | ICD-10-CM | POA: Diagnosis not present

## 2021-10-24 NOTE — Progress Notes (Signed)
Office Visit Note   Patient: Dustin [name]           Date of Birth: [birth year]           MRN: [id number] Visit Date: 10/17/2021              Requested by: [place] [id number] [place],  [place] [address] PCP: Selina Cooley, MD   Assessment & Plan: Visit Diagnoses:  1. Spinal stenosis of lumbar region with neurogenic claudication     Plan: Patient states he is ready to proceed with decompression fusion surgery 1 level at L4-5 where he has severe stenosis and 11 mm anterolisthesis with instability on flexion-extension.  We discussed instrumented fusion risks of surgery with instrumentation.  Pseudoarthrosis, hardware loosening, reoperation.  Questions were elicited and answered.  With his previous mitral valve 2019 he need preoperative clearance.  Follow-Up Instructions: No follow-ups on file.   Orders:  No orders of the defined types were placed in this encounter.  No orders of the defined types were placed in this encounter.     Procedures: No procedures performed   Clinical Data: No additional findings.   Subjective: Chief Complaint  Patient presents with   Lower Back - Follow-up    HPI 70 year old male 87-month follow-up for lumbar spinal stenosis with neurogenic claudication symptoms at L4-5.  He had gotten some improvement with anti-inflammatories but was told to switch to Tylenol since ibuprofen was affecting his kidney test.  Patient has 11 mm of anterolisthesis with disc bulge at the L4-5 level.  He has had claudication symptoms that improved with sitting.  He is limited in standing and walking from the claudication.  He has had previous mitral valve repair.  Additional problems include GERD, sleep apnea, hyperlipidemia, hypothyroidism.  BMI 26.  Review of Systems 14 point systems updated negative for chills or fever no associated bowel or bladder symptoms.  All other systems are negative unchanged from 03/15/2021 office visit.   Objective: Vital  Signs: BP (!) 110/57   Pulse 78   Ht 5\' 11"  (1.803 m)   Wt 193 lb (87.5 kg)   BMI 26.92 kg/m   Physical Exam Constitutional:      Appearance: He is well-developed.  HENT:     Head: Normocephalic and atraumatic.     Right Ear: External ear normal.     Left Ear: External ear normal.  Eyes:     Pupils: Pupils are equal, round, and reactive to light.  Neck:     Thyroid: No thyromegaly.     Trachea: No tracheal deviation.  Cardiovascular:     Rate and Rhythm: Normal rate.  Pulmonary:     Effort: Pulmonary effort is normal.     Breath sounds: No wheezing.  Abdominal:     General: Bowel sounds are normal.     Palpations: Abdomen is soft.  Musculoskeletal:     Cervical back: Neck supple.  Skin:    General: Skin is warm and dry.     Capillary Refill: Capillary refill takes less than 2 seconds.  Neurological:     Mental Status: He is alert and oriented to person, place, and time.  Psychiatric:        Behavior: Behavior normal.        Thought Content: Thought content normal.        Judgment: Judgment normal.    Ortho Exam patient is able to ambulate no weakness lower extremities.  Negative logroll of the  hips.  Posterior tib and dorsalis pedis pulse are normal and symmetrical.  Knees reach full extension good quad strength.  Specialty Comments:  No specialty comments available.  Imaging: Imaging: CLINICAL DATA:  Low back pain.  Bilateral buttock pain.   EXAM: MRI LUMBAR SPINE WITHOUT CONTRAST   TECHNIQUE: Multiplanar, multisequence MR imaging of the lumbar spine was performed. No intravenous contrast was administered.   COMPARISON:  10/16/2011   FINDINGS: Segmentation:  5 lumbar type vertebral bodies.   Alignment: 11 mm of anterolisthesis at L4-5, increased from 7 mm on the study of 20/12.   Vertebrae: No fracture or primary bone lesion. Chronic discogenic endplate marrow changes at T11-12, L2-3 and L4-5. No active edematous change. Insignificant hemangioma  within the right posterior corner of the T11 vertebral body.   Conus medullaris and cauda equina: Conus extends to the T12-L1 level. Conus and cauda equina appear normal.   Paraspinal and other soft tissues: Negative   Disc levels:   T11-12: Disc degeneration with mild disc bulge. No compressive stenosis.   T12-L1 and L1-2: Normal.   L2-3: Desiccation and moderate bulging of the disc. Mild facet and ligamentous prominence. Mild stenosis of both lateral recesses and foramina but without definite neural compression. Slight worsening since 2012.   L3-4: Moderate bulging of the disc. Mild facet and ligamentous hypertrophy. Mild stenosis of the lateral recesses and foramina but without definite neural compression. Slight worsening since 2012.   L4-5: Chronic facet arthropathy, with 11 mm of anterolisthesis. Chronic disc degeneration with loss of disc height. Severe multifactorial stenosis of the canal and neural foramina likely to cause neural compression. Considerable worsening since 2012.   L5-S1: Mild bulging of the disc.  No canal or foraminal stenosis.   IMPRESSION: 1. L4-5: Severe multifactorial spinal stenosis likely to cause neural compression. Considerable worsening since 2012. Contributing factors include facet arthropathy with 11 mm of anterolisthesis and bulging of the disc. 2. L2-3 and L3-4: Moderate disc bulges. Mild facet and ligamentous hypertrophy. Mild stenosis of the lateral recesses and foramina but without definite neural compression. Slight worsening since 2012.     Electronically Signed   By: Paulina Fusi M.D.   On: 11/28/2020 15:54   PMFS History: Patient Active Problem List   Diagnosis Date Noted   Spinal stenosis of lumbar region 08/22/2021   S/P mitral valve repair 07/01/2018   OSA (obstructive sleep apnea) 07/01/2018   GERD (gastroesophageal reflux disease) 07/01/2018   Hyperlipidemia 07/01/2018   Erectile dysfunction 07/01/2018    Hypothyroid 07/01/2018   No past medical history on file.  No family history on file.  No past surgical history on file. Social History   Occupational History   Not on file  Tobacco Use   Smoking status: Never   Smokeless tobacco: Never  Substance and Sexual Activity   Alcohol use: Yes    Comment: wine daily   Drug use: Not on file   Sexual activity: Not on file

## 2021-11-06 ENCOUNTER — Other Ambulatory Visit: Payer: Self-pay

## 2021-11-21 ENCOUNTER — Other Ambulatory Visit: Payer: Self-pay | Admitting: Surgery

## 2021-11-22 ENCOUNTER — Other Ambulatory Visit: Payer: Self-pay

## 2021-11-22 ENCOUNTER — Ambulatory Visit (INDEPENDENT_AMBULATORY_CARE_PROVIDER_SITE_OTHER): Payer: 59 | Admitting: Surgery

## 2021-11-22 ENCOUNTER — Encounter: Payer: Self-pay | Admitting: Surgery

## 2021-11-22 DIAGNOSIS — M48062 Spinal stenosis, lumbar region with neurogenic claudication: Secondary | ICD-10-CM

## 2021-11-22 NOTE — Progress Notes (Signed)
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  States that symptoms unchanged from previous visit.  He is want to proceed with L4-5 Gill procedure and fusion with Dr. Ophelia Charter November 29, 2021.  Today history and physical performed.  Review of systems negative.  Past medical history significant for mitral valve repair with one-vessel CABG August 2018 by Dr. Nevada Crane.  Dr. Ophelia Charter did not request cardiac clearance.  Patient is followed by cardiologist Dr. Pete Pelt with [facility] and was last seen by him August 04, 2021.  Plan Surgical procedure discussed along with potential hospital stay.  I asked our surgery scheduler to send a cardiac clearance form to Dr. Jodie Echevaria.  Hopefully we can get this back within the next couple of days without any further work-up.  All questions answered.

## 2021-11-22 NOTE — Progress Notes (Signed)
Surgical Instructions    Your procedure is scheduled on 11/29/20.  Report to [facility] Main Entrance "A" at 10:30 A.M., then check in with the Admitting office.  Call this number if you have problems the morning of surgery:  [phone]   If you have any questions prior to your surgery date call [phone]: Open Monday-Friday 8am-4pm    Remember:  Do not eat after midnight the night before your surgery  You may drink clear liquids until 9:30 the morning of your surgery.   Clear liquids allowed are: Water, Non-Citrus Juices (without pulp), Carbonated Beverages, Clear Tea, Black Coffee ONLY (NO MILK, CREAM OR POWDERED CREAMER of any kind), and Gatorade  Please complete your PRE-SURGERY ENSURE that was provided to you by 9:30 the morning of surgery.  Please, if able, drink it in one setting. DO NOT SIP.     Take these medicines the morning of surgery with A SIP OF WATER:  atorvastatin (LIPITOR) esomeprazole (NEXIUM) fluticasone (FLONASE) levothyroxine (SYNTHROID, LEVOTHROID) metoprolol tartrate (LOPRESSOR) acetaminophen (TYLENOL) - if needed   As of today, STOP taking any Aspirin (unless otherwise instructed by your surgeon) Aleve, Naproxen, Ibuprofen, Motrin, Advil, Goody's, BC's, all herbal medications, fish oil, and all vitamins.   After your COVID test   You are not required to quarantine however you are required to wear a well-fitting mask when you are out and around people not in your household.  If your mask becomes wet or soiled, replace with a new one.  Wash your hands often with soap and water for 20 seconds or clean your hands with an alcohol-based hand sanitizer that contains at least 60% alcohol.  Do not share personal items.  Notify your provider: if you are in close contact with someone who has COVID  or if you develop a fever of 100.4 or greater, sneezing, cough, sore throat, shortness of breath or body aches.         Do not wear jewelry  Do not wear  lotions, powders, colognes, or deodorant. Do not shave 48 hours prior to surgery.  Men may shave face and neck. Do not bring valuables to the hospital.              [facility] is not responsible for any belongings or valuables.  Do NOT Smoke (Tobacco/Vaping)  24 hours prior to your procedure  If you use a CPAP at night, you may bring your mask for your overnight stay.   Contacts, glasses, hearing aids, dentures or partials may not be worn into surgery, please bring cases for these belongings   For patients admitted to the hospital, discharge time will be determined by your treatment team.   Patients discharged the day of surgery will not be allowed to drive home, and someone needs to stay with them for 24 hours.  NO VISITORS WILL BE ALLOWED IN PRE-OP WHERE PATIENTS ARE PREPPED FOR SURGERY.  ONLY 1 SUPPORT PERSON MAY BE PRESENT IN THE WAITING ROOM WHILE YOU ARE IN SURGERY.  IF YOU ARE TO BE ADMITTED, ONCE YOU ARE IN YOUR ROOM YOU WILL BE ALLOWED TWO (2) VISITORS. 1 (ONE) VISITOR MAY STAY OVERNIGHT BUT MUST ARRIVE TO THE ROOM BY 8pm.  Minor children may have two parents present. Special consideration for safety and communication needs will be reviewed on a case by case basis.  Special instructions:    Oral Hygiene is also important to reduce your risk of infection.  Remember - BRUSH YOUR TEETH THE MORNING OF  SURGERY WITH YOUR REGULAR TOOTHPASTE   [facility]- Preparing For Surgery  Before surgery, you can play an important role. Because skin is not sterile, your skin needs to be as free of germs as possible. You can reduce the number of germs on your skin by washing with CHG (chlorahexidine gluconate) Soap before surgery.  CHG is an antiseptic cleaner which kills germs and bonds with the skin to continue killing germs even after washing.     Please do not use if you have an allergy to CHG or antibacterial soaps. If your skin becomes reddened/irritated stop using the CHG.  Do not shave  (including legs and underarms) for at least 48 hours prior to first CHG shower. It is OK to shave your face.  Please follow these instructions carefully.     Shower the NIGHT BEFORE SURGERY and the MORNING OF SURGERY with CHG Soap.   If you chose to wash your hair, wash your hair first as usual with your normal shampoo. After you shampoo, rinse your hair and body thoroughly to remove the shampoo.  Then [employer] and genitals (private parts) with your normal soap and rinse thoroughly to remove soap.  After that Use CHG Soap as you would any other liquid soap. You can apply CHG directly to the skin and wash gently with a scrungie or a clean washcloth.   Apply the CHG Soap to your body ONLY FROM THE NECK DOWN.  Do not use on open wounds or open sores. Avoid contact with your eyes, ears, mouth and genitals (private parts). Wash Face and genitals (private parts)  with your normal soap.   Wash thoroughly, paying special attention to the area where your surgery will be performed.  Thoroughly rinse your body with warm water from the neck down.  DO NOT shower/wash with your normal soap after using and rinsing off the CHG Soap.  Pat yourself dry with a CLEAN TOWEL.  Wear CLEAN PAJAMAS to bed the night before surgery  Place CLEAN SHEETS on your bed the night before your surgery  DO NOT SLEEP WITH PETS.   Day of Surgery:  Take a shower with CHG soap. Wear Clean/Comfortable clothing the morning of surgery Do not apply any deodorants/lotions.   Remember to brush your teeth WITH YOUR REGULAR TOOTHPASTE.   Please read over the following fact sheets that you were given.

## 2021-11-23 ENCOUNTER — Encounter (HOSPITAL_COMMUNITY)
Admission: RE | Admit: 2021-11-23 | Discharge: 2021-11-23 | Disposition: A | Discharge status: Home / Self Care | Payer: Medicare HMO | Source: Ambulatory Visit | Attending: Orthopaedic Surgery | Admitting: Orthopaedic Surgery

## 2021-11-23 ENCOUNTER — Ambulatory Visit (HOSPITAL_COMMUNITY)
Admission: RE | Admit: 2021-11-23 | Discharge: 2021-11-23 | Disposition: A | Discharge status: Home / Self Care | Payer: Medicare HMO | Source: Ambulatory Visit | Attending: Surgery | Admitting: Surgery

## 2021-11-23 ENCOUNTER — Other Ambulatory Visit: Payer: Self-pay

## 2021-11-23 ENCOUNTER — Encounter (HOSPITAL_COMMUNITY): Payer: Self-pay

## 2021-11-23 DIAGNOSIS — Z01818 Encounter for other preprocedural examination: Secondary | ICD-10-CM | POA: Diagnosis present

## 2021-11-23 HISTORY — DX: Cardiac murmur, unspecified: R01.1

## 2021-11-23 HISTORY — DX: Atherosclerotic heart disease of native coronary artery without angina pectoris: I25.10

## 2021-11-23 HISTORY — DX: Paroxysmal atrial fibrillation: I48.0

## 2021-11-23 HISTORY — DX: Chronic kidney disease, unspecified: N18.9

## 2021-11-23 HISTORY — DX: Essential (primary) hypertension: I10

## 2021-11-23 LAB — COMPREHENSIVE METABOLIC PANEL
ALT: 27 U/L (ref 0–44)
AST: 34 U/L (ref 15–41)
Albumin: 4 g/dL (ref 3.5–5.0)
Alkaline Phosphatase: 59 U/L (ref 38–126)
Anion gap: 8 (ref 5–15)
BUN: 16 mg/dL (ref 8–23)
CO2: 25 mmol/L (ref 22–32)
Calcium: 9.7 mg/dL (ref 8.9–10.3)
Chloride: 97 mmol/L — ABNORMAL LOW (ref 98–111)
Creatinine, Ser: 1.32 mg/dL — ABNORMAL HIGH (ref 0.61–1.24)
GFR, Estimated: 58 mL/min — ABNORMAL LOW (ref >60)
Glucose, Bld: 108 mg/dL — ABNORMAL HIGH (ref 70–99)
Potassium: 4.4 mmol/L (ref 3.5–5.1)
Sodium: 130 mmol/L — ABNORMAL LOW (ref 135–145)
Total Bilirubin: 0.7 mg/dL (ref 0.3–1.2)
Total Protein: 6.2 g/dL — ABNORMAL LOW (ref 6.5–8.1)

## 2021-11-23 LAB — CBC
HCT: 31.8 % — ABNORMAL LOW (ref 39.0–52.0)
Hemoglobin: 11.1 g/dL — ABNORMAL LOW (ref 13.0–17.0)
MCH: 31.3 pg (ref 26.0–34.0)
MCHC: 34.9 g/dL (ref 30.0–36.0)
MCV: 89.6 fL (ref 80.0–100.0)
Platelets: 140 10*3/uL — ABNORMAL LOW (ref 150–400)
RBC: 3.55 MIL/uL — ABNORMAL LOW (ref 4.22–5.81)
RDW: 12.3 % (ref 11.5–15.5)
WBC: 6.4 10*3/uL (ref 4.0–10.5)
nRBC: 0 % (ref 0.0–0.2)

## 2021-11-23 LAB — TYPE AND SCREEN
ABO/RH(D): A NEG
Antibody Screen: NEGATIVE

## 2021-11-23 LAB — SURGICAL PCR SCREEN
MRSA, PCR: NEGATIVE
Staphylococcus aureus: POSITIVE — AB

## 2021-11-23 NOTE — Progress Notes (Signed)
PCP - Dr. Dineen Kid Cardiologist - Dr. Carlis Stable  PPM/ICD - denies   Chest x-ray - 11/23/21 at PAT EKG - 11/23/21 at PAT Stress Test - pt states he had one many years ago and it was normal ECHO - 05/31/21 Cardiac Cath - 05/16/17  Sleep Study - 09/24/2019, pt not diagnosed with OSA   DM- denies  Blood Thinner Instructions: n/a Aspirin Instructions: pt instructed to f/u with surgeon if no instructions given.  ERAS Protcol - yes PRE-SURGERY Ensure given at PAT  COVID TEST- pt scheduled for testing on 11/27/21   Anesthesia review: yes, cardiac hx  Patient denies shortness of breath, fever, cough and chest pain at PAT appointment   All instructions explained to the patient, with a verbal understanding of the material. Patient agrees to go over the instructions while at home for a better understanding. Patient also instructed to wear a mask in public after being tested for COVID-19. The opportunity to ask questions was provided.

## 2021-11-24 ENCOUNTER — Encounter (HOSPITAL_COMMUNITY): Payer: Self-pay

## 2021-11-24 NOTE — Progress Notes (Signed)
Anesthesia Chart Review:  Case: [id number] Date/Time: 11/29/21 1215   Procedure: L4-5 GILL PROCEDURE, LEFT TRANSFORAMINAL LUMBAR INTERBODY FUSION, PEDICLE INSTRUMENTATION, LATERAL FUSION   Anesthesia type: General   Pre-op diagnosis: L4-5 severe stenosis, anterolisthesis   Location: [facility] OR ROOM 03 / [facility]   Surgeons: Marybelle Killings, MD       DISCUSSION: Patient is a 71 year old male scheduled for the above procedure.  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  Last cardiology visit 08/04/21. Recent echo showed normal LVEF, mild MR, eccentric moderate AI. Some fatigue, but orthostatic dizziness resolved. HR 43-101 bpm on 2022 ZioXT. Dr. Edd Arbour signed a note of clearance with permission to hold hold ASA for surgery but resume as soon as safe/possible after surgery. I notified Sherrie at Dr. Lorin Mercy office that patient would need to be notified about perioperative ASA instructions if not done so already.    Preoperative COVID-19 testing is scheduled for 11/27/2021.  Anesthesia team to evaluate on the day of surgery.   VS: BP 124/66    Pulse 71    Temp 36.9 C (Oral)    Resp 17    Ht 5\' 11"  (1.803 m)    Wt 93.5 kg    SpO2 99%    BMI 28.76 kg/m   PROVIDERS: Dineen Kid, MD is PCP ([facility]). Last visit 09/25/21. He is aware that patient needs back surgery.  Carlis Stable, MD is cardiologist ([facility])   LABS: Labs reviewed: Acceptable for surgery. (all labs ordered are listed, but only abnormal results are displayed)  Labs Reviewed   SURGICAL PCR SCREEN - Abnormal; Notable for the following components:      Result Value   Staphylococcus aureus POSITIVE (*)    All other components within normal limits  CBC - Abnormal; Notable for the following components:   RBC 3.55 (*)    Hemoglobin 11.1 (*)    HCT 31.8 (*)    Platelets 140 (*)    All other components within normal limits  COMPREHENSIVE METABOLIC PANEL - Abnormal; Notable for the following components:   Sodium 130 (*)    Chloride 97 (*)    Glucose, Bld 108 (*)    Creatinine, Ser 1.32 (*)    Total Protein 6.2 (*)    GFR, Estimated 58 (*)    All other components within normal limits  TYPE AND SCREEN  TSH 2.55 09/22/21 ([facility])   IMAGES: CXR 11/23/21: FINDINGS: - Cardiomediastinal silhouette unchanged in size and contour. Surgical changes of median sternotomy, CABG, mitral valve annuloplasty - Aortic arch calcifications. - No evidence of central vascular congestion. No interlobular septal thickening. - No pneumothorax or pleural effusion. Coarsened interstitial markings, with no confluent airspace disease. - No acute displaced fracture. Degenerative changes of the spine. IMPRESSION: - Negative for acute cardiopulmonary disease. - Surgical changes of median sternotomy, CABG, mitral valve annuloplasty   MRI L-spine 11/28/20: IMPRESSION: 1. L4-5: Severe multifactorial spinal stenosis likely to cause neural compression. Considerable worsening since 2012. Contributing factors include facet arthropathy with 11 mm of anterolisthesis and bulging of the disc. 2. L2-3 and L3-4: Moderate disc bulges. Mild facet and  ligamentous hypertrophy. Mild stenosis of the lateral recesses and foramina but without definite neural compression. Slight worsening since 2012.   EKG: 04/14/21 ([facility]): Sinus bradycardia with marked sinus arrhythmia.  First-degree AV block.  Ventricular rate 57 bpm.   CV: Echo 05/31/21 ([facility]): SUMMARY  The left ventricular size is  normal.  There is mild concentric left ventricular hypertrophy.  Left ventricular filling pattern is indeterminate.  LV ejection fraction = 55-60%.  The right ventricle is normal in size and function.  The left atrium is moderately dilated.  The right atrium is mildly dilated.  There is moderate aortic regurgitation.  There is an eccentric jet of aortic insufficiency directed against the  anterior mitral leaflet.  Status post mitral valve repair with annuloplasty ring; inflow  gradient 4 mmHg at a HR of 55 bpm.  There is mild mitral regurgitation.  The mitral regurgitant jet is eccentrically directed.  IVC size was normal.  There is no pericardial effusion.  There is no significant change in comparison with the last study.   ZioXT heart monitor 04/14/21-04/28/21 ([facility]) Findings: Patient has a minimum heart rate of 43 bpm, maximum heart rate of 101 bpm, and average heart rate of 59 bpm.  Predominant underlying rhythm was sinus rhythm.  First-degree AV block was present.  Isolated SVE's were rare (< 1.0%). SVE couplets were rare (< 1.0%). SVE triplets were rare (< 1.0%).  Isolated VE's were rare (< 1.0%).  VE couplets were rare (< 1.0%). No VE triplets were present.  Cardiac cath 05/16/17 ([facility]): Coronary Findings Diagnostic Dominance: Right Left Main: LM lesion, 10% stenosed. The lesion is calcified.  Left Anterior Descending: Prox LAD lesion, 10% stenosed. The lesion is irregular.  Left Circumflex: Ost Cx lesion, 60% stenosed. Prox Cx to Mid Cx lesion, 65% stenosed. The lesion is moderately calcified.  Right Coronary Artery: Prox RCA lesion, 30% stenosed. Mid RCA lesion, 45% stenosed.   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   TONSILLECTOMY     removed as a child    MEDICATIONS:  acetaminophen (TYLENOL) 500 MG tablet   aspirin EC 81 MG tablet   atorvastatin (LIPITOR) 80 MG tablet   chlorthalidone (HYGROTON) 25 MG tablet   Cholecalciferol 125 MCG (5000 UT) TABS   esomeprazole (NEXIUM) 40 MG capsule   fluticasone (FLONASE) 50 MCG/ACT nasal spray   ibuprofen (ADVIL) 200 MG tablet   levothyroxine (SYNTHROID, LEVOTHROID) 50 MCG tablet   losartan (COZAAR) 100 MG tablet   metoprolol tartrate (LOPRESSOR) 25 MG tablet   Multiple Vitamin (MULTI-VITAMIN) tablet   sildenafil (REVATIO) 20 MG tablet   No current facility-administered medications for this encounter.    Shonna ChockAllison Anastacio Bua, PA-C Surgical Short Stay/Anesthesiology  [facility] Phone [phone] [facility] Phone [phone] 11/24/2021 2:58 PM

## 2021-11-24 NOTE — Anesthesia Preprocedure Evaluation (Addendum)
Anesthesia Evaluation  Patient identified by MRN, date of birth, ID band Patient awake    Reviewed: Allergy & Precautions, NPO status , Patient's Chart, lab work & pertinent test results  Airway Mallampati: II  TM Distance: >3 FB Neck ROM: Full    Dental no notable dental hx. (+) Dental Advisory Given, Teeth Intact   Pulmonary neg sleep apnea (per pt was negative study),    Pulmonary exam normal breath sounds clear to auscultation       Cardiovascular hypertension, Pt. on medications and Pt. on home beta blockers + CAD (s/p CABG) and + CABG (2018)  Normal cardiovascular exam+ dysrhythmias (s/p ablation, no A/C) Atrial Fibrillation + Valvular Problems/Murmurs (s/p CABG MVR 2018, mod AI, mild MR) AI and MR  Rhythm:Regular Rate:Normal  Echo 05/31/21 ([facility]): SUMMARY  The left ventricular size is normal.  There is mild concentric left ventricular hypertrophy.  Left ventricular filling pattern is indeterminate.  LV ejection fraction = 55-60%.  The right ventricle is normal in size and function.  The left atrium is moderately dilated.  The right atrium is mildly dilated.  There is moderate aortic regurgitation.  There is an eccentric jet of aortic insufficiency directed against the  anterior mitral leaflet.  Status post mitral valve repair with annuloplasty ring; inflow  gradient 4 mmHg at a HR of 55 bpm.  There is mild mitral regurgitation.  The mitral regurgitant jet is eccentrically directed.  IVC size was normal.  There is no pericardial effusion.  There is no significant change in comparison with the last study.    Neuro/Psych negative neurological ROS  negative psych ROS   GI/Hepatic Neg liver ROS, GERD  Medicated and Controlled,  Endo/Other  Hypothyroidism   Renal/GU Renal disease (Cr 1.32)  negative genitourinary   Musculoskeletal negative musculoskeletal ROS (+)   Abdominal   Peds negative pediatric  ROS (+)  Hematology negative hematology ROS (+) hct 31.8, plt 140    Anesthesia Other Findings   Reproductive/Obstetrics negative OB ROS                            Anesthesia Physical Anesthesia Plan  ASA: 2  Anesthesia Plan: General   Post-op Pain Management: Ofirmev IV (intra-op)   Induction: Intravenous  PONV Risk Score and Plan: 2 and Ondansetron, Dexamethasone and Treatment may vary due to age or medical condition  Airway Management Planned: Oral ETT  Additional Equipment: None  Intra-op Plan:   Post-operative Plan: Extubation in OR  Informed Consent: I have reviewed the patients History and Physical, chart, labs and discussed the procedure including the risks, benefits and alternatives for the proposed anesthesia with the patient or authorized representative who has indicated his/her understanding and acceptance.     Dental advisory given  Plan Discussed with: CRNA  Anesthesia Plan Comments: ( )       Anesthesia Quick Evaluation

## 2021-11-27 ENCOUNTER — Other Ambulatory Visit (HOSPITAL_COMMUNITY)
Admission: RE | Admit: 2021-11-27 | Discharge: 2021-11-27 | Disposition: A | Discharge status: Home / Self Care | Payer: Medicare HMO | Source: Ambulatory Visit | Attending: Orthopaedic Surgery | Admitting: Orthopaedic Surgery

## 2021-11-27 DIAGNOSIS — Z01818 Encounter for other preprocedural examination: Secondary | ICD-10-CM

## 2021-11-27 DIAGNOSIS — Z01812 Encounter for preprocedural laboratory examination: Secondary | ICD-10-CM | POA: Diagnosis present

## 2021-11-27 DIAGNOSIS — Z20822 Contact with and (suspected) exposure to covid-19: Secondary | ICD-10-CM | POA: Diagnosis not present

## 2021-11-28 LAB — SARS CORONAVIRUS 2 (TAT 6-24 HRS): SARS Coronavirus 2: NEGATIVE

## 2021-11-29 ENCOUNTER — Observation Stay (HOSPITAL_COMMUNITY)
Admission: RE | Admit: 2021-11-29 | Discharge: 2021-11-30 | Disposition: A | Discharge status: Home / Self Care | Payer: Medicare HMO | Attending: Orthopaedic Surgery | Admitting: Orthopaedic Surgery

## 2021-11-29 ENCOUNTER — Ambulatory Visit (HOSPITAL_COMMUNITY): Payer: Medicare HMO

## 2021-11-29 ENCOUNTER — Ambulatory Visit (HOSPITAL_COMMUNITY): Payer: Medicare HMO | Admitting: Physician Assistant

## 2021-11-29 ENCOUNTER — Encounter (HOSPITAL_COMMUNITY)
Admission: RE | Disposition: A | Discharge status: Home / Self Care | Payer: Self-pay | Source: Home / Self Care | Attending: Orthopaedic Surgery

## 2021-11-29 ENCOUNTER — Encounter (HOSPITAL_COMMUNITY): Payer: Self-pay | Admitting: Orthopaedic Surgery

## 2021-11-29 ENCOUNTER — Ambulatory Visit (HOSPITAL_COMMUNITY): Payer: Medicare HMO | Admitting: Certified Registered"

## 2021-11-29 ENCOUNTER — Other Ambulatory Visit: Payer: Self-pay

## 2021-11-29 DIAGNOSIS — M5416 Radiculopathy, lumbar region: Secondary | ICD-10-CM | POA: Insufficient documentation

## 2021-11-29 DIAGNOSIS — Z419 Encounter for procedure for purposes other than remedying health state, unspecified: Secondary | ICD-10-CM

## 2021-11-29 DIAGNOSIS — Z7982 Long term (current) use of aspirin: Secondary | ICD-10-CM | POA: Insufficient documentation

## 2021-11-29 DIAGNOSIS — Z01818 Encounter for other preprocedural examination: Secondary | ICD-10-CM

## 2021-11-29 DIAGNOSIS — M48062 Spinal stenosis, lumbar region with neurogenic claudication: Secondary | ICD-10-CM

## 2021-11-29 DIAGNOSIS — M48061 Spinal stenosis, lumbar region without neurogenic claudication: Secondary | ICD-10-CM | POA: Diagnosis present

## 2021-11-29 DIAGNOSIS — E039 Hypothyroidism, unspecified: Secondary | ICD-10-CM | POA: Diagnosis not present

## 2021-11-29 DIAGNOSIS — I2510 Atherosclerotic heart disease of native coronary artery without angina pectoris: Secondary | ICD-10-CM | POA: Diagnosis not present

## 2021-11-29 DIAGNOSIS — M4316 Spondylolisthesis, lumbar region: Secondary | ICD-10-CM | POA: Diagnosis not present

## 2021-11-29 DIAGNOSIS — Z79899 Other long term (current) drug therapy: Secondary | ICD-10-CM | POA: Insufficient documentation

## 2021-11-29 DIAGNOSIS — N189 Chronic kidney disease, unspecified: Secondary | ICD-10-CM | POA: Diagnosis not present

## 2021-11-29 DIAGNOSIS — I129 Hypertensive chronic kidney disease with stage 1 through stage 4 chronic kidney disease, or unspecified chronic kidney disease: Secondary | ICD-10-CM | POA: Diagnosis not present

## 2021-11-29 LAB — CBC
HCT: 27.3 % — ABNORMAL LOW (ref 39.0–52.0)
Hemoglobin: 9.5 g/dL — ABNORMAL LOW (ref 13.0–17.0)
MCH: 30.9 pg (ref 26.0–34.0)
MCHC: 34.8 g/dL (ref 30.0–36.0)
MCV: 88.9 fL (ref 80.0–100.0)
Platelets: 125 10*3/uL — ABNORMAL LOW (ref 150–400)
RBC: 3.07 MIL/uL — ABNORMAL LOW (ref 4.22–5.81)
RDW: 12.8 % (ref 11.5–15.5)
WBC: 5.6 10*3/uL (ref 4.0–10.5)
nRBC: 0 % (ref 0.0–0.2)

## 2021-11-29 LAB — ABO/RH: ABO/RH(D): A NEG

## 2021-11-29 SURGERY — POSTERIOR LUMBAR FUSION 1 LEVEL
Anesthesia: General | Site: Spine Lumbar

## 2021-11-29 MED ORDER — DOCUSATE SODIUM 100 MG PO CAPS
100.0000 mg | ORAL_CAPSULE | Freq: Two times a day (BID) | ORAL | Status: DC
  Administered 2021-11-29 – 2021-11-30 (×2): 100 mg via ORAL
  Filled 2021-11-29 (×2): qty 1

## 2021-11-29 MED ORDER — HYDROMORPHONE HCL 1 MG/ML IJ SOLN
INTRAMUSCULAR | Status: AC
  Filled 2021-11-29: qty 1

## 2021-11-29 MED ORDER — SODIUM CHLORIDE 0.9% FLUSH
3.0000 mL | Freq: Two times a day (BID) | INTRAVENOUS | Status: DC
  Administered 2021-11-30: 3 mL via INTRAVENOUS

## 2021-11-29 MED ORDER — FLUTICASONE PROPIONATE 50 MCG/ACT NA SUSP
2.0000 | Freq: Every day | NASAL | Status: DC
  Filled 2021-11-29: qty 16

## 2021-11-29 MED ORDER — PANTOPRAZOLE SODIUM 40 MG PO TBEC
40.0000 mg | DELAYED_RELEASE_TABLET | Freq: Every day | ORAL | Status: DC
  Administered 2021-11-30: 40 mg via ORAL
  Filled 2021-11-29: qty 1

## 2021-11-29 MED ORDER — ONDANSETRON HCL 4 MG PO TABS
4.0000 mg | ORAL_TABLET | Freq: Four times a day (QID) | ORAL | Status: DC | PRN
  Administered 2021-11-30: 4 mg via ORAL
  Filled 2021-11-29: qty 1

## 2021-11-29 MED ORDER — HYDROMORPHONE HCL 1 MG/ML IJ SOLN
0.2500 mg | INTRAMUSCULAR | Status: DC | PRN
  Administered 2021-11-29 (×3): 0.5 mg via INTRAVENOUS

## 2021-11-29 MED ORDER — EPHEDRINE SULFATE 50 MG/ML IJ SOLN
INTRAMUSCULAR | Status: DC | PRN
  Administered 2021-11-29 (×2): 5 mg via INTRAVENOUS

## 2021-11-29 MED ORDER — SODIUM CHLORIDE 0.9% FLUSH: 3.0000 mL | INTRAVENOUS | Status: DC | PRN

## 2021-11-29 MED ORDER — SODIUM CHLORIDE 0.9 % IV SOLN: INTRAVENOUS | Status: DC

## 2021-11-29 MED ORDER — LIDOCAINE HCL (CARDIAC) PF 100 MG/5ML IV SOSY
PREFILLED_SYRINGE | INTRAVENOUS | Status: DC | PRN
Start: 2021-11-29 — End: 2021-11-29
  Administered 2021-11-29: 60 mg via INTRAVENOUS

## 2021-11-29 MED ORDER — PHENYLEPHRINE 40 MCG/ML (10ML) SYRINGE FOR IV PUSH (FOR BLOOD PRESSURE SUPPORT)
PREFILLED_SYRINGE | INTRAVENOUS | Status: AC
  Filled 2021-11-29: qty 10

## 2021-11-29 MED ORDER — ACETAMINOPHEN 10 MG/ML IV SOLN
INTRAVENOUS | Status: AC
  Filled 2021-11-29: qty 100

## 2021-11-29 MED ORDER — ROCURONIUM 10MG/ML (10ML) SYRINGE FOR MEDFUSION PUMP - OPTIME
INTRAVENOUS | Status: DC | PRN
Start: 2021-11-29 — End: 2021-11-29
  Administered 2021-11-29: 100 mg via INTRAVENOUS

## 2021-11-29 MED ORDER — BUPIVACAINE HCL (PF) 0.5 % IJ SOLN
INTRAMUSCULAR | Status: AC
  Filled 2021-11-29: qty 30

## 2021-11-29 MED ORDER — HYDROMORPHONE HCL 1 MG/ML IJ SOLN: 0.5000 mg | INTRAMUSCULAR | Status: DC | PRN

## 2021-11-29 MED ORDER — MENTHOL 3 MG MT LOZG
1.0000 | LOZENGE | OROMUCOSAL | Status: DC | PRN
Start: 2021-11-29 — End: 2021-11-30

## 2021-11-29 MED ORDER — CEFAZOLIN SODIUM-DEXTROSE 2-4 GM/100ML-% IV SOLN
2.0000 g | INTRAVENOUS | Status: AC
  Administered 2021-11-29: 2 g via INTRAVENOUS
  Filled 2021-11-29: qty 100

## 2021-11-29 MED ORDER — PHENYLEPHRINE HCL (PRESSORS) 10 MG/ML IV SOLN
INTRAVENOUS | Status: DC | PRN
  Administered 2021-11-29 (×2): 80 ug via INTRAVENOUS

## 2021-11-29 MED ORDER — HEMOSTATIC AGENTS (NO CHARGE) OPTIME
TOPICAL | Status: DC | PRN
  Administered 2021-11-29 (×2): 1 via TOPICAL

## 2021-11-29 MED ORDER — METHOCARBAMOL 1000 MG/10ML IJ SOLN
500.0000 mg | Freq: Four times a day (QID) | INTRAVENOUS | Status: DC | PRN
  Filled 2021-11-29: qty 5

## 2021-11-29 MED ORDER — ORAL CARE MOUTH RINSE: 15.0000 mL | Freq: Once | OROMUCOSAL | Status: AC

## 2021-11-29 MED ORDER — ONDANSETRON HCL 4 MG/2ML IJ SOLN
INTRAMUSCULAR | Status: DC | PRN
Start: 2021-11-29 — End: 2021-11-29
  Administered 2021-11-29: 4 mg via INTRAVENOUS

## 2021-11-29 MED ORDER — LEVOTHYROXINE SODIUM 25 MCG PO TABS
50.0000 ug | ORAL_TABLET | Freq: Every day | ORAL | Status: DC
  Administered 2021-11-30: 50 ug via ORAL
  Filled 2021-11-29: qty 2

## 2021-11-29 MED ORDER — ACETAMINOPHEN 650 MG RE SUPP: 650.0000 mg | RECTAL | Status: DC | PRN

## 2021-11-29 MED ORDER — LACTATED RINGERS IV SOLN: INTRAVENOUS | Status: DC

## 2021-11-29 MED ORDER — MIDAZOLAM HCL 2 MG/2ML IJ SOLN
INTRAMUSCULAR | Status: DC | PRN
  Administered 2021-11-29: 2 mg via INTRAVENOUS

## 2021-11-29 MED ORDER — PHENOL 1.4 % MT LIQD: 1.0000 | OROMUCOSAL | Status: DC | PRN

## 2021-11-29 MED ORDER — DEXAMETHASONE SODIUM PHOSPHATE 10 MG/ML IJ SOLN
INTRAMUSCULAR | Status: AC
  Filled 2021-11-29: qty 1

## 2021-11-29 MED ORDER — GLYCOPYRROLATE 0.2 MG/ML IJ SOLN
INTRAMUSCULAR | Status: DC | PRN
  Administered 2021-11-29: .2 mg via INTRAVENOUS

## 2021-11-29 MED ORDER — LACTATED RINGERS IV SOLN: INTRAVENOUS | Status: DC | PRN

## 2021-11-29 MED ORDER — PHENYLEPHRINE HCL-NACL 20-0.9 MG/250ML-% IV SOLN
INTRAVENOUS | Status: DC | PRN
  Administered 2021-11-29: 20 ug/min via INTRAVENOUS

## 2021-11-29 MED ORDER — POLYETHYLENE GLYCOL 3350 17 G PO PACK: 17.0000 g | PACK | Freq: Every day | ORAL | Status: DC | PRN

## 2021-11-29 MED ORDER — OXYCODONE HCL 5 MG PO TABS
5.0000 mg | ORAL_TABLET | ORAL | Status: DC | PRN
  Administered 2021-11-29 – 2021-11-30 (×3): 5 mg via ORAL
  Filled 2021-11-29 (×3): qty 1

## 2021-11-29 MED ORDER — MIDAZOLAM HCL 2 MG/2ML IJ SOLN
INTRAMUSCULAR | Status: AC
  Filled 2021-11-29: qty 2

## 2021-11-29 MED ORDER — ATORVASTATIN CALCIUM 80 MG PO TABS
80.0000 mg | ORAL_TABLET | Freq: Every day | ORAL | Status: DC
  Administered 2021-11-29 – 2021-11-30 (×2): 80 mg via ORAL
  Filled 2021-11-29 (×2): qty 1

## 2021-11-29 MED ORDER — THROMBIN 20000 UNITS EX SOLR
CUTANEOUS | Status: AC
  Filled 2021-11-29: qty 20000

## 2021-11-29 MED ORDER — ONDANSETRON HCL 4 MG/2ML IJ SOLN: 4.0000 mg | Freq: Once | INTRAMUSCULAR | Status: DC | PRN

## 2021-11-29 MED ORDER — VITAMIN D 25 MCG (1000 UNIT) PO TABS
5000.0000 [IU] | ORAL_TABLET | Freq: Two times a day (BID) | ORAL | Status: DC
  Administered 2021-11-29 – 2021-11-30 (×2): 5000 [IU] via ORAL
  Filled 2021-11-29 (×2): qty 5

## 2021-11-29 MED ORDER — FENTANYL CITRATE (PF) 250 MCG/5ML IJ SOLN
INTRAMUSCULAR | Status: AC
  Filled 2021-11-29: qty 5

## 2021-11-29 MED ORDER — PROPOFOL 10 MG/ML IV BOLUS
INTRAVENOUS | Status: DC | PRN
  Administered 2021-11-29: 150 mg via INTRAVENOUS

## 2021-11-29 MED ORDER — SODIUM CHLORIDE 0.9 % IV SOLN: 250.0000 mL | INTRAVENOUS | Status: DC

## 2021-11-29 MED ORDER — CHLORTHALIDONE 25 MG PO TABS
12.5000 mg | ORAL_TABLET | Freq: Every day | ORAL | Status: DC
  Administered 2021-11-29: 12.5 mg via ORAL
  Filled 2021-11-29 (×2): qty 0.5

## 2021-11-29 MED ORDER — FENTANYL CITRATE (PF) 250 MCG/5ML IJ SOLN
INTRAMUSCULAR | Status: DC | PRN
  Administered 2021-11-29 (×3): 50 ug via INTRAVENOUS

## 2021-11-29 MED ORDER — OXYCODONE HCL 5 MG PO TABS: 5.0000 mg | ORAL_TABLET | Freq: Once | ORAL | Status: DC | PRN

## 2021-11-29 MED ORDER — METHOCARBAMOL 500 MG PO TABS
500.0000 mg | ORAL_TABLET | Freq: Four times a day (QID) | ORAL | Status: DC | PRN
  Administered 2021-11-29 – 2021-11-30 (×2): 500 mg via ORAL
  Filled 2021-11-29 (×2): qty 1

## 2021-11-29 MED ORDER — ALBUMIN HUMAN 5 % IV SOLN: INTRAVENOUS | Status: DC | PRN

## 2021-11-29 MED ORDER — PROPOFOL 10 MG/ML IV BOLUS
INTRAVENOUS | Status: AC
  Filled 2021-11-29: qty 20

## 2021-11-29 MED ORDER — OXYCODONE HCL 5 MG/5ML PO SOLN: 5.0000 mg | Freq: Once | ORAL | Status: DC | PRN

## 2021-11-29 MED ORDER — DEXAMETHASONE SODIUM PHOSPHATE 10 MG/ML IJ SOLN
INTRAMUSCULAR | Status: DC | PRN
Start: 2021-11-29 — End: 2021-11-29
  Administered 2021-11-29: 10 mg via INTRAVENOUS

## 2021-11-29 MED ORDER — SUGAMMADEX SODIUM 200 MG/2ML IV SOLN
INTRAVENOUS | Status: DC | PRN
  Administered 2021-11-29: 200 mg via INTRAVENOUS

## 2021-11-29 MED ORDER — GLYCOPYRROLATE PF 0.2 MG/ML IJ SOSY
PREFILLED_SYRINGE | INTRAMUSCULAR | Status: AC
  Filled 2021-11-29: qty 1

## 2021-11-29 MED ORDER — ROCURONIUM BROMIDE 10 MG/ML (PF) SYRINGE
PREFILLED_SYRINGE | INTRAVENOUS | Status: AC
  Filled 2021-11-29: qty 10

## 2021-11-29 MED ORDER — CHLORHEXIDINE GLUCONATE 0.12 % MT SOLN
15.0000 mL | Freq: Once | OROMUCOSAL | Status: AC
  Administered 2021-11-29: 15 mL via OROMUCOSAL
  Filled 2021-11-29: qty 15

## 2021-11-29 MED ORDER — METOPROLOL TARTRATE 25 MG PO TABS
25.0000 mg | ORAL_TABLET | Freq: Two times a day (BID) | ORAL | Status: DC
  Administered 2021-11-29 – 2021-11-30 (×2): 25 mg via ORAL
  Filled 2021-11-29 (×2): qty 1

## 2021-11-29 MED ORDER — ONDANSETRON HCL 4 MG/2ML IJ SOLN: 4.0000 mg | Freq: Four times a day (QID) | INTRAMUSCULAR | Status: DC | PRN

## 2021-11-29 MED ORDER — BUPIVACAINE HCL (PF) 0.5 % IJ SOLN
INTRAMUSCULAR | Status: DC | PRN
  Administered 2021-11-29: 14 mL

## 2021-11-29 MED ORDER — 0.9 % SODIUM CHLORIDE (POUR BTL) OPTIME
TOPICAL | Status: DC | PRN
  Administered 2021-11-29: 1000 mL

## 2021-11-29 MED ORDER — ACETAMINOPHEN 325 MG PO TABS
650.0000 mg | ORAL_TABLET | ORAL | Status: DC | PRN
  Administered 2021-11-29 – 2021-11-30 (×3): 650 mg via ORAL
  Filled 2021-11-29 (×3): qty 2

## 2021-11-29 MED ORDER — ONDANSETRON HCL 4 MG/2ML IJ SOLN
INTRAMUSCULAR | Status: AC
  Filled 2021-11-29: qty 2

## 2021-11-29 SURGICAL SUPPLY — 67 items
ADH SKN CLS APL DERMABOND .7 (GAUZE/BANDAGES/DRESSINGS)
AGENT HMST KT MTR STRL THRMB (HEMOSTASIS)
BAG COUNTER SPONGE SURGICOUNT (BAG) ×2 IMPLANT
BAG SPNG CNTER NS LX DISP (BAG) ×1
BLADE CLIPPER SURG (BLADE) IMPLANT
BUR ROUND FLUTED 4 SOFT TCH (BURR) ×2 IMPLANT
CABLE BIPOLOR RESECTION CORD (MISCELLANEOUS) ×2 IMPLANT
CAP LOCKING THREADED (Cap) ×4 IMPLANT
COVER BACK TABLE 80X110 HD (DRAPES) ×2 IMPLANT
COVER SURGICAL LIGHT HANDLE (MISCELLANEOUS) ×2 IMPLANT
DERMABOND ADVANCED (GAUZE/BANDAGES/DRESSINGS)
DERMABOND ADVANCED .7 DNX12 (GAUZE/BANDAGES/DRESSINGS) ×1 IMPLANT
DRAPE C-ARM 42X72 X-RAY (DRAPES) ×2 IMPLANT
DRAPE LAPAROTOMY T 102X78X121 (DRAPES) ×2 IMPLANT
DRAPE MICROSCOPE LEICA (MISCELLANEOUS) ×2 IMPLANT
DRAPE SURG 17X23 STRL (DRAPES) ×3 IMPLANT
DRSG EMULSION OIL 3X3 NADH (GAUZE/BANDAGES/DRESSINGS) ×1 IMPLANT
DRSG MEPILEX BORDER 4X12 (GAUZE/BANDAGES/DRESSINGS) ×1 IMPLANT
DRSG MEPILEX BORDER 4X4 (GAUZE/BANDAGES/DRESSINGS) ×1 IMPLANT
DRSG MEPILEX BORDER 4X8 (GAUZE/BANDAGES/DRESSINGS) ×1 IMPLANT
DRSG PAD ABDOMINAL 8X10 ST (GAUZE/BANDAGES/DRESSINGS) ×2 IMPLANT
DURAPREP 26ML APPLICATOR (WOUND CARE) ×2 IMPLANT
ELECT BLADE 4.0 EZ CLEAN MEGAD (MISCELLANEOUS) ×2
ELECT CAUTERY BLADE 6.4 (BLADE) ×2 IMPLANT
ELECT REM PT RETURN 9FT ADLT (ELECTROSURGICAL) ×2
ELECTRODE BLDE 4.0 EZ CLN MEGD (MISCELLANEOUS) ×1 IMPLANT
ELECTRODE REM PT RTRN 9FT ADLT (ELECTROSURGICAL) ×1 IMPLANT
EVACUATOR 1/8 PVC DRAIN (DRAIN) ×1 IMPLANT
GLOVE SRG 8 PF TXTR STRL LF DI (GLOVE) ×2 IMPLANT
GLOVE SURG ORTHO LTX SZ7.5 (GLOVE) ×4 IMPLANT
GLOVE SURG UNDER POLY LF SZ8 (GLOVE) ×4
GOWN STRL REUS W/ TWL LRG LVL3 (GOWN DISPOSABLE) ×1 IMPLANT
GOWN STRL REUS W/ TWL XL LVL3 (GOWN DISPOSABLE) ×1 IMPLANT
GOWN STRL REUS W/TWL 2XL LVL3 (GOWN DISPOSABLE) ×2 IMPLANT
GOWN STRL REUS W/TWL LRG LVL3 (GOWN DISPOSABLE) ×2
GOWN STRL REUS W/TWL XL LVL3 (GOWN DISPOSABLE) ×2
HEMOSTAT SURGICEL 2X14 (HEMOSTASIS) IMPLANT
KIT BASIN OR (CUSTOM PROCEDURE TRAY) ×2 IMPLANT
KIT POSITION SURG JACKSON T1 (MISCELLANEOUS) ×1 IMPLANT
KIT TURNOVER KIT B (KITS) ×2 IMPLANT
MANIFOLD NEPTUNE II (INSTRUMENTS) ×1 IMPLANT
NS IRRIG 1000ML POUR BTL (IV SOLUTION) ×2 IMPLANT
PACK LAMINECTOMY ORTHO (CUSTOM PROCEDURE TRAY) ×2 IMPLANT
PAD ARMBOARD 7.5X6 YLW CONV (MISCELLANEOUS) ×4 IMPLANT
PATTIES SURGICAL .5 X.5 (GAUZE/BANDAGES/DRESSINGS) IMPLANT
PATTIES SURGICAL .75X.75 (GAUZE/BANDAGES/DRESSINGS) ×2 IMPLANT
ROD 40MM SPINAL (Rod) ×1 IMPLANT
ROD CREO 45MM SPINAL (Rod) ×1 IMPLANT
SCREW 6.5X45 (Screw) ×5 IMPLANT
SPONGE SURGIFOAM ABS GEL 100 (HEMOSTASIS) IMPLANT
SPONGE T-LAP 18X18 ~~LOC~~+RFID (SPONGE) ×1 IMPLANT
SPONGE T-LAP 4X18 ~~LOC~~+RFID (SPONGE) ×7 IMPLANT
STAPLER VISISTAT 35W (STAPLE) ×1 IMPLANT
SURGIFLO W/THROMBIN 8M KIT (HEMOSTASIS) ×1 IMPLANT
SUT BONE WAX W31G (SUTURE) ×2 IMPLANT
SUT VIC AB 0 CT1 27 (SUTURE) ×2
SUT VIC AB 0 CT1 27XBRD ANBCTR (SUTURE) IMPLANT
SUT VIC AB 1 CTX 36 (SUTURE) ×2
SUT VIC AB 1 CTX36XBRD ANBCTR (SUTURE) ×1 IMPLANT
SUT VIC AB 2-0 CT1 27 (SUTURE) ×2
SUT VIC AB 2-0 CT1 TAPERPNT 27 (SUTURE) ×1 IMPLANT
SUT VIC AB 3-0 X1 27 (SUTURE) IMPLANT
TOWEL GREEN STERILE (TOWEL DISPOSABLE) ×2 IMPLANT
TOWEL GREEN STERILE FF (TOWEL DISPOSABLE) ×2 IMPLANT
TRAY FOLEY MTR SLVR 16FR STAT (SET/KITS/TRAYS/PACK) ×2 IMPLANT
WATER STERILE IRR 1000ML POUR (IV SOLUTION) ×1 IMPLANT
YANKAUER SUCT BULB TIP NO VENT (SUCTIONS) ×2 IMPLANT

## 2021-11-29 NOTE — H&P (Signed)
Dustin [name] is an 71 y.o. male.   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  States that symptoms unchanged from previous visit.  He is want to proceed with L4-5 Gill procedure and fusion with Dr. Ophelia Charter November 29, 2021.  Today history and physical performed.  Review  of systems negative.  Past medical history significant for mitral valve repair with one-vessel CABG August 2018 by Dr. [place] [name].  Dr. Ophelia Charter did not request cardiac clearance.  Patient is followed by cardiologist Dr. Pete [name] with [facility] and was last seen by him August 04, 2021.     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   Hypertension    Hypothyroidism 07/01/2018   Paroxysmal A-fib (HCC)    S/P mitral valve repair 07/08/2017   Spinal stenosis of lumbar region 08/22/2021    Past Surgical History:  Procedure Laterality Date   CARDIAC CATHETERIZATION  05/14/2017   hx ablation for A-flutter  11/01/2017   MITRAL VALVE REPAIR (MV)/CORONARY ARTERY BYPASS GRAFTING (CABG)  07/08/2017   SVG-OM, LAA closure, MV reconstruction with annuloplasty and sliding plasty via superior septal approach/Medtronic Simplici-T Annuloplasty System, left GSV harvest 07/08/17 by Dustin Limerick, MD at [facility]   TONSILLECTOMY     removed as a child    History reviewed. No pertinent family history. Social History:  reports that he has never smoked. He has never used smokeless tobacco. He reports current alcohol use of about 1.0 standard drink per week. He reports that he does not use drugs.  Allergies:  Allergies  Allergen Reactions   Budesonide-Formoterol Fumarate     Hoarseness, swollen vocal cords   Sulfamethoxazole Itching    Medications Prior to  Admission  Medication Sig Dispense Refill   acetaminophen (TYLENOL) 500 MG tablet Take 1,000 mg by mouth every 6 (six) hours as needed for mild pain.     aspirin EC 81 MG tablet Take 81 mg by mouth daily.     atorvastatin (LIPITOR) 80 MG tablet Take 80 mg by mouth daily.     chlorthalidone (HYGROTON) 25 MG tablet Take 12.5 mg by mouth daily.     Cholecalciferol 125 MCG (5000 UT) TABS Take 5,000 Units by mouth 2 (two) times daily.     esomeprazole (NEXIUM) 40 MG capsule 40 mg daily at 12 noon.     fluticasone (FLONASE) 50 MCG/ACT nasal spray Place 2 sprays into both nostrils daily.     ibuprofen (ADVIL) 200 MG tablet Take 600 mg by mouth daily as needed (Back pain).     levothyroxine (SYNTHROID, LEVOTHROID) 50 MCG tablet Take 50 mcg by mouth daily before breakfast.     losartan (COZAAR) 100 MG tablet Take 50 mg by mouth daily.     metoprolol tartrate (LOPRESSOR) 25 MG tablet Take 25 mg by mouth 2 (two) times daily.     Multiple Vitamin (MULTI-VITAMIN) tablet Take 1 tablet by mouth daily. Balance of Nature     sildenafil (REVATIO) 20 MG tablet Take 20 mg by mouth daily as needed (ed).      Results for orders placed or performed during the hospital encounter of 11/29/21 (from the past 48 hour(s))  ABO/Rh  Status: None (Preliminary result)   Collection Time: 11/29/21 11:00 AM  Result Value Ref Range   ABO/RH(D) PENDING    No results found.  Review of Systems  Constitutional:  Positive for activity change.  HENT: Negative.    Respiratory: Negative.    Cardiovascular: Negative.   Musculoskeletal:  Positive for back pain and gait problem.  Neurological:  Positive for numbness.  Psychiatric/Behavioral: Negative.     Blood pressure (!) 151/63, pulse 61, temperature 97.9 F (36.6 C), temperature source Oral, resp. rate 17, SpO2 100 %. Physical Exam HENT:     Head: Normocephalic and atraumatic.     Nose: Nose normal.  Eyes:     Extraocular Movements: Extraocular movements intact.   Cardiovascular:     Rate and Rhythm: Normal rate and regular rhythm.  Pulmonary:     Effort: Pulmonary effort is normal. No respiratory distress.     Breath sounds: Normal breath sounds.  Abdominal:     General: There is no distension.  Musculoskeletal:        General: Tenderness present.  Neurological:     Mental Status: He is alert and oriented to person, place, and time.     Assessment/Plan L4-5 stenosis, low back pain and left lower extremity radiculopathy   Surgical procedure discussed along with potential hospital stay.  I asked our surgery scheduler to send a cardiac clearance form to Dr. Edd [name].  Hopefully we can get this back within the next couple of days without any further work-up.  All questions answered     [name], PA-C 11/29/2021, 11:50 AM

## 2021-11-29 NOTE — Interval H&P Note (Signed)
History and Physical Interval Note:  11/29/2021 12:30 PM  [name]  has presented today for surgery, with the diagnosis of L4-5 severe stenosis, anterolisthesis.  The various methods of treatment have been discussed with the patient and family. After consideration of risks, benefits and other options for treatment, the patient has consented to  Procedure(s): L4-5 GILL PROCEDURE, LEFT TRANSFORAMINAL LUMBAR INTERBODY FUSION, PEDICLE INSTRUMENTATION, LATERAL FUSION (N/A) as a surgical intervention.  The patient's history has been reviewed, patient examined, no change in status, stable for surgery.  I have reviewed the patient's chart and labs.  Questions were answered to the patient's satisfaction.     Dustin [name]

## 2021-11-29 NOTE — Transfer of Care (Signed)
Immediate Anesthesia Transfer of Care Note  Patient: Dustin [name]  Procedure(s) Performed: LUMBAR FOUR-FIVE GILL PROCEDURE, LEFT TRANSFORAMINAL LUMBAR INTERBODY FUSION, PEDICLE INSTRUMENTATION, LATERAL FUSION (Spine Lumbar)  Patient Location: PACU  Anesthesia Type:General  Level of Consciousness: awake, drowsy, patient cooperative and responds to stimulation  Airway & Oxygen Therapy: Patient Spontanous Breathing and Patient connected to nasal cannula oxygen  Post-op Assessment: Report given to RN, Post -op Vital signs reviewed and stable and Patient moving all extremities X 4  Post vital signs: Reviewed and stable  Last Vitals:  Vitals Value Taken Time  BP 116/57 11/29/21 1625  Temp 36.3 C 11/29/21 1625  Pulse 60 11/29/21 1628  Resp 11 11/29/21 1628  SpO2 100 % 11/29/21 1628  Vitals shown include unvalidated device data.  Last Pain:  Vitals:   11/29/21 1042  TempSrc:   PainSc: 0-No pain         Complications: No notable events documented.

## 2021-11-29 NOTE — Progress Notes (Signed)
Orthopedic Tech Progress Note Patient Details:  Dustin [name] 22-Dec-1950 [id number]  Ortho Devices Type of Ortho Device: Lumbar corsett Ortho Device/Splint Interventions: Ordered     Dropped off in pt's room on 3C.  [name] 11/29/2021, 5:28 PM

## 2021-11-29 NOTE — Anesthesia Procedure Notes (Signed)
Procedure Name: Intubation Date/Time: 11/29/2021 1:03 PM Performed by: Claris Che, CRNA Pre-anesthesia Checklist: Patient identified, Emergency Drugs available, Suction available, Patient being monitored and Timeout performed Patient Re-evaluated:Patient Re-evaluated prior to induction Oxygen Delivery Method: Circle system utilized Preoxygenation: Pre-oxygenation with 100% oxygen Induction Type: IV induction and Cricoid Pressure applied Ventilation: Mask ventilation without difficulty Laryngoscope Size: Mac and 4 Grade View: Grade II Tube type: Oral Tube size: 7.5 mm Number of attempts: 1 Airway Equipment and Method: Stylet Placement Confirmation: ETT inserted through vocal cords under direct vision, positive ETCO2 and breath sounds checked- equal and bilateral Secured at: 23 cm Tube secured with: Tape Dental Injury: Teeth and Oropharynx as per pre-operative assessment

## 2021-11-29 NOTE — Anesthesia Postprocedure Evaluation (Signed)
Anesthesia Post Note  Patient: Dustin [name]  Procedure(s) Performed: LUMBAR FOUR-FIVE GILL PROCEDURE, LEFT TRANSFORAMINAL LUMBAR INTERBODY FUSION, PEDICLE INSTRUMENTATION, LATERAL FUSION (Spine Lumbar)     Patient location during evaluation: PACU Anesthesia Type: General Level of consciousness: awake and alert and oriented Pain management: pain level controlled Vital Signs Assessment: post-procedure vital signs reviewed and stable Respiratory status: spontaneous breathing, nonlabored ventilation and respiratory function stable Cardiovascular status: blood pressure returned to baseline Postop Assessment: no apparent nausea or vomiting Anesthetic complications: no   No notable events documented.  Last Vitals:  Vitals:   11/29/21 1655 11/29/21 1710  BP: (!) 104/53 (!) 99/47  Pulse: 63 (!) 59  Resp: 15 15  Temp:    SpO2: 97% 100%    Last Pain:  Vitals:   11/29/21 1655  TempSrc:   PainSc: 2                  Shanda Howells

## 2021-11-30 DIAGNOSIS — M48061 Spinal stenosis, lumbar region without neurogenic claudication: Secondary | ICD-10-CM | POA: Diagnosis not present

## 2021-11-30 LAB — BASIC METABOLIC PANEL
Anion gap: 13 (ref 5–15)
BUN: 18 mg/dL (ref 8–23)
CO2: 23 mmol/L (ref 22–32)
Calcium: 10 mg/dL (ref 8.9–10.3)
Chloride: 98 mmol/L (ref 98–111)
Creatinine, Ser: 1.39 mg/dL — ABNORMAL HIGH (ref 0.61–1.24)
GFR, Estimated: 55 mL/min — ABNORMAL LOW (ref >60)
Glucose, Bld: 152 mg/dL — ABNORMAL HIGH (ref 70–99)
Potassium: 4.6 mmol/L (ref 3.5–5.1)
Sodium: 134 mmol/L — ABNORMAL LOW (ref 135–145)

## 2021-11-30 LAB — CBC
HCT: 24.4 % — ABNORMAL LOW (ref 39.0–52.0)
Hemoglobin: 8.6 g/dL — ABNORMAL LOW (ref 13.0–17.0)
MCH: 31.4 pg (ref 26.0–34.0)
MCHC: 35.2 g/dL (ref 30.0–36.0)
MCV: 89.1 fL (ref 80.0–100.0)
Platelets: 123 10*3/uL — ABNORMAL LOW (ref 150–400)
RBC: 2.74 MIL/uL — ABNORMAL LOW (ref 4.22–5.81)
RDW: 13 % (ref 11.5–15.5)
WBC: 11.2 10*3/uL — ABNORMAL HIGH (ref 4.0–10.5)
nRBC: 0 % (ref 0.0–0.2)

## 2021-11-30 MED ORDER — METHOCARBAMOL 500 MG PO TABS: 500.0000 mg | ORAL_TABLET | Freq: Three times a day (TID) | ORAL | 1 refills | Status: DC | PRN

## 2021-11-30 MED ORDER — ONDANSETRON HCL 4 MG PO TABS: 4.0000 mg | ORAL_TABLET | Freq: Every day | ORAL | 1 refills | Status: DC | PRN

## 2021-11-30 MED ORDER — OXYCODONE-ACETAMINOPHEN 5-325 MG PO TABS: 1.0000 | ORAL_TABLET | ORAL | 0 refills | Status: AC | PRN

## 2021-11-30 NOTE — Evaluation (Signed)
Physical Therapy Evaluation and Discharge Patient Details Name: TEAK BAKA MRN: [id number] DOB: [birth year] Today's Date: 11/30/2021  History of Present Illness  Pt is a 71 y/o M admitted on 11/29/21 for a L4-5 TLIF on 11/29/2021. PMH significant for CKD, CAD, GERD, HTN, hypothyroidis, S/P mitral valve repair.   Clinical Impression  Patient evaluated by Physical Therapy with no further acute PT needs identified. All education has been completed and the patient has no further questions. Pt was able to demonstrate transfers and ambulation with gross min guard assist to supervision for safety. Pt was educated on precautions, brace application/wearing schedule, appropriate activity progression, and car transfer. Wife and daughter present and engaged throughout session. Extended time spent at end of session to answer questions and pt/family verbalized understanding. See below for any follow-up Physical Therapy or equipment needs. PT is signing off. Thank you for this referral.        Recommendations for follow up therapy are one component of a multi-disciplinary discharge planning process, led by the attending physician.  Recommendations may be updated based on patient status, additional functional criteria and insurance authorization.  Follow Up Recommendations No PT follow up    Assistance Recommended at Discharge Set up Supervision/Assistance  Patient can return home with the following  A little help with walking and/or transfers;A little help with bathing/dressing/bathroom;Assist for transportation;Help with stairs or ramp for entrance    Equipment Recommendations Rolling walker (2 wheels)  Recommendations for Other Services       Functional Status Assessment Patient has had a recent decline in their functional status and demonstrates the ability to make significant improvements in function in a reasonable and predictable amount of time.     Precautions / Restrictions  Precautions Precautions: Back Precaution Booklet Issued: Yes (comment) Precaution Comments: Reviewed all precautions and compensatory strategies for ADL's. Required Braces or Orthoses: Spinal Brace Spinal Brace: Lumbar corset;Applied in sitting position Restrictions Weight Bearing Restrictions: No      Mobility  Bed Mobility Overal bed mobility: Needs Assistance Bed Mobility: Rolling;Sidelying to Sit Rolling: Supervision Sidelying to sit: Supervision       General bed mobility comments: HOB flat and rails lowered to simulate home environment. No assist required however supervision provided for safety.    Transfers Overall transfer level: Needs assistance Equipment used: Rolling walker (2 wheels) Transfers: Sit to/from Stand Sit to Stand: Min guard           General transfer comment: Close guard for safety as pt powered up to full stand. No unsteadiness or LOB noted.    Ambulation/Gait Ambulation/Gait assistance: Min guard Gait Distance (Feet): 560 Feet Assistive device: Rolling walker (2 wheels) Gait Pattern/deviations: Step-through pattern;Decreased stride length;Drifts right/left;Trunk flexed Gait velocity: Decreased Gait velocity interpretation: 1.31 - 2.62 ft/sec, indicative of limited community ambulator   General Gait Details: VC's for improved posture, closer walker proximity, and forward gaze. No assist required however close guard provided for safety.  Stairs Stairs: Yes Stairs assistance: Min guard Stair Management: Two rails;Step to pattern;Forwards Number of Stairs: 10 General stair comments: VC's for sequencing and general safety.  Wheelchair Mobility    Modified Rankin (Stroke Patients Only)       Balance Overall balance assessment: Needs assistance Sitting-balance support: Feet supported Sitting balance-Leahy Scale: Good     Standing balance support: No upper extremity supported;During functional activity Standing balance-Leahy Scale:  Fair Standing balance comment: 1 mild LoB in static standing, most likely due to pt feeling woozy  Pertinent Vitals/Pain Pain Assessment: No/denies pain    Home Living Family/patient expects to be discharged to:: Private residence Living Arrangements: Spouse/significant other Available Help at Discharge: Family Type of Home: House Home Access: Stairs to enter Entrance Stairs-Rails: Can reach both Entrance Stairs-Number of Steps: 4 steps   Home Layout: Multi-level;Able to live on main level with bedroom/bathroom Home Equipment: Shower seat - built in      Prior Function Prior Level of Function : Independent/Modified Independent;Driving                     Hand Dominance   Dominant Hand: Right    Extremity/Trunk Assessment   Upper Extremity Assessment Upper Extremity Assessment: Defer to OT evaluation    Lower Extremity Assessment Lower Extremity Assessment: Generalized weakness    Cervical / Trunk Assessment Cervical / Trunk Assessment: Back Surgery  Communication   Communication: No difficulties  Cognition Arousal/Alertness: Awake/alert Behavior During Therapy: WFL for tasks assessed/performed Overall Cognitive Status: Within Functional Limits for tasks assessed                                          General Comments General comments (skin integrity, edema, etc.): VSS on RA, pt reports mild feeling of woozyness intermittently    Exercises     Assessment/Plan    PT Assessment Patient does not need any further PT services  PT Problem List         PT Treatment Interventions      PT Goals (Current goals can be found in the Care Plan section)  Acute Rehab PT Goals PT Goal Formulation: With patient/family Time For Goal Achievement: 12/07/21 Potential to Achieve Goals: Good    Frequency       Co-evaluation               AM-PAC PT "6 Clicks" Mobility  Outcome Measure Help needed  turning from your back to your side while in a flat bed without using bedrails?: None Help needed moving from lying on your back to sitting on the side of a flat bed without using bedrails?: A Little Help needed moving to and from a bed to a chair (including a wheelchair)?: A Little Help needed standing up from a chair using your arms (e.g., wheelchair or bedside chair)?: A Little Help needed to walk in hospital room?: A Little Help needed climbing 3-5 steps with a railing? : A Little 6 Click Score: 19    End of Session Equipment Utilized During Treatment: Gait belt Activity Tolerance: Patient tolerated treatment well Patient left: in bed;with call bell/phone within reach;with family/visitor present Nurse Communication: Mobility status PT Visit Diagnosis: Unsteadiness on feet (R26.81);Pain Pain - part of body:  (back)    Time: [id number] PT Time Calculation (min) (ACUTE ONLY): 39 min   Charges:   PT Evaluation $PT Eval Low Complexity: 1 Low PT Treatments $Gait Training: 8-22 mins $Self Care/Home Management: 8-22        Rolinda Roan, PT, DPT Acute Rehabilitation Services Pager: [phone] Office: [phone]   Thelma Comp 11/30/2021, 12:58 PM

## 2021-11-30 NOTE — Op Note (Addendum)
Preop diagnosis: L4-5 severe spinal stenosis with grade 2 anterolisthesis  Postop diagnosis: Same  Procedure: Gill procedure decompression with L4-5 bilateral posterolateral fusion.  Pedicle instrumentation.  (No interbody cage  due to collapsed disc space). Local bone . One level fusion.   Surgeon: Rodell Perna, MD  Assistant: [name], PA-C medically necessary and present for the entire procedure  Anesthesia: General oral tracheal plus Marcaine skin local.  EBL: 1000 cc.  Instruments:ROD 40MM SPINAL - [id number]  Inventory Item: ROD 40MM SPINAL Serial no.:  Model/Cat no.: [id number]  Implant name: ROD 40MM SPINAL - [id number] Laterality: N/A Area: Spine Lumbar  Manufacturer: [place] Date of Manufacture:    Action: Implanted Number Used: 1   Device Identifier:  Device Identifier Type:     ROD CREO 45MM SPINAL - [id number]  Inventory Item: ROD CREO 45MM SPINAL Serial no.:  Model/Cat no.: [id number]  Implant name: ROD CREO 45MM SPINAL - [id number] Laterality: N/A Area: Spine Lumbar  Manufacturer: [place] Date of Manufacture:    Action: Implanted Number Used: 1   Device Identifier:  Device Identifier Type:     CAP LOCKING THREADED [id number]  Inventory Item: CAP LOCKING THREADED Serial no.:  Model/Cat no.: [id number]  Implant name: CAP LOCKING THREADED [id number] Laterality: N/A Area: Spine Lumbar  Manufacturer: [employer] Date of Manufacture:    Action: Implanted Number Used: 4   Device Identifier:  Device Identifier Type:     SCREW 6.5X45 [id number]  Inventory Item: SCREW 6.5X45 Serial no.:  Model/Cat no.: [id number]  Implant name: SCREW 6.5X45 - [id number] Laterality: N/A Area: Spine Lumbar  Manufacturer: [place] Date of Manufacture:    Action: Implanted Number Used: 1   Device Identifier:  Device Identifier Type:    No interbody cage placed due to collapse of disc space.  Procedure: After induction of general anesthesia patient placed on the spine  frame prone after Foley catheter placed preoperative antibiotics timeout procedure completed.  Prepping with DuraPrep there is squared with towel sterile skin marker Betadine Steri-Drape and laminectomy sheets and drapes.  Midline incision was made C-arm was brought in L4-5 level was identified patient had had progressive collapse of the disc space over the last 2 years and there was 1 or 2 mm disc space which had S shaped route noted on lateral imaging.  Decompression was performed after bone to been marked after C arm sterilely draped image.  Decompression was performed moving thick chunks of ligament until the dura was round.  On the right side facet joint was removed and the disc base was exposed it was extremely collapsed fibrotic and with spreading lamina spreader there was minimal ability of the disc space.  I felt that even putting a small 6 mm cage and expandable type would cause more destruction to the endplates then simple instrumentation.  Pedicle screws were then placed sequentially checking intermittently with C arm using the C arm or drape AP and lateral to make sure all screws are directly down the pedicle of the bur was used pedicle feeler tapping feeling along the medial side of the pedicle and inferior aspect of the pedicle make sure there is no penetration protection of the dura and nerve roots and after all 4 screws were placed using [employer] instrumentation rods were selected 45 mm caps were applied locked and compressed.  Final spot images were taken with Bennett placed on the right side adjacent to the incision.  All screws are in good position there was  good decompression of the dura with around tube.  Surgi-Flo was used intermittently as well as regular cautery and bipolar cautery intermittently.  Patient been on aspirin which she stopped preop but there was continued bleeding intermittently throughout the case despite multiple times stopping using Surgiflo multiple patties.  This occurred  from the decorticated transverse processes with used with a small bur.  All bone that was removed from decompression was chipped and a small pieces after soft tissue was clean and half was packed on each side for intertransverse process lateral gutter fusion for stabilization.  I discussed with the wife and husband as we had had preoperative discussion that possibly cage would not be placed and I explained to them that due to progressive collapse of the disc space and stiffness that placed on the cage with likely caused more damage to the endplates having to try to create a disc base then simple instrumented lateral fusion with instrumentation.  Patient was neurologically intact in the recovery room.  Standard layered closure was used with #1 Vicryl 2-0 Vicryl skin staple closure Marcaine infiltration and postop dressing.

## 2021-11-30 NOTE — Evaluation (Signed)
Occupational Therapy Evaluation Patient Details Name: Dustin [name] MRN: [id number] DOB: [birth year] Today's Date: 11/30/2021   History of Present Illness 71 y.o. M admitted on 11/29/21 for a L4-5 bilateral fusion. PMH significant for CKD, CAD, GERD, HTN, hypothyroidis, S/P mitral valve repair.   Clinical Impression   Pt admitted for concerns listed above. PTA Pt reported that she was independent with all ADL's and IADL's, including driving and caring for his 24 y.o. father in law. At this time pt requires supervision for bed mobility and min guard for transfers due to balance deficits. Pt is able to complete all BADL's, following compensatory strategies with no assist. At this time, he has no further OT needs and acute OT will sign off.       Recommendations for follow up therapy are one component of a multi-disciplinary discharge planning process, led by the attending physician.  Recommendations may be updated based on patient status, additional functional criteria and insurance authorization.   Follow Up Recommendations  No OT follow up    Assistance Recommended at Discharge Set up Supervision/Assistance  Patient can return home with the following A little help with bathing/dressing/bathroom;Assist for transportation;Help with stairs or ramp for entrance    Functional Status Assessment  Patient has had a recent decline in their functional status and demonstrates the ability to make significant improvements in function in a reasonable and predictable amount of time.  Equipment Recommendations  BSC/3in1;Other (comment) (RW)    Recommendations for Other Services       Precautions / Restrictions Precautions Precautions: Back Precaution Booklet Issued: Yes (comment) Precaution Comments: Reviewed all precautions and compensatory strategies for ADL's. Required Braces or Orthoses: Spinal Brace Spinal Brace: Lumbar corset;Applied in sitting position Restrictions Weight Bearing  Restrictions: No      Mobility Bed Mobility Overal bed mobility: Needs Assistance Bed Mobility: Rolling;Sidelying to Sit Rolling: Supervision Sidelying to sit: Supervision       General bed mobility comments: Supervision to review and educate on log roll technique, no physical assist needed.    Transfers Overall transfer level: Needs assistance Equipment used: Rolling walker (2 wheels) Transfers: Sit to/from Stand Sit to Stand: Min guard           General transfer comment: Pt had 1 LoB where he leaned forward and caught himself on the wall in front of him, no physical assist needed, min guard for safety due to balance      Balance Overall balance assessment: Needs assistance Sitting-balance support: Feet supported Sitting balance-Leahy Scale: Good     Standing balance support: No upper extremity supported;During functional activity Standing balance-Leahy Scale: Fair Standing balance comment: 1 mild LoB in static standing, most likely due to pt feeling woozy                           ADL either performed or assessed with clinical judgement   ADL Overall ADL's : Modified independent                                       General ADL Comments: Pt able to complete all dressing and toileting/grooming this session with no difficulties. Reviewed compensatory strategies     Vision Baseline Vision/History: 1 Wears glasses Ability to See in Adequate Light: 0 Adequate Patient Visual Report: No change from baseline Vision Assessment?: No apparent visual deficits  Perception     Praxis      Pertinent Vitals/Pain Pain Assessment: No/denies pain     Hand Dominance Right   Extremity/Trunk Assessment Upper Extremity Assessment Upper Extremity Assessment: Overall WFL for tasks assessed   Lower Extremity Assessment Lower Extremity Assessment: Defer to PT evaluation   Cervical / Trunk Assessment Cervical / Trunk Assessment: Back  Surgery   Communication Communication Communication: No difficulties   Cognition Arousal/Alertness: Awake/alert Behavior During Therapy: WFL for tasks assessed/performed Overall Cognitive Status: Within Functional Limits for tasks assessed                                       General Comments  VSS on RA, pt reports mild feeling of woozyness intermittently    Exercises     Shoulder Instructions      Home Living Family/patient expects to be discharged to:: Private residence Living Arrangements: Spouse/significant other Available Help at Discharge: Family Type of Home: House Home Access: Stairs to enter [employer] of Steps: 4 steps Entrance Stairs-Rails: Can reach both Home Layout: Multi-level;Able to live on main level with bedroom/bathroom     Bathroom Shower/Tub: Producer, television/film/video: Handicapped height Bathroom Accessibility: Yes   Home Equipment: Shower seat - built in          Prior Functioning/Environment Prior Level of Function : Independent/Modified Independent;Driving                        OT Problem List: Decreased strength;Decreased activity tolerance;Impaired balance (sitting and/or standing);Decreased knowledge of use of DME or AE;Decreased knowledge of precautions;Pain      OT Treatment/Interventions:      OT Goals(Current goals can be found in the care plan section) Acute Rehab OT Goals Patient Stated Goal: to go home OT Goal Formulation: With patient Time For Goal Achievement: 12/14/21 Potential to Achieve Goals: Good  OT Frequency:      Co-evaluation              AM-PAC OT "6 Clicks" Daily Activity     Outcome Measure Help from another person eating meals?: None Help from another person taking care of personal grooming?: None Help from another person toileting, which includes using toliet, bedpan, or urinal?: None Help from another person bathing (including washing, rinsing, drying)?:  None Help from another person to put on and taking off regular upper body clothing?: None Help from another person to put on and taking off regular lower body clothing?: None 6 Click Score: 24   End of Session Equipment Utilized During Treatment: Back brace;Rolling walker (2 wheels) Nurse Communication: Mobility status  Activity Tolerance: Patient tolerated treatment well Patient left: in chair;with call bell/phone within reach  OT Visit Diagnosis: Unsteadiness on feet (R26.81);Other abnormalities of gait and mobility (R26.89);Muscle weakness (generalized) (M62.81)                Time: 9326-7124 OT Time Calculation (min): 33 min Charges:  OT General Charges $OT Visit: 1 Visit OT Evaluation $OT Eval Moderate Complexity: 1 Mod OT Treatments $Self Care/Home Management : 8-22 mins  Terrel Manalo H., OTR/L Acute Rehabilitation  Alizia Greif Elane Nabeeha Badertscher 11/30/2021, 10:04 AM

## 2021-11-30 NOTE — Plan of Care (Signed)
Pt doing well. Pt given D/C instructions with verbal understanding. Rx's were sent to the pharmacy by MD. Pt's incision is clean and dry with no sign of infection. Pt's IV was removed prior to D/C. Pt received RW from [employer] per MD order. Pt D/C'd home via wheelchair per MD order. Pt is stable @ D/C and has no other needs at this time. Kaelan Amble, RN  

## 2021-11-30 NOTE — Progress Notes (Signed)
Patient ID: Dustin [name], male   DOB: Mar 07, 1951, 71 y.o.   MRN: [id number]   Subjective: 1 Day Post-Op Procedure(s) (LRB): LUMBAR FOUR-FIVE GILL PROCEDURE, LEFT TRANSFORAMINAL LUMBAR INTERBODY FUSION, PEDICLE INSTRUMENTATION, LATERAL FUSION (N/A) Patient reports pain as moderate.    Objective: Vital signs in last 24 hours: Temp:  [97.3 F (36.3 C)-99.7 F (37.6 C)] 99 F (37.2 C) (01/12 0743) Pulse Rate:  [59-95] 90 (01/12 0743) Resp:  [13-20] 17 (01/12 0743) BP: (96-151)/(44-63) 102/56 (01/12 0743) SpO2:  [97 %-100 %] 98 % (01/12 0743)  Intake/Output from previous day: 01/11 0701 - 01/12 0700 In: 1850 [I.V.:1600; IV Piggyback:250] Out: 1750 [Urine:750; Blood:1000] Intake/Output this shift: No intake/output data recorded.  Recent Labs    11/29/21 1640 11/30/21 0612  HGB 9.5* 8.6*   Recent Labs    11/29/21 1640 11/30/21 0612  WBC 5.6 11.2*  RBC 3.07* 2.74*  HCT 27.3* 24.4*  PLT 125* 123*   Recent Labs    11/30/21 0612  NA 134*  K 4.6  CL 98  CO2 23  BUN 18  CREATININE 1.39*  GLUCOSE 152*  CALCIUM 10.0   No results for input(s): LABPT, INR in the last 72 hours.  Neurologically intact DG Lumbar Spine 2-3 Views  Result Date: 11/29/2021 CLINICAL DATA:  Lumbar fusion EXAM: LUMBAR SPINE - 2-3 VIEW COMPARISON:  07/04/2020, MRI 11/28/2020 FINDINGS: Two low resolution intraoperative spot views of the lumbar spine. Total fluoroscopy time was 35 seconds, fluoroscopy dose of 22.99 mGy. The images demonstrate posterior spinal rods and transpedicular screws at L4 and L5. IMPRESSION: Intraoperative fluoroscopic assistance provided during lumbar spine surgery Electronically Signed   By: Jasmine Pang M.D.   On: 11/29/2021 17:45   DG C-Arm 1-60 Min-No Report  Result Date: 11/29/2021 Fluoroscopy was utilized by the requesting physician.  No radiographic interpretation.   DG C-Arm 1-60 Min-No Report  Result Date: 11/29/2021 Fluoroscopy was utilized by the requesting  physician.  No radiographic interpretation.   DG C-Arm 1-60 Min-No Report  Result Date: 11/29/2021 Fluoroscopy was utilized by the requesting physician.  No radiographic interpretation.    Assessment/Plan: 1 Day Post-Op Procedure(s) (LRB): LUMBAR FOUR-FIVE GILL PROCEDURE, LEFT TRANSFORAMINAL LUMBAR INTERBODY FUSION, PEDICLE INSTRUMENTATION, LATERAL FUSION (N/A) Up with therapy, likely home this afternoon.   Acute blood loss anemia with drop Hgb 11.1 to 8.6.  ( intra op blood loss )   [name] 11/30/2021, 8:02 AM

## 2021-11-30 NOTE — Discharge Instructions (Signed)
Your dressing is waterproof you can shower with your dressing on.  Use your brace when you are up and ambulating.  Avoid bending stooping twisting.  Walk daily will help with your fusion.  After you walk you can lay down and rest.  No driving.  Follow-up with Dr. Ophelia Charter in 1 week.

## 2021-12-01 ENCOUNTER — Encounter (HOSPITAL_COMMUNITY): Payer: Self-pay | Admitting: Orthopaedic Surgery

## 2021-12-01 ENCOUNTER — Ambulatory Visit (INDEPENDENT_AMBULATORY_CARE_PROVIDER_SITE_OTHER): Payer: Medicare HMO | Admitting: Orthopaedic Surgery

## 2021-12-01 ENCOUNTER — Other Ambulatory Visit: Payer: Self-pay

## 2021-12-01 ENCOUNTER — Telehealth: Payer: Self-pay

## 2021-12-01 DIAGNOSIS — Z981 Arthrodesis status: Secondary | ICD-10-CM

## 2021-12-01 MED ORDER — ONDANSETRON HCL 4 MG PO TABS: 4.0000 mg | ORAL_TABLET | Freq: Every day | ORAL | 1 refills | Status: AC | PRN

## 2021-12-01 NOTE — Telephone Encounter (Signed)
Medication was entered as "no print". I resent to pharmacy. Spoke with patient's wife, bandage is about 2/3 full with active bleeding. Worked in for appointment this afternoon.

## 2021-12-01 NOTE — Discharge Summary (Signed)
Patient ID: Dustin [name] MRN: [id number] DOB/AGE: Oct 01, 1951 71 y.o.  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 male who was admitted 11/29/2021 for operative treatment of Lumbar stenosis. Patient failed conservative treatments (please see the history and physical for the specifics) and had severe unremitting pain that affects sleep, daily activities and work/hobbies. After pre-op clearance, the patient was taken to the operating room on 11/29/2021 and underwent  Procedure(s): LUMBAR FOUR-FIVE GILL PROCEDURE, LEFT TRANSFORAMINAL LUMBAR INTERBODY FUSION, PEDICLE INSTRUMENTATION, LATERAL FUSION.    Patient was given perioperative antibiotics:  Anti-infectives (From admission, onward)    Start     Dose/Rate Route Frequency Ordered Stop   11/29/21 1045  ceFAZolin (ANCEF) IVPB 2g/100 mL premix        2 g 200 mL/hr over 30 Minutes Intravenous On call to O.R. 11/29/21 1030 11/29/21 1321        Patient was given sequential compression devices and early ambulation to prevent DVT.   Patient  benefited maximally from hospital stay and there were no complications. At the time of discharge, the patient was urinating/moving their bowels without difficulty, tolerating a regular diet, pain is controlled with oral pain medications and they have been cleared by PT/OT.   Recent vital signs: No data found.   Recent laboratory studies:  Recent Labs    11/29/21 1640 11/30/21 0612  WBC 5.6 11.2*  HGB 9.5* 8.6*  HCT 27.3* 24.4*  PLT 125* 123*  NA  --  134*  K  --  4.6  CL  --  98  CO2  --  23  BUN  --  18  CREATININE  --  1.39*  GLUCOSE  --  152*  CALCIUM  --  10.0     Discharge Medications:   Allergies as of 11/30/2021       Reactions   Budesonide-formoterol Fumarate    Hoarseness, swollen vocal cords   Sulfamethoxazole Itching        Medication List     STOP taking these medications    acetaminophen 500 MG tablet Commonly known as: TYLENOL   ibuprofen 200 MG tablet Commonly known as: ADVIL       TAKE these medications    aspirin EC 81 MG tablet Take 81 mg by mouth daily.   atorvastatin 80 MG tablet Commonly known as: LIPITOR Take 80 mg by mouth daily.   chlorthalidone 25 MG tablet Commonly known as: HYGROTON Take 12.5 mg by mouth daily.   Cholecalciferol 125 MCG (5000 UT) Tabs Take 5,000 Units by mouth 2 (two) times daily.   esomeprazole 40 MG capsule Commonly known as: NEXIUM 40 mg daily  at 12 noon.   fluticasone 50 MCG/ACT nasal spray Commonly known as: FLONASE Place 2 sprays into both nostrils daily.   levothyroxine 50 MCG tablet Commonly known as: SYNTHROID Take 50 mcg by mouth daily before breakfast.   losartan 100 MG tablet Commonly known as: COZAAR Take 50 mg by mouth daily.   methocarbamol 500 MG tablet Commonly known as: Robaxin Take 1 tablet (500 mg total) by mouth every 8 (eight) hours as needed for muscle spasms.   metoprolol tartrate 25 MG tablet Commonly known as: LOPRESSOR Take 25 mg by mouth 2 (two) times daily.    Multi-Vitamin tablet Take 1 tablet by mouth daily. Balance of Nature   oxyCODONE-acetaminophen 5-325 MG tablet Commonly known as: Percocet Take 1-2 tablets by mouth every 4 (four) hours as needed for severe pain.   sildenafil 20 MG tablet Commonly known as: REVATIO Take 20 mg by mouth daily as needed (ed).        Diagnostic Studies: DG Chest 2 View  Result Date: 11/23/2021 CLINICAL DATA:  71 year old male with a history of preoperative chest x-ray EXAM: CHEST - 2 VIEW COMPARISON:  10/07/2010 FINDINGS: Cardiomediastinal silhouette unchanged in size and contour. Surgical changes of median sternotomy, CABG, mitral valve annuloplasty Aortic arch calcifications. No evidence of central vascular congestion. No interlobular septal thickening. No pneumothorax or pleural effusion. Coarsened interstitial markings, with no confluent airspace disease. No acute displaced fracture. Degenerative changes of the spine. IMPRESSION: Negative for acute cardiopulmonary disease. Surgical changes of median sternotomy, CABG, mitral valve annuloplasty Electronically Signed   By: Corrie Mckusick D.O.   On: 11/23/2021 16:37   DG Lumbar Spine 2-3 Views  Result Date: 11/29/2021 CLINICAL DATA:  Lumbar fusion EXAM: LUMBAR SPINE - 2-3 VIEW COMPARISON:  07/04/2020, MRI 11/28/2020 FINDINGS: Two low resolution intraoperative spot views of the lumbar spine. Total fluoroscopy time was 35 seconds, fluoroscopy dose of 22.99 mGy. The images demonstrate posterior spinal rods and transpedicular screws at L4 and L5. IMPRESSION: Intraoperative fluoroscopic assistance provided during lumbar spine surgery Electronically Signed   By: Donavan Foil M.D.   On: 11/29/2021 17:45   DG C-Arm 1-60 Min-No Report  Result Date: 11/29/2021 Fluoroscopy was utilized by the requesting physician.  No radiographic interpretation.   DG C-Arm 1-60 Min-No Report  Result Date: 11/29/2021 Fluoroscopy was utilized by the requesting physician.  No  radiographic interpretation.   DG C-Arm 1-60 Min-No Report  Result Date: 11/29/2021 Fluoroscopy was utilized by the requesting physician.  No radiographic interpretation.    Discharge Instructions     Incentive spirometry RT   Complete by: As directed         Follow-up Information     Marybelle Killings, MD Follow up in 1 week(s).   Specialty: Orthopedic Surgery Contact information: [address] [place] [address] [phone]                 Discharge Plan:  discharge to home  Disposition:     Signed: [name]  12/01/2021, 7:38 PM

## 2021-12-01 NOTE — Telephone Encounter (Signed)
Pt called into the office stating that the [facility] is stating that they do not have the Zofran.   Pts wife said they placed a waterproof bandage on her wound and that this am she noticed quite a bit of blood and drainage so she would like to know if this is normal or should she be concerned ?

## 2021-12-01 NOTE — Progress Notes (Signed)
° °  Post-Op Visit Note   Patient: Dustin [name]           Date of Birth: 01-22-1951           MRN: [id number] Visit Date: 12/01/2021 PCP: Dineen Kid, MD   Assessment & Plan: Patient had a lumbar fusion honeycomb dressing had some bleeding from the inferior aspect.  Discussed patient hunting, has minimal absorption.  Changed to Mepilex I gave him an extra Mepilex she can use if needed.  He is doing well postop follow-up next week as planned.  Chief Complaint:  Chief Complaint  Patient presents with   Lower Back - Wound Check    11/29/2021 L4-5 GILL procedure, left TLIF   Visit Diagnoses:  1. S/P lumbar fusion     Plan: New dressing applied return as scheduled  Follow-Up Instructions: No follow-ups on file.   Orders:  No orders of the defined types were placed in this encounter.  No orders of the defined types were placed in this encounter.   Imaging: No results found.  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  Past Surgical History:  Procedure Laterality Date   CARDIAC CATHETERIZATION  05/14/2017   hx ablation for A-flutter  11/01/2017   MITRAL VALVE REPAIR (MV)/CORONARY ARTERY BYPASS GRAFTING (CABG)  07/08/2017    SVG-OM, LAA closure, MV reconstruction with annuloplasty and sliding plasty via superior septal approach/Medtronic Simplici-T Annuloplasty System, left GSV harvest 07/08/17 by Jomarie Longs, MD at [place]     removed as a child   Social History   Occupational History   Not on file  Tobacco Use   Smoking status: Never   Smokeless tobacco: Never  Vaping Use   Vaping Use: Never used  Substance and Sexual Activity   Alcohol use: Yes    Alcohol/week: 1.0 standard drink    Types: 1 Glasses of wine per week    Comment: wine daily   Drug use: Never   Sexual activity: Not on file

## 2021-12-08 ENCOUNTER — Encounter: Payer: Self-pay | Admitting: Orthopaedic Surgery

## 2021-12-08 ENCOUNTER — Ambulatory Visit (INDEPENDENT_AMBULATORY_CARE_PROVIDER_SITE_OTHER): Payer: Medicare HMO

## 2021-12-08 ENCOUNTER — Other Ambulatory Visit: Payer: Self-pay

## 2021-12-08 ENCOUNTER — Ambulatory Visit (INDEPENDENT_AMBULATORY_CARE_PROVIDER_SITE_OTHER): Payer: Medicare HMO | Admitting: Orthopaedic Surgery

## 2021-12-08 VITALS — BP 117/67 | HR 74 | Ht 71.0 in | Wt 206.0 lb

## 2021-12-08 DIAGNOSIS — Z981 Arthrodesis status: Secondary | ICD-10-CM | POA: Diagnosis not present

## 2021-12-08 DIAGNOSIS — D62 Acute posthemorrhagic anemia: Secondary | ICD-10-CM

## 2021-12-08 LAB — HEMOGLOBIN AND HEMATOCRIT, BLOOD
HCT: 25.1 % — ABNORMAL LOW (ref 38.5–50.0)
Hemoglobin: 8.4 g/dL — ABNORMAL LOW (ref 13.2–17.1)

## 2021-12-08 LAB — EXTRA SPECIMEN

## 2021-12-08 NOTE — Progress Notes (Signed)
° °  Post-Op Visit Note   Patient: Dustin [name]           Date of Birth: 1951-01-06           MRN: [id number] Visit Date: 12/08/2021 PCP: Dineen Kid, MD   Assessment & Plan: 71 year old male returns post single level lumbar instrumented fusion.  Patient had collapse of the disc space and no interbody cage was placed.  X-rays look good.  He is felt a little tired and discharge hemoglobin was 8.6 he came in at 11.1 preop.  12 years ago it was 14.  He requested hemoglobin which we drew today.  He will return on Tuesday for staple removal.  He is taking minimal pain medication and is increasing his walking.  Chief Complaint:  Chief Complaint  Patient presents with   Lower Back - Routine Post Op    11/29/2021 L4-5 GILL procedure, Left TLIF   Visit Diagnoses:  1. Status post lumbar spinal fusion     Plan: Hemoglobin drawn.  Return Wednesday for staple removal and Steri-Strips.  Follow-Up Instructions: Return in about 5 days (around 12/13/2021).   Orders:  Orders Placed This Encounter  Procedures   XR Lumbar Spine 2-3 Views   No orders of the defined types were placed in this encounter.   Imaging: No results found.  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  Past Surgical History:  Procedure Laterality Date   CARDIAC CATHETERIZATION  05/14/2017   hx ablation for A-flutter  11/01/2017   MITRAL VALVE REPAIR (MV)/CORONARY ARTERY BYPASS GRAFTING (CABG)  07/08/2017   SVG-OM, LAA closure, MV reconstruction with annuloplasty and sliding plasty via superior septal approach/Medtronic Simplici-T Annuloplasty System, left GSV harvest 07/08/17 by Jomarie Longs, MD at [place]     removed as a child   Social History   Occupational History   Not on file  Tobacco Use   Smoking status: Never   Smokeless tobacco: Never  Vaping Use   Vaping Use: Never used  Substance and Sexual Activity   Alcohol use: Yes    Alcohol/week: 1.0 standard drink    Types: 1 Glasses of wine per week    Comment: wine daily   Drug use: Never   Sexual activity: Not on file

## 2021-12-13 ENCOUNTER — Encounter: Payer: Self-pay | Admitting: Orthopaedic Surgery

## 2021-12-13 ENCOUNTER — Ambulatory Visit (INDEPENDENT_AMBULATORY_CARE_PROVIDER_SITE_OTHER): Payer: Medicare HMO | Admitting: Orthopaedic Surgery

## 2021-12-13 ENCOUNTER — Other Ambulatory Visit: Payer: Self-pay

## 2021-12-13 VITALS — BP 109/64 | HR 80 | Ht 71.0 in | Wt 206.0 lb

## 2021-12-13 DIAGNOSIS — Z981 Arthrodesis status: Secondary | ICD-10-CM

## 2021-12-13 NOTE — Progress Notes (Signed)
° °  Post-Op Visit Note   Patient: Dustin [name]           Date of Birth: January 19, 1951           MRN: [id number] Visit Date: 12/13/2021 PCP: Dineen Kid, MD   Assessment & Plan: Follow-up lumbar fusion L4-5.  Staples harvested.  Hemoglobin was 8.4 we discussed that this will return back to normal in a few weeks.  Return in 2 months for repeat images lumbar spine.  Continue brace he can drive now.  He has been off his pain medication since last Thursday.  Chief Complaint:  Chief Complaint  Patient presents with   Lower Back - Routine Post Op, Follow-up    11/29/2021 L4-5 GILL procedure, left TLIF   Visit Diagnoses:  1. S/P lumbar fusion     Plan: Return 2 months repeat AP lateral lumbar x-rays on return.  Follow-Up Instructions: Return in about 2 months (around 02/10/2022).   Orders:  No orders of the defined types were placed in this encounter.  No orders of the defined types were placed in this encounter.   Imaging: No results found.  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  Past Surgical History:  Procedure Laterality Date   CARDIAC CATHETERIZATION  05/14/2017   hx ablation for A-flutter  11/01/2017   MITRAL VALVE REPAIR (MV)/CORONARY ARTERY BYPASS GRAFTING (CABG)   07/08/2017   SVG-OM, LAA closure, MV reconstruction with annuloplasty and sliding plasty via superior septal approach/Medtronic Simplici-T Annuloplasty System, left GSV harvest 07/08/17 by Jomarie Longs, MD at [facility]     removed as a child   Social History   Occupational History   Not on file  Tobacco Use   Smoking status: Never   Smokeless tobacco: Never  Vaping Use   Vaping Use: Never used  Substance and Sexual Activity   Alcohol use: Yes    Alcohol/week: 1.0 standard drink    Types: 1 Glasses of wine per week    Comment: wine daily   Drug use: Never   Sexual activity: Not on file

## 2022-02-07 ENCOUNTER — Encounter: Payer: Self-pay | Admitting: Orthopaedic Surgery

## 2022-02-07 ENCOUNTER — Other Ambulatory Visit: Payer: Self-pay

## 2022-02-07 ENCOUNTER — Ambulatory Visit: Payer: Self-pay

## 2022-02-07 ENCOUNTER — Ambulatory Visit (INDEPENDENT_AMBULATORY_CARE_PROVIDER_SITE_OTHER): Payer: Medicare HMO | Admitting: Orthopaedic Surgery

## 2022-02-07 VITALS — Ht 71.0 in | Wt 206.0 lb

## 2022-02-07 DIAGNOSIS — Z981 Arthrodesis status: Secondary | ICD-10-CM

## 2022-02-07 NOTE — Progress Notes (Signed)
? ?  Post-Op Visit Note ?  ?Patient: Dustin [name]           ?Date of Birth: 1951-02-19           ?MRN: [id number] ?Visit Date: 02/07/2022 ?PCP: Dineen Kid, MD ? ? ?Assessment & Plan: Post lumbar fusion.  Good relief preop pain.  He is walking 16,000 steps a day.  Occasionally has had seconds or a minute or so of some numbness but it resolves.  Patient has instrumented fusion and disc base was so tight no cage could be placed.  Return for final visit in 2 months he can cancel if he continues to do great. ? ?Chief Complaint:  ?Chief Complaint  ?Patient presents with  ? Lower Back - Follow-up  ?  11/29/2021 L4-5 GILL procedure, Left TLIF  ? ?Visit Diagnoses:  ?1. S/P lumbar fusion   ? ? ?Plan: Return 2 months. ? ?Follow-Up Instructions: Return if symptoms worsen or fail to improve.  ? ?Orders:  ?Orders Placed This Encounter  ?Procedures  ? XR Lumbar Spine 2-3 Views  ? ?No orders of the defined types were placed in this encounter. ? ? ?Imaging: ?No results found. ? ?PMFS History: ?Patient Active Problem List  ? Diagnosis Date Noted  ? S/P lumbar fusion 12/01/2021  ? Lumbar stenosis 11/29/2021  ? S/P mitral valve repair 07/01/2018  ? OSA (obstructive sleep apnea) 07/01/2018  ? GERD (gastroesophageal reflux disease) 07/01/2018  ? Hyperlipidemia 07/01/2018  ? Erectile dysfunction 07/01/2018  ? Hypothyroid 07/01/2018  ? ?Past Medical History:  ?Diagnosis Date  ? CKD (chronic kidney disease)   ? Coronary artery disease   ? GERD (gastroesophageal reflux disease) 07/01/2018  ? Heart murmur   ? 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 Hypertension   ? Hypothyroidism 07/01/2018  ? Paroxysmal A-fib ([place])   ? S/P mitral valve repair 07/08/2017  ? Spinal stenosis of lumbar region 08/22/2021  ?  ?No family history on file.  ?Past Surgical History:  ?Procedure Laterality Date  ? CARDIAC CATHETERIZATION  05/14/2017  ? hx ablation for A-flutter  11/01/2017  ? MITRAL VALVE REPAIR (MV)/CORONARY ARTERY BYPASS GRAFTING (CABG)   07/08/2017  ? SVG-OM, LAA closure, MV reconstruction with annuloplasty and sliding plasty via superior septal approach/Medtronic Simplici-T Annuloplasty System, left GSV harvest 07/08/17 by Jomarie Longs, MD at [facility]  ? TONSILLECTOMY    ? removed as a child  ? ?Social History  ? ?Occupational History  ? Not on file  ?Tobacco Use  ? Smoking status: Never  ? Smokeless tobacco: Never  ?Vaping Use  ? Vaping Use: Never used  ?Substance and Sexual Activity  ? Alcohol use: Yes  ?  Alcohol/week: 1.0 standard drink  ?  Types: 1 Glasses of wine per week  ?  Comment: wine daily  ? Drug use: Never  ? Sexual activity: Not on file  ? ? ? ?

## 2022-04-10 ENCOUNTER — Encounter: Payer: Self-pay | Admitting: Orthopaedic Surgery

## 2022-04-10 ENCOUNTER — Ambulatory Visit: Payer: Medicare HMO | Admitting: Orthopaedic Surgery

## 2022-04-10 VITALS — BP 122/66 | HR 69 | Ht 71.0 in | Wt 206.0 lb

## 2022-04-10 DIAGNOSIS — Z981 Arthrodesis status: Secondary | ICD-10-CM

## 2022-04-10 NOTE — Progress Notes (Unsigned)
Office Visit Note   Patient: Dustin [name]           Date of Birth: 1951/01/13           MRN: [id number] Visit Date: 04/10/2022              Requested by: [place] [address] [place],  [place] [address] PCP: Selina Cooley, MD   Assessment & Plan: Visit Diagnoses:  1. S/P lumbar fusion     Plan: We discussed as we did before surgery that he has some areas of narrowing at L2-3 and L3-4 with some disc bulging and some foraminal narrowing consistent with the symptoms.  This may improve with time we discussed avoiding activities that tend to bother her at the most.  He is got good relief of his neurogenic claudication symptoms.  Wife is present today discussed that she is concerned that he still has some ongoing pain symptoms with activity.  If his pain symptoms progress he can call about an epidural.  Follow-Up Instructions: Return if symptoms worsen or fail to improve.   Orders:  No orders of the defined types were placed in this encounter.  No orders of the defined types were placed in this encounter.     Procedures: No procedures performed   Clinical Data: No additional findings.   Subjective: Chief Complaint  Patient presents with   Lower Back - Follow-up    11/29/2021 L4-5 Gill procedure, TLIF    HPI patient returns 4 months postsurgery.  He continues to have some problems in his left anterior thigh we reviewed his preop MRI which showed severe near total occlusion at L4-5 with anterolisthesis and multifactorial stenosis but he also had some lateral disc bulging at L2-3 with foraminal narrowing and also L3-4 which were not surgically addressed.  He states that certain times when he bends at the waist turns twists as he leans on the sink outstretched arms sometimes he feels a sharp pain.  His wife notes that he sometimes vocalizes with certain positions.  He is able to flex with his hands to his ankle and knee straight without pain.  He has been walking  10,000 steps per day plus which she enjoys.  Review of Systems all the systems are noncontributory.   Objective: Vital Signs: BP 122/66   Pulse 69   Ht 5\' 11"  (1.803 m)   Wt 206 lb (93.4 kg)   BMI 28.73 kg/m   Physical Exam Constitutional:      Appearance: He is well-developed.  HENT:     Head: Normocephalic and atraumatic.     Right Ear: External ear normal.     Left Ear: External ear normal.  Eyes:     Pupils: Pupils are equal, round, and reactive to light.  Neck:     Thyroid: No thyromegaly.     Trachea: No tracheal deviation.  Cardiovascular:     Rate and Rhythm: Normal rate.  Pulmonary:     Effort: Pulmonary effort is normal.     Breath sounds: No wheezing.  Abdominal:     General: Bowel sounds are normal.     Palpations: Abdomen is soft.  Musculoskeletal:     Cervical back: Neck supple.  Skin:    General: Skin is warm and dry.     Capillary Refill: Capillary refill takes less than 2 seconds.  Neurological:     Mental Status: He is alert and oriented to person, place, and time.  Psychiatric:  Behavior: Behavior normal.        Thought Content: Thought content normal.        Judgment: Judgment normal.    Ortho Exam reflexes are intact he is able to heel and toe walk forward flexion fingertips to floor with knee straight.  Negative logroll hips knees reach full extension.  Specialty Comments:  No specialty comments available.  Imaging: IMPRESSION: 1. L4-5: Severe multifactorial spinal stenosis likely to cause neural compression. Considerable worsening since 2012. Contributing factors include facet arthropathy with 11 mm of anterolisthesis and bulging of the disc. 2. L2-3 and L3-4: Moderate disc bulges. Mild facet and ligamentous hypertrophy. Mild stenosis of the lateral recesses and foramina but without definite neural compression. Slight worsening since 2012.     Electronically Signed   By: Nelson Chimes M.D.   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  Past Surgical History:  Procedure Laterality Date   CARDIAC CATHETERIZATION  05/14/2017   hx ablation for A-flutter  11/01/2017   MITRAL VALVE REPAIR (MV)/CORONARY ARTERY BYPASS GRAFTING (CABG)  07/08/2017   SVG-OM, LAA closure, MV reconstruction with annuloplasty and sliding plasty via superior septal approach/Medtronic Simplici-T Annuloplasty System, left GSV harvest 07/08/17 by Jomarie Longs, MD at [facility]     removed as a child   Social History   Occupational History   Not on file  Tobacco Use   Smoking status: Never   Smokeless tobacco: Never  Vaping Use   Vaping Use: Never used  Substance and Sexual Activity   Alcohol use: Yes    Alcohol/week: 1.0 standard drink    Types: 1 Glasses of wine per week    Comment: wine daily   Drug use: Never   Sexual activity: Not on file

## 2022-05-04 IMAGING — RF DG LUMBAR SPINE 2-3V
1 series · 2 of 2 positions shown · non-contrast
Comparison: 07/04/2020, MRI 11/28/2020

CLINICAL DATA: Lumbar fusion

EXAM:
LUMBAR SPINE - 2-3 VIEW

[Series 1: run · 2 of 2 slices shown]
[im 1/2]
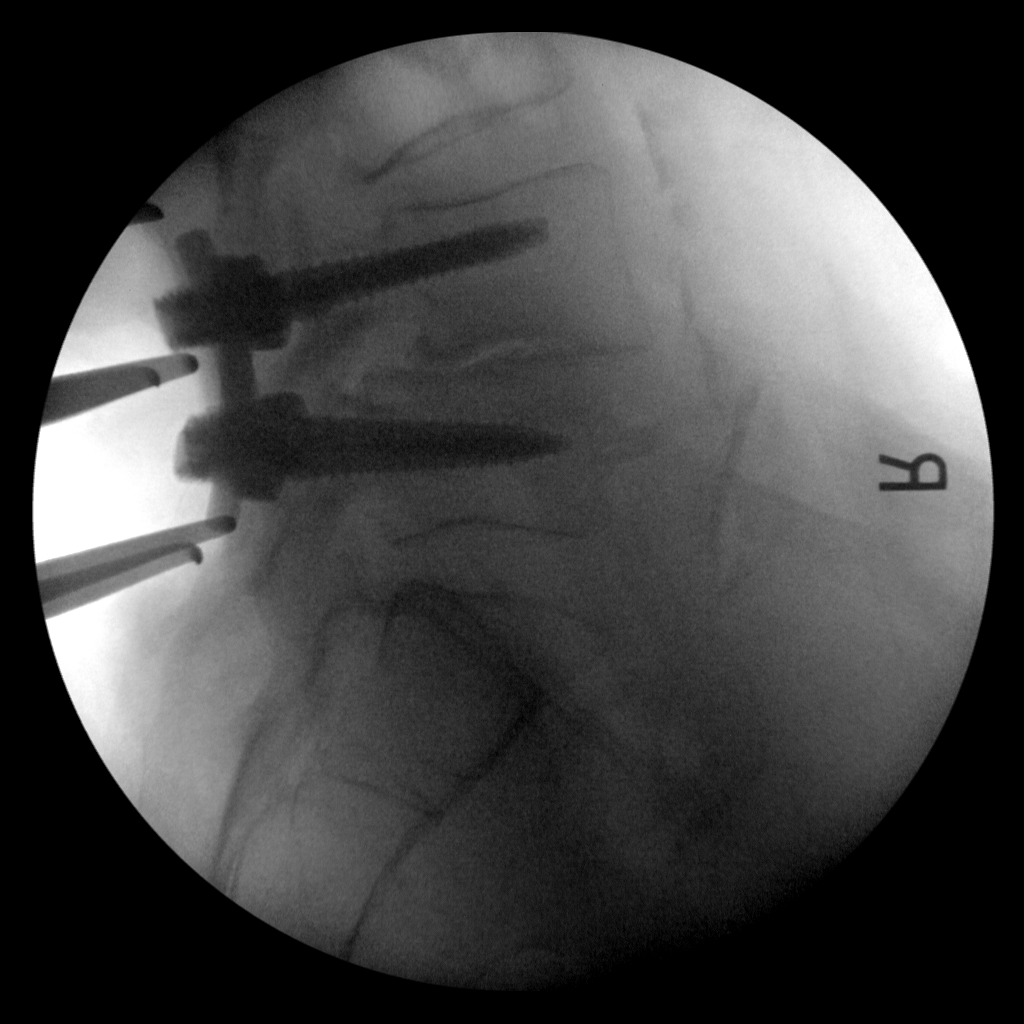
[im 2/2]
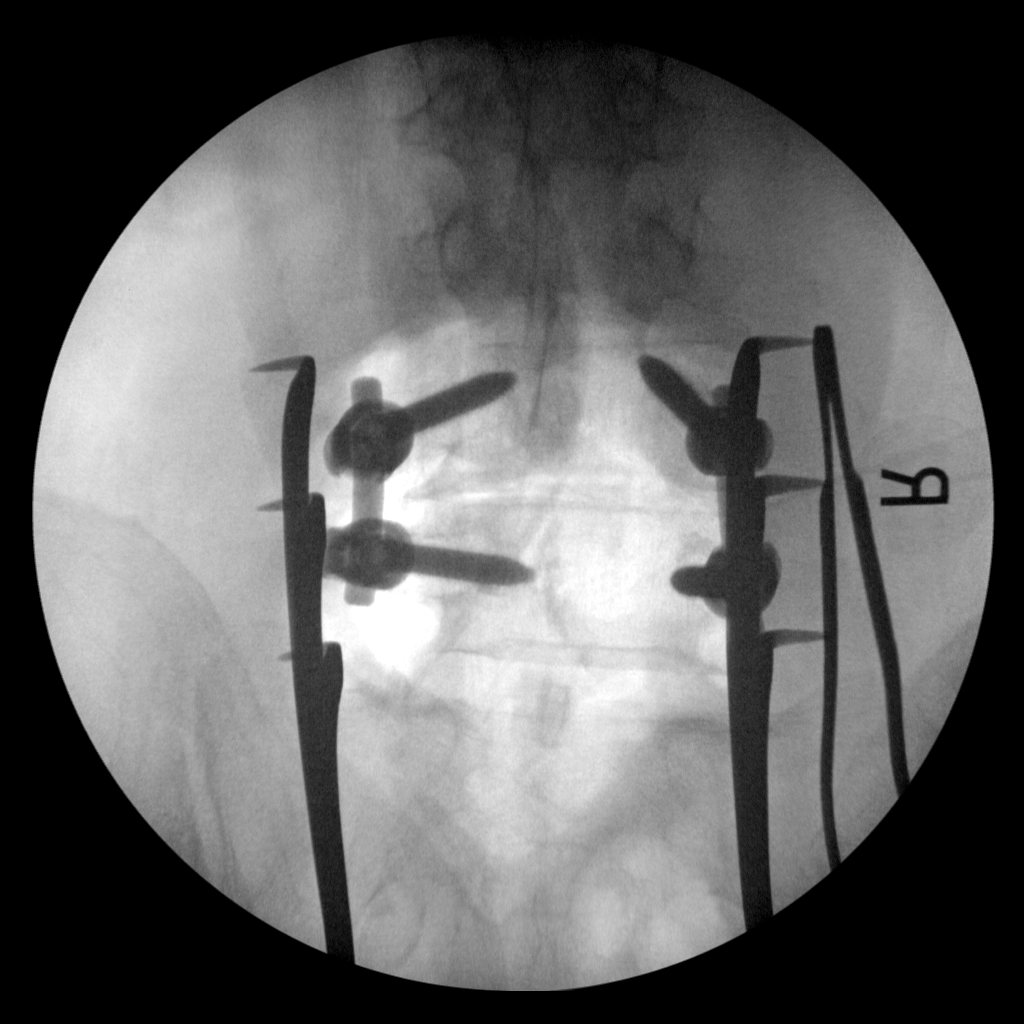

[2 of 2 positions shown; findings below may reference images not displayed]

FINDINGS: Two low resolution intraoperative spot views of the lumbar spine.
Total fluoroscopy time was 35 seconds, fluoroscopy dose of
mGy. The images demonstrate posterior spinal rods and transpedicular
screws at L4 and L5.
IMPRESSION: Intraoperative fluoroscopic assistance provided during lumbar spine
surgery

## 2022-08-22 ENCOUNTER — Encounter: Payer: Self-pay | Admitting: Orthopedic Surgery

## 2022-08-22 ENCOUNTER — Ambulatory Visit: Payer: Medicare HMO | Admitting: Orthopedic Surgery

## 2022-08-22 ENCOUNTER — Ambulatory Visit: Payer: Self-pay

## 2022-08-22 DIAGNOSIS — M25512 Pain in left shoulder: Secondary | ICD-10-CM | POA: Diagnosis not present

## 2022-08-22 NOTE — Progress Notes (Signed)
Office Visit Note   Patient: Dustin [name]           Date of Birth: 23-Dec-1950           MRN: [id number] Visit Date: 08/22/2022 Requested by: [place] [id number] [place],  [place] [address] PCP: Dineen Kid, MD  Subjective: Chief Complaint  Patient presents with   Left Shoulder - Pain    HPI: Dustin [name] is a 71 y.o. male who presents to the office with left shoulder pain.  He injured his left shoulder 3 months ago working out.  Reports pain in the posterior aspect of the left shoulder with associated weakness.  He is doing reasonly well from his back surgery.  Reports some mechanical symptoms in the shoulder which are new.  Does not report any bruising at the time of his injury.  Has tried to work out some since but it has been difficult due to the left shoulder pain..                ROS: All systems reviewed are negative as they relate to the chief complaint within the history of present illness.  Patient denies fevers or chills.  Assessment & Plan: Visit Diagnoses:  1. Left shoulder pain, unspecified chronicity     Plan: Impression is left shoulder rotator cuff tear likely infraspinatus based on exam.  Radiographs unremarkable.  Symptoms ongoing for 3 months with failure of conservative treatment including abrasion rehab program as well as over-the-counter medication.  Needs MRI arthrogram of the left shoulder to evaluate infraspinatus weakness and possible rotator cuff pathology.  No radiation into the biceps but I think biceps pathology is still possible.  Follow-up after that study.  Follow-Up Instructions: No follow-ups on file.   Orders:  Orders Placed This Encounter  Procedures   XR Shoulder Left   MR Shoulder Left w/ contrast   Arthrogram   No orders of the defined types were placed in this encounter.     Procedures: No procedures performed   Clinical Data: No additional findings.  Objective: Vital Signs: There were no vitals taken for this  visit.  Physical Exam:  Constitutional: Patient appears well-developed HEENT:  Head: Normocephalic Eyes:EOM are normal Neck: Normal range of motion Cardiovascular: Normal rate Pulmonary/chest: Effort normal Neurologic: Patient is alert Skin: Skin is warm Psychiatric: Patient has normal mood and affect  Ortho Exam: Ortho exam demonstrates full cervical spine range of motion.  5 out of 5 grip EPL FPL interosseous resection extension bicep triceps abductor strength.  He does have weakness to infraspinatus testing on the left compared to the right hip and forearm.  5 out of 5.  Mild crepitus with internal/external rotation of the left shoulder compared to the right.  No discrete AC joint tenderness is present.  No other masses lymphadenopathy or skin changes noted in that shoulder girdle region.  Passive range of motion is 60/95/170.  Bilateral shoulders.  Specialty Comments:  No specialty comments available.  Imaging: No results found.   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 No pertinent family history.  Past Surgical History:  Procedure Laterality Date   CARDIAC CATHETERIZATION  05/14/2017   hx ablation for A-flutter  11/01/2017   MITRAL VALVE REPAIR (MV)/CORONARY ARTERY BYPASS  GRAFTING (CABG)  07/08/2017   SVG-OM, LAA closure, MV reconstruction with annuloplasty and sliding plasty via superior septal approach/Medtronic Simplici-T Annuloplasty System, left GSV harvest 07/08/17 by Jomarie Longs, MD at [facility]     removed as a child   Social History   Occupational History   Not on file  Tobacco Use   Smoking status: Never   Smokeless tobacco: Never  Vaping Use   Vaping Use: Never used  Substance and Sexual Activity   Alcohol use: Yes    Alcohol/week: 1.0 standard drink of alcohol    Types: 1 Glasses of wine per week    Comment: wine daily   Drug use: Never   Sexual activity: Not on file

## 2022-09-11 ENCOUNTER — Ambulatory Visit
Admission: RE | Admit: 2022-09-11 | Discharge: 2022-09-11 | Disposition: A | Discharge status: Home / Self Care | Payer: Medicare HMO | Source: Ambulatory Visit | Attending: Orthopedic Surgery | Admitting: Orthopedic Surgery

## 2022-09-11 DIAGNOSIS — M25512 Pain in left shoulder: Secondary | ICD-10-CM

## 2022-09-11 MED ORDER — IOPAMIDOL (ISOVUE-M 200) INJECTION 41%
15.0000 mL | Freq: Once | INTRAMUSCULAR | Status: AC
  Administered 2022-09-11: 15 mL via INTRA_ARTICULAR

## 2022-09-17 ENCOUNTER — Ambulatory Visit: Payer: Medicare HMO | Admitting: Orthopedic Surgery

## 2022-09-17 DIAGNOSIS — B0229 Other postherpetic nervous system involvement: Secondary | ICD-10-CM | POA: Diagnosis not present

## 2022-09-20 ENCOUNTER — Encounter: Payer: Self-pay | Admitting: Orthopedic Surgery

## 2022-09-20 NOTE — Progress Notes (Signed)
Office Visit Note   Patient: Dustin [name]           Date of Birth: 1951-02-07           MRN: [id number] Visit Date: 09/17/2022 Requested by: [place] [address] [place],  [place] [address] PCP: Selina Cooley, MD  Subjective: Chief Complaint  Patient presents with   Other     Scan review    HPI: Dustin [name] is a 71 y.o. male who presents to the office reporting left shoulder pain and weakness.  Since he was last seen he had an MRI scan on that left shoulder.  That scan shows degenerative anterior labral tear with no retracted rotator cuff tear no muscle atrophy.  He reports global pain in that left shoulder no mechanical symptoms.  No history of dislocation.  Ibuprofen daily helps.  Does have a history of using the shingles vaccine.  Pain has been going on for about 4 to 6 weeks.  He describes having a rash developed over the last 2 weeks which is pruritic..                ROS: All systems reviewed are negative as they relate to the chief complaint within the history of present illness.  Patient denies fevers or chills.  Assessment & Plan: Visit Diagnoses: No diagnosis found.  Plan: Impression is typical shingles vesicular rash which is resolving involving the left upper extremity.  Patient has some biceps deltoid and external rotation weakness on exam today.  This is consistent with shingles involvement in the nervous system.  No operative pathology of the rotator cuff or anything to explain his shoulder her weakness on MRI scan.  Plan at this time is 8-week return for clinical recheck.  No intervention required at this time.  I do want him to try to maintain as much passive range of motion to the shoulder as possible  Follow-Up Instructions: No follow-ups on file.   Orders:  No orders of the defined types were placed in this encounter.  No orders of the defined types were placed in this encounter.     Procedures: No procedures performed   Clinical Data: No  additional findings.  Objective: Vital Signs: There were no vitals taken for this visit.  Physical Exam:  Constitutional: Patient appears well-developed HEENT:  Head: Normocephalic Eyes:EOM are normal Neck: Normal range of motion Cardiovascular: Normal rate Pulmonary/chest: Effort normal Neurologic: Patient is alert Skin: Skin is warm Psychiatric: Patient has normal mood and affect  Ortho Exam: Ortho exam demonstrates a vesicular rash which is resolving in the left deltoid region.  He does have 4- out of 5 weakness in the biceps deltoid as well as with external rotation.  Not much muscle atrophy in the shoulder girdle.  No coarse grinding or crepitus with internal/external rotation of that left arm.  Passive range of motion is 70/95/170.  Specialty Comments:  No specialty comments available.  Imaging: No results found.   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  Past Surgical History:  Procedure Laterality Date   CARDIAC CATHETERIZATION  05/14/2017   hx ablation for A-flutter  11/01/2017   MITRAL VALVE REPAIR (MV)/CORONARY ARTERY BYPASS GRAFTING (CABG)  07/08/2017   SVG-OM, LAA closure, MV reconstruction with annuloplasty and sliding plasty via superior septal  approach/Medtronic Simplici-T Annuloplasty System, left GSV harvest 07/08/17 by Jomarie Longs, MD at [facility]     removed as a child   Social History   Occupational History   Not on file  Tobacco Use   Smoking status: Never   Smokeless tobacco: Never  Vaping Use   Vaping Use: Never used  Substance and Sexual Activity   Alcohol use: Yes    Alcohol/week: 1.0 standard drink of alcohol    Types: 1 Glasses of wine per week    Comment: wine daily   Drug use: Never   Sexual activity: Not on file

## 2022-11-07 ENCOUNTER — Ambulatory Visit (INDEPENDENT_AMBULATORY_CARE_PROVIDER_SITE_OTHER): Payer: Medicare HMO

## 2022-11-07 ENCOUNTER — Ambulatory Visit: Payer: Medicare HMO | Admitting: Orthopedic Surgery

## 2022-11-07 DIAGNOSIS — M25512 Pain in left shoulder: Secondary | ICD-10-CM

## 2022-11-07 DIAGNOSIS — M48062 Spinal stenosis, lumbar region with neurogenic claudication: Secondary | ICD-10-CM

## 2022-11-07 DIAGNOSIS — M79602 Pain in left arm: Secondary | ICD-10-CM | POA: Diagnosis not present

## 2022-11-08 ENCOUNTER — Encounter: Payer: Self-pay | Admitting: Orthopedic Surgery

## 2022-11-08 NOTE — Progress Notes (Signed)
Office Visit Note   Patient: Dustin [name]           Date of Birth: Jun 03, 1951           MRN: [id number] Visit Date: 11/07/2022 Requested by: [place] [id number] [place],  [place] [address] PCP: Selina Cooley, MD  Subjective: Chief Complaint  Patient presents with   Left Shoulder - Follow-up    HPI: Dustin [name] is a 71 y.o. male who presents to the office reporting continued left shoulder weakness.  He states that his rash is better.  Pain not "quite as bad".  He is trying to come off ibuprofen.  He does describe multiple joint pains.  There is a question of weakness and a shuffling gait.  He also states that he has a "crick" in his neck.  He has had this now for several months.  We have tried multiple conservative treatment options.  He also has a history of back surgery..                ROS: All systems reviewed are negative as they relate to the chief complaint within the history of present illness.  Patient denies fevers or chills.  Assessment & Plan: Visit Diagnoses:  1. Left shoulder pain, unspecified chronicity   2. Left arm pain   3. Spinal stenosis of lumbar region with neurogenic claudication     Plan: Impression is continued left shoulder weakness which is fairly profound compared to the right-hand side.  Working diagnosis was resolving shingles with continued symptoms from postherpetic neuralgia.  However there is significant degenerative disc disease within the cervical spine on radiographs.  Plan at this time is cervical spine MRI to evaluate for left-sided radiculopathy.  EMG nerve study of the left upper extremity to evaluate postherpetic neuralgia versus radiculopathy versus Parsonage-Turner syndrome.  He also needs L-spine ES I for stenosis symptoms with Dr. Alvester Morin.  Follow-up after the cervical spine MRI.  Follow-Up Instructions: No follow-ups on file.   Orders:  Orders Placed This Encounter  Procedures   XR Cervical Spine 2 or 3 views   MR  Cervical Spine w/o contrast   Ambulatory referral to Physical Medicine Rehab   Ambulatory referral to Physical Medicine Rehab   No orders of the defined types were placed in this encounter.     Procedures: No procedures performed   Clinical Data: No additional findings.  Objective: Vital Signs: There were no vitals taken for this visit.  Physical Exam:  Constitutional: Patient appears well-developed HEENT:  Head: Normocephalic Eyes:EOM are normal Neck: Normal range of motion Cardiovascular: Normal rate Pulmonary/chest: Effort normal Neurologic: Patient is alert Skin: Skin is warm Psychiatric: Patient has normal mood and affect  Ortho Exam: Ortho exam demonstrates weakness with external rotation and deltoid strength at 3+ out of 5 on the left compared to 5+ out of 5 on the right.  No coarse grinding or crepitus with internal/external rotation on the left-hand side.  The rash in the mid humeral region which appeared to be vesicular previously has improved.  Motor or sensory function to the hand is intact.  Cervical spine range of motion is actually pretty good.  He does have a slightly smaller stride length compared to previous exams but it is not particularly significant.  Does describe having some weakness going up and down stairs.  No nerve root tension signs in the lower extremities.  No paresthesias L1-S1.  Specialty Comments:  No specialty comments available.  Imaging: XR Cervical Spine 2 or 3 views  Result Date: 11/08/2022 AP lateral radiographs cervical spine reviewed.  No acute fracture.  Significant degenerative disc disease and facet arthritis is present beginning at C4 and extending down the cervical spine.  Normal lordosis is maintained.  No spondylolisthesis.    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 No pertinent family history.  Past Surgical History:  Procedure Laterality Date   CARDIAC CATHETERIZATION  05/14/2017   hx ablation for A-flutter  11/01/2017   MITRAL VALVE REPAIR (MV)/CORONARY ARTERY BYPASS GRAFTING (CABG)  07/08/2017   SVG-OM, LAA closure, MV reconstruction with annuloplasty and sliding plasty via superior septal approach/Medtronic Simplici-T Annuloplasty System, left GSV harvest 07/08/17 by Duanne Limerick, [facility] at [facility]   TONSILLECTOMY     removed as a child   Social History   Occupational History   Not on file  Tobacco Use   Smoking status: Never   Smokeless tobacco: Never  Vaping Use   Vaping Use: Never used  Substance and Sexual Activity   Alcohol use: Yes    Alcohol/week: 1.0 standard drink of alcohol    Types: 1 Glasses of wine per week    Comment: wine daily   Drug use: Never   Sexual activity: Not on file

## 2022-11-09 ENCOUNTER — Other Ambulatory Visit: Payer: Self-pay | Admitting: Physical Medicine and Rehabilitation

## 2022-11-09 DIAGNOSIS — M5416 Radiculopathy, lumbar region: Secondary | ICD-10-CM

## 2022-11-09 NOTE — Progress Notes (Signed)
Duplicate, please disregard. Thanks

## 2022-11-26 ENCOUNTER — Ambulatory Visit: Payer: Medicare HMO | Admitting: Physical Medicine and Rehabilitation

## 2022-11-26 ENCOUNTER — Ambulatory Visit: Payer: Self-pay

## 2022-11-26 VITALS — BP 148/69 | HR 67

## 2022-11-26 DIAGNOSIS — M5416 Radiculopathy, lumbar region: Secondary | ICD-10-CM

## 2022-11-26 MED ORDER — METHYLPREDNISOLONE ACETATE 80 MG/ML IJ SUSP
80.0000 mg | Freq: Once | INTRAMUSCULAR | Status: AC
  Administered 2022-11-26: 80 mg

## 2022-11-26 NOTE — Patient Instructions (Signed)

## 2022-11-26 NOTE — Progress Notes (Signed)
Functional Pain Scale - descriptive words and definitions  Mild (2)   Noticeable when not distracted/no impact on ADL's/sleep only slightly affected and able to   use both passive and active distraction for comfort. Mild range order  Average Pain 4   +Driver, -BT, -Dye Allergies.  Lower back pain on left side with no radiation

## 2022-11-30 ENCOUNTER — Encounter: Payer: Self-pay | Admitting: Orthopedic Surgery

## 2022-11-30 ENCOUNTER — Ambulatory Visit: Payer: Medicare HMO | Admitting: Physical Medicine and Rehabilitation

## 2022-11-30 DIAGNOSIS — R202 Paresthesia of skin: Secondary | ICD-10-CM

## 2022-11-30 DIAGNOSIS — R531 Weakness: Secondary | ICD-10-CM

## 2022-11-30 NOTE — Progress Notes (Signed)
Functional Pain Scale - descriptive words and definitions  No Pain (0)   No Pain/Loss of function  Average Pain 0  Rule out post herpetic neuralgia vs radic vs parsonage syndrome on left arm. Has weakness in left arm that goes to the hand

## 2022-12-04 ENCOUNTER — Ambulatory Visit
Admission: RE | Admit: 2022-12-04 | Discharge: 2022-12-04 | Disposition: A | Discharge status: Home / Self Care | Payer: Medicare HMO | Source: Ambulatory Visit | Attending: Orthopedic Surgery | Admitting: Orthopedic Surgery

## 2022-12-04 DIAGNOSIS — M79602 Pain in left arm: Secondary | ICD-10-CM

## 2022-12-05 ENCOUNTER — Encounter: Payer: Self-pay | Admitting: Orthopedic Surgery

## 2022-12-05 ENCOUNTER — Ambulatory Visit (INDEPENDENT_AMBULATORY_CARE_PROVIDER_SITE_OTHER): Payer: Medicare HMO

## 2022-12-05 ENCOUNTER — Ambulatory Visit: Payer: Medicare HMO | Admitting: Orthopedic Surgery

## 2022-12-05 VITALS — BP 119/73 | HR 67 | Ht 71.0 in | Wt 213.0 lb

## 2022-12-05 DIAGNOSIS — M542 Cervicalgia: Secondary | ICD-10-CM

## 2022-12-05 DIAGNOSIS — M5412 Radiculopathy, cervical region: Secondary | ICD-10-CM

## 2022-12-05 NOTE — Progress Notes (Addendum)
Orthopedic Spine Surgery Office Note  Assessment: Patient is a 72 y.o. male with left-sided deltoid and external rotation of the shoulder weakness.  Has multilevel foraminal stenosis on the left side that could explain his weakness   Plan: -Since he is not having any pain, did not recommend any pain medications or steroids or injections -I discussed physical therapy to work on strengthening.  I also told him he can work on home exercises. -I explained that the longer we wait to intervene on weakness, the more likely it will persist after surgical intervention -He is currently okay with his level of functioning and strength, so he does not want to pursue surgical intervention at this time.  We did briefly discuss laminoforaminotomies as an option for his weakness.  I told him that I would use the EMG/NCS results to help guide which levels to do since he has multiple levels with foraminal stenosis.  If he were to develop myelopathy, it would involve laminectomies and a larger posterior fusion. -He has cervical stenosis on his advanced imaging, so I will continue to monitor him for myelopathy -Patient should return to office in 6 weeks, x-rays at next visit: none   Patient expressed understanding of the plan and all questions were answered to the patient's satisfaction.   ___________________________________________________________________________   History:  Patient is a 72 y.o. male who presents today for cervical spine.  Patient has had about 3 to 4 months of left shoulder weakness.  He states there is no trauma or injury that brought on the weakness.  Feels it is weaker with overhead activities.  Weakness has been stable since onset.  There are no symptoms on the right side.  He has not had symptoms like this before.  He did have a rash over the area and there was concern for possible shingles as an etiology.  He has no pain radiating into either extremity.  He is currently able to function and  feels that he is activities of daily living done.  Denies paresthesias and numbness.    Weakness: Yes, left shoulder Difficulty with fine motor skills (e.g., buttoning shirts, handwriting): Denies Symptoms of imbalance: Denies Paresthesias and numbness: Denies Bowel or bladder incontinence: Denies Saddle anesthesia: Denies  Treatments tried: Tylenol, activity modification  Review of systems: Denies fevers and chills, night sweats, unexplained weight loss, history of cancer, pain that wakes them at night  Past medical history: Hypertension Coronary disease Chronic kidney disease Heart failure GERD COPD Chronic pain  Allergies: Sulfa, budesonide-formoterol   Past surgical history:  Mitral valve repair L4-5 laminectomy and fusion Heart ablation Tonsillectomy  Social history: Denies use of nicotine product (smoking, vaping, patches, smokeless) Alcohol use: Yes, 7 drinks per week Denies recreational drug use  Physical Exam:  General: no acute distress, appears stated age Neurologic: alert, answering questions appropriately, following commands Respiratory: unlabored breathing on room air, symmetric chest rise Psychiatric: appropriate affect, normal cadence to speech   MSK (spine):  -Strength exam      Left  Right Grip strength                5/5  5/5 Interosseus   5/5   5/5 Wrist extension  5/5  5/5 Wrist flexion   5/5  5/5 Elbow flexion   5/5  5/5 Deltoid    4/5  5/5  4/5 strength with external rotation with arm at side on the left side, 5/5 on the right side  5/5 strength in all lower extremity myotomes  except 4/5 with bilateral EHL  -Sensory exam    Sensation intact to light touch in L3-S1 nerve distributions of bilateral lower extremities  Sensation intact to light touch in C5-T1 nerve distributions of bilateral upper extremities  -Brachioradialis DTR: 1/4 on the left, 1/4 on the right -Biceps DTR: 0/4 on the left, 1/4 on the right -Achilles DTR: 1/4  on the left, 1/4 on the right -Patellar tendon DTR: 2/4 on the left, 2/4 on the right  -Spurling: Negative bilaterally -Hoffman sign: Negative bilaterally -Clonus: No beats bilaterally -Interosseous wasting: None seen  -Grip and release test: Negative -Romberg: Negative -Gait: Normal -Imbalance with tandem gait: No -Able to perform heel and toe walking without imbalance  Left shoulder exam: No pain through range of motion Right shoulder exam: No pain to range of motion  Tinel's at wrist: Negative bilaterally Durkan's: Negative bilaterally  Tinel's at elbow: Negative bilaterally  Imaging: XR of the cervical spine from 12/05/2022 and 12/202/2023 was independently reviewed and interpreted, showing disc height loss at C3/4, C4/5, C5/6, C6/7. Lordotic alignment. T1 slope of 33 degrees. C2 slope of 12 degrees. Spondylolisthesis at C2/3 measures 2.75mm on flexion and reduces on extension.  MRI of the cervical spine from 12/04/2022 was independently reviewed and interpreted, showing central and foraminal stenosis bilaterally at C3/4. Central and foraminal stenosis bilaterally at C4/5. Bilateral foraminal stenosis at C5/6. Right sided foraminal stenosis at C6/7. DDD at C3/4, C4/5, C5/6, C6/7.    Patient name: Dustin [name] Patient MRN: [id number] Date of visit: 12/05/22

## 2022-12-06 NOTE — Procedures (Signed)
EMG & NCV Findings: Evaluation of the left median motor nerve showed decreased conduction velocity (Elbow-Wrist, 47 m/s).  The left radial motor nerve showed reduced amplitude (1.6 mV) and decreased conduction velocity (Up Arm-8cm, 56 m/s).  The left ulnar motor nerve showed prolonged distal onset latency (4.3 ms), decreased conduction velocity (B Elbow-Wrist, 47 m/s), and decreased conduction velocity (A Elbow-B Elbow, 48 m/s).  The left median (across palm) sensory nerve showed prolonged distal peak latency (Wrist, 4.8 ms) and prolonged distal peak latency (Palm, 2.9 ms).  The left ulnar sensory nerve showed prolonged distal peak latency (4.8 ms), reduced amplitude (4.9 V), and decreased conduction velocity (Wrist-5th Digit, 29 m/s).  All remaining nerves (as indicated in the following tables) were within normal limits.    Needle evaluation of the left deltoid and the left biceps muscles showed increased insertional activity, widespread spontaneous activity, increased motor unit amplitude, and diminished recruitment.  The left supraspinatus muscle showed moderately increased spontaneous activity.  All remaining muscles (as indicated in the following table) showed no evidence of electrical instability.    Impression: The above electrodiagnostic study is ABNORMAL and reveals evidence of severe acute C5 and C6 radiculopathy on the left.  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 I do not have experience where shingles caused an axonal radiculopathy.  There is no significant electrodiagnostic evidence of any other focal nerve entrapment, brachial plexopathy or generalized peripheral neuropathy.   Recommendations: 1.  Follow-up with referring physician. 2.  Continue current management of symptoms. 3.  Suggest MRI and surgical evaluation. MRI of the cervical MRI is pending.  ___________________________ [employer] Board Certified, American Board of Physical Medicine and Rehabilitation    Nerve Conduction Studies Anti Sensory Summary Table   Stim Site NR Peak (ms) Norm Peak (ms) P-T Amp (V) Norm P-T Amp Site1 Site2 Delta-P (ms) Dist (cm) Vel (m/s) Norm Vel (m/s)  Left Med Ante Brach Cutan Anti Sensory (Med Forearm)  29.6C  Elbow    3.6  12.5  Elbow Med Forearm 3.6 14.0 39   Left Median Acr Palm Anti Sensory (2nd Digit)  29.7C  Wrist    *4.8 <3.6 16.9 >10 Wrist Palm 1.9 0.0    Palm    *2.9 <2.0 28.8         Left Radial Anti Sensory (Base 1st Digit)  29.4C  Wrist    2.9 <3.1 12.3  Wrist Base 1st Digit 2.9 0.0    Left Ulnar Anti Sensory (5th Digit)  29.8C  Wrist    *4.8 <3.7 *4.9 >15.0 Wrist 5th Digit 4.8 14.0 *29 >38   Motor Summary Table   Stim Site NR Onset (ms) Norm Onset (ms) O-P Amp (mV) Norm O-P Amp Site1 Site2 Delta-0 (ms) Dist (cm) Vel (m/s) Norm Vel (m/s)  Left Median Motor (Abd Poll Brev)  29.7C  Wrist    3.9 <4.2 8.2 >5 Elbow Wrist 5.2 24.5 *47 >50  Elbow    9.1  4.2         Left Radial Motor (Ext Indicis)  28.8C  8cm    2.5 <2.5 *1.6 >1.7 Up Arm 8cm 4.5 25.0 *56 >60  Up Arm    7.0  2.9         Left Ulnar Motor (Abd Dig Min)  29.6C  Wrist    *4.3 <4.2 4.0 >3 B Elbow Wrist 5.1 24.0 *47 >53  B Elbow  9.4  3.2  A Elbow B Elbow 2.1 10.0 *48 >53  A Elbow    11.5  3.5          EMG   Side Muscle Nerve Root Ins Act Fibs Psw Amp Dur Poly Recrt Int Fraser Din Comment  Left 1stDorInt Ulnar C8-T1 Nml Nml Nml Nml Nml 0 Nml Nml   Left Abd Poll Brev Median C8-T1 Nml Nml Nml Nml Nml 0 Nml Nml   Left Triceps Radial C6-7-8 Nml Nml Nml Nml Nml 0 Nml Nml   Left Deltoid Axillary C5-6 *Incr *4+ *4+ *Incr Nml 0 *Reduced Nml   Left Ext Digitorum  Radial (Post Int) C7-8 Nml Nml Nml Nml Nml 0 Nml Nml   Left Biceps Musculocut C5-6 *Incr *4+ *4+ *Incr Nml 0 *Reduced Nml   Left PronatorTeres Median C6-7 Nml Nml Nml Nml Nml 0 Nml Nml   Left Supraspinatus SupraScap C5-6 Nml *2+ *2+ Nml Nml 0 Nml  Nml     Nerve Conduction Studies Anti Sensory Left/Right Comparison   Stim Site L Lat (ms) R Lat (ms) L-R Lat (ms) L Amp (V) R Amp (V) L-R Amp (%) Site1 Site2 L Vel (m/s) R Vel (m/s) L-R Vel (m/s)  Med Ante Brach Cutan Anti Sensory (Med Forearm)  29.6C  Elbow 3.6   12.5   Elbow Med Forearm 39    Median Acr Palm Anti Sensory (2nd Digit)  29.7C  Wrist *4.8   16.9   Wrist Palm     Palm *2.9   28.8         Radial Anti Sensory (Base 1st Digit)  29.4C  Wrist 2.9   12.3   Wrist Base 1st Digit     Ulnar Anti Sensory (5th Digit)  29.8C  Wrist *4.8   *4.9   Wrist 5th Digit *29     Motor Left/Right Comparison   Stim Site L Lat (ms) R Lat (ms) L-R Lat (ms) L Amp (mV) R Amp (mV) L-R Amp (%) Site1 Site2 L Vel (m/s) R Vel (m/s) L-R Vel (m/s)  Median Motor (Abd Poll Brev)  29.7C  Wrist 3.9   8.2   Elbow Wrist *47    Elbow 9.1   4.2         Radial Motor (Ext Indicis)  28.8C  8cm 2.5   *1.6   Up Arm 8cm *56    Up Arm 7.0   2.9         Ulnar Motor (Abd Dig Min)  29.6C  Wrist *4.3   4.0   B Elbow Wrist *47    B Elbow 9.4   3.2   A Elbow B Elbow *48    A Elbow 11.5   3.5            Waveforms:

## 2022-12-06 NOTE — Progress Notes (Signed)
Dustin [name] - 72 y.o. male MRN [id number]  Date of birth: [birth year]  Office Visit Note: Visit Date: 11/30/2022 PCP: Selina Cooley, MD Referred by: Cammy Copa, MD  Subjective: Chief Complaint  Patient presents with   Left Arm - Weakness   HPI:  Dustin [name] is a 72 y.o. male who comes in today at the request of Dr. Burnard Bunting for evaluation and management of chronic, worsening and severe pain, numbness and tingling in the Left upper extremities.  Patient is Right hand dominant.  He initially saw Dr. August Saucer with left shoulder pain that he felt like he injured doing some activity in the summer.  He has not improved with conservative care and then was also complaining of a crick in his neck and neck pain and then he started having weakness in the arm.  He reports weakness with shoulder abduction and bicep flexion and just his arm in general.  He does get some weakness even in the hand.  He had no prior cervical surgery.  He has had prior lumbar fusion.  We have seen him recently for lumbar injection.  He is complaining of shuffling gait.  He has MRI of the cervical spine pending in a few days.  In the interim of Dr. August Saucer seen him for his shoulder and he has developed a rash that was felt to be shingles.  He still continues to have some mild rash with itching.  He is not really having much in the way of severe pain but he is having paresthesias and weakness.   ROS Otherwise per HPI.  Assessment & Plan: Impression: The above electrodiagnostic study is ABNORMAL and reveals evidence of severe acute C5 and C6 radiculopathy on the left.  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 I do not have experience where shingles caused an axonal radiculopathy.  There is no significant electrodiagnostic evidence of any other focal nerve entrapment, brachial plexopathy or generalized peripheral neuropathy.    Recommendations: 1.  Follow-up with referring physician. 2.  Continue current management of symptoms. 3.  Suggest MRI and surgical evaluation. MRI of the cervical MRI is pending.  Visit Diagnoses:    ICD-10-CM   1. Paresthesia of skin  R20.2 NCV with EMG (electromyography)    2. Weakness  R53.1       Plan:   Meds & Orders: No orders of the defined types were placed in this encounter.   Orders Placed This Encounter  Procedures   NCV with EMG (electromyography)    Follow-up: No follow-ups on file.   Procedures: No procedures performed  EMG & NCV Findings: Evaluation of the left median motor nerve showed decreased conduction velocity (Elbow-Wrist, 47 m/s).  The left radial motor nerve showed reduced amplitude (1.6 mV) and decreased conduction velocity (Up Arm-8cm, 56 m/s).  The left ulnar motor nerve showed prolonged distal onset latency (4.3 ms), decreased conduction velocity (B Elbow-Wrist, 47 m/s), and decreased conduction velocity (A Elbow-B Elbow, 48 m/s).  The left median (across palm) sensory nerve showed prolonged distal peak latency (Wrist, 4.8 ms) and prolonged distal peak latency (Palm, 2.9 ms).  The left ulnar sensory nerve showed prolonged distal peak latency (4.8 ms), reduced amplitude (4.9 V), and decreased conduction velocity (Wrist-5th Digit, 29 m/s).  All remaining nerves (as indicated in the following tables) were within normal limits.    Needle evaluation of the left deltoid and  the left biceps muscles showed increased insertional activity, widespread spontaneous activity, increased motor unit amplitude, and diminished recruitment.  The left supraspinatus muscle showed moderately increased spontaneous activity.  All remaining muscles (as indicated in the following table) showed no evidence of electrical instability.    Impression: The above electrodiagnostic study is ABNORMAL and reveals evidence of severe acute C5 and C6 radiculopathy on the left.  There is also  evidence of a mild left median nerve entrapment at the wrist (carpal tunnel syndrome) affecting sensory components. I do not have experience where shingles caused an axonal radiculopathy.  There is no significant electrodiagnostic evidence of any other focal nerve entrapment, brachial plexopathy or generalized peripheral neuropathy.   Recommendations: 1.  Follow-up with referring physician. 2.  Continue current management of symptoms. 3.  Suggest MRI and surgical evaluation. MRI of the cervical MRI is pending.  ___________________________ [employer] Board Certified, American Board of Physical Medicine and Rehabilitation    Nerve Conduction Studies Anti Sensory Summary Table   Stim Site NR Peak (ms) Norm Peak (ms) P-T Amp (V) Norm P-T Amp Site1 Site2 Delta-P (ms) Dist (cm) Vel (m/s) Norm Vel (m/s)  Left Med Ante Brach Cutan Anti Sensory (Med Forearm)  29.6C  Elbow    3.6  12.5  Elbow Med Forearm 3.6 14.0 39   Left Median Acr Palm Anti Sensory (2nd Digit)  29.7C  Wrist    *4.8 <3.6 16.9 >10 Wrist Palm 1.9 0.0    Palm    *2.9 <2.0 28.8         Left Radial Anti Sensory (Base 1st Digit)  29.4C  Wrist    2.9 <3.1 12.3  Wrist Base 1st Digit 2.9 0.0    Left Ulnar Anti Sensory (5th Digit)  29.8C  Wrist    *4.8 <3.7 *4.9 >15.0 Wrist 5th Digit 4.8 14.0 *29 >38   Motor Summary Table   Stim Site NR Onset (ms) Norm Onset (ms) O-P Amp (mV) Norm O-P Amp Site1 Site2 Delta-0 (ms) Dist (cm) Vel (m/s) Norm Vel (m/s)  Left Median Motor (Abd Poll Brev)  29.7C  Wrist    3.9 <4.2 8.2 >5 Elbow Wrist 5.2 24.5 *47 >50  Elbow    9.1  4.2         Left Radial Motor (Ext Indicis)  28.8C  8cm    2.5 <2.5 *1.6 >1.7 Up Arm 8cm 4.5 25.0 *56 >60  Up Arm    7.0  2.9         Left Ulnar Motor (Abd Dig Min)  29.6C  Wrist    *4.3 <4.2 4.0 >3 B Elbow Wrist 5.1 24.0 *47 >53  B Elbow    9.4  3.2  A Elbow B Elbow 2.1 10.0 *48 >53  A Elbow    11.5  3.5          EMG   Side Muscle Nerve Root Ins Act  Fibs Psw Amp Dur Poly Recrt Int Fraser Din Comment  Left 1stDorInt Ulnar C8-T1 Nml Nml Nml Nml Nml 0 Nml Nml   Left Abd Poll Brev Median C8-T1 Nml Nml Nml Nml Nml 0 Nml Nml   Left Triceps Radial C6-7-8 Nml Nml Nml Nml Nml 0 Nml Nml   Left Deltoid Axillary C5-6 *Incr *4+ *4+ *Incr Nml 0 *Reduced Nml   Left Ext Digitorum  Radial (Post Int) C7-8 Nml Nml Nml Nml Nml 0 Nml Nml   Left Biceps Musculocut C5-6 *Incr *4+ *4+ *Incr Nml 0 *Reduced  Nml   Left PronatorTeres Median C6-7 Nml Nml Nml Nml Nml 0 Nml Nml   Left Supraspinatus SupraScap C5-6 Nml *2+ *2+ Nml Nml 0 Nml Nml     Nerve Conduction Studies Anti Sensory Left/Right Comparison   Stim Site L Lat (ms) R Lat (ms) L-R Lat (ms) L Amp (V) R Amp (V) L-R Amp (%) Site1 Site2 L Vel (m/s) R Vel (m/s) L-R Vel (m/s)  Med Ante Brach Cutan Anti Sensory (Med Forearm)  29.6C  Elbow 3.6   12.5   Elbow Med Forearm 39    Median Acr Palm Anti Sensory (2nd Digit)  29.7C  Wrist *4.8   16.9   Wrist Palm     Palm *2.9   28.8         Radial Anti Sensory (Base 1st Digit)  29.4C  Wrist 2.9   12.3   Wrist Base 1st Digit     Ulnar Anti Sensory (5th Digit)  29.8C  Wrist *4.8   *4.9   Wrist 5th Digit *29     Motor Left/Right Comparison   Stim Site L Lat (ms) R Lat (ms) L-R Lat (ms) L Amp (mV) R Amp (mV) L-R Amp (%) Site1 Site2 L Vel (m/s) R Vel (m/s) L-R Vel (m/s)  Median Motor (Abd Poll Brev)  29.7C  Wrist 3.9   8.2   Elbow Wrist *47    Elbow 9.1   4.2         Radial Motor (Ext Indicis)  28.8C  8cm 2.5   *1.6   Up Arm 8cm *56    Up Arm 7.0   2.9         Ulnar Motor (Abd Dig Min)  29.6C  Wrist *4.3   4.0   B Elbow Wrist *47    B Elbow 9.4   3.2   A Elbow B Elbow *48    A Elbow 11.5   3.5            Waveforms:                 Clinical History: EXAM: MRI LUMBAR SPINE WITHOUT CONTRAST   TECHNIQUE: Multiplanar, multisequence MR imaging of the lumbar spine was performed. No intravenous contrast was administered.   COMPARISON:  10/16/2011    FINDINGS: Segmentation:  5 lumbar type vertebral bodies.   Alignment: 11 mm of anterolisthesis at L4-5, increased from 7 mm on the study of 20/12.   Vertebrae: No fracture or primary bone lesion. Chronic discogenic endplate marrow changes at T11-12, L2-3 and L4-5. No active edematous change. Insignificant hemangioma within the right posterior corner of the T11 vertebral body.   Conus medullaris and cauda equina: Conus extends to the T12-L1 level. Conus and cauda equina appear normal.   Paraspinal and other soft tissues: Negative   Disc levels:   T11-12: Disc degeneration with mild disc bulge. No compressive stenosis.   T12-L1 and L1-2: Normal.   L2-3: Desiccation and moderate bulging of the disc. Mild facet and ligamentous prominence. Mild stenosis of both lateral recesses and foramina but without definite neural compression. Slight worsening since 2012.   L3-4: Moderate bulging of the disc. Mild facet and ligamentous hypertrophy. Mild stenosis of the lateral recesses and foramina but without definite neural compression. Slight worsening since 2012.   L4-5: Chronic facet arthropathy, with 11 mm of anterolisthesis. Chronic disc degeneration with loss of disc height. Severe multifactorial stenosis of the canal and neural foramina likely to cause neural compression. Considerable  worsening since 2012.   L5-S1: Mild bulging of the disc.  No canal or foraminal stenosis.   IMPRESSION: 1. L4-5: Severe multifactorial spinal stenosis likely to cause neural compression. Considerable worsening since 2012. Contributing factors include facet arthropathy with 11 mm of anterolisthesis and bulging of the disc. 2. L2-3 and L3-4: Moderate disc bulges. Mild facet and ligamentous hypertrophy. Mild stenosis of the lateral recesses and foramina but without definite neural compression. Slight worsening since 2012.     Electronically Signed   By: Nelson Chimes M.D.   On: 11/28/2020 15:54      Objective:  VS:  HT:    WT:   BMI:     BP:   HR: bpm  TEMP: ( )  RESP:  Physical Exam Musculoskeletal:     Cervical back: Tenderness present.     Comments: Inspection reveals no atrophy of the bilateral APB or FDI or hand intrinsics some atrophy of the left deltoid and left biceps. There is no swelling, color changes, allodynia or dystrophic changes. There is 5 out of 5 strength in the bilateral wrist extension, finger abduction and long finger flexion. There is intact sensation to light touch in all dermatomal and peripheral nerve distributions. There is a negative Froment's test bilaterally. There is a negative Tinel's test at the bilateral wrist and elbow. There is a negative Phalen's test bilaterally. There is a negative Hoffmann's test bilaterally.  Skin:    General: Skin is warm and dry.     Findings: No erythema or rash.  Neurological:     General: No focal deficit present.     Mental Status: He is alert and oriented to person, place, and time.     Sensory: No sensory deficit.     Motor: No weakness or abnormal muscle tone.     Coordination: Coordination normal.     Gait: Gait normal.  Psychiatric:        Mood and Affect: Mood normal.        Behavior: Behavior normal.        Thought Content: Thought content normal.      Imaging: No results found.

## 2022-12-07 ENCOUNTER — Ambulatory Visit: Payer: Medicare HMO | Admitting: Podiatry

## 2022-12-07 ENCOUNTER — Encounter: Payer: Self-pay | Admitting: Podiatry

## 2022-12-07 DIAGNOSIS — L600 Ingrowing nail: Secondary | ICD-10-CM

## 2022-12-07 DIAGNOSIS — B351 Tinea unguium: Secondary | ICD-10-CM

## 2022-12-07 NOTE — Patient Instructions (Signed)

## 2022-12-08 NOTE — Progress Notes (Addendum)
Dustin [name] - 72 y.o. male MRN [id number]  Date of birth: [birth year]  Office Visit Note: Visit Date: 11/26/2022 PCP: Selina Cooley, MD Referred by: Selina Cooley, MD  Subjective: Chief Complaint  Patient presents with   Lower Back - Pain   HPI:  Dustin [name] is a 72 y.o. male who comes in today at the request of Dr. Burnard Bunting for planned Left L3-4 Lumbar Interlaminar epidural steroid injection with fluoroscopic guidance.  The patient has failed conservative care including home exercise, medications, time and activity modification.  This injection will be diagnostic and hopefully therapeutic.  Please see requesting physician notes for further details and justification.  **Injection report should read L3-4   ROS Otherwise per HPI.  Assessment & Plan: Visit Diagnoses:    ICD-10-CM   1. Lumbar radiculopathy  M54.16 XR C-ARM NO REPORT    Epidural Steroid injection    methylPREDNISolone acetate (DEPO-MEDROL) injection 80 mg      Plan: No additional findings.   Meds & Orders:  Meds ordered this encounter  Medications   methylPREDNISolone acetate (DEPO-MEDROL) injection 80 mg    Orders Placed This Encounter  Procedures   XR C-ARM NO REPORT   Epidural Steroid injection    Follow-up: Return for visit to requesting provider as needed.   Procedures: No procedures performed  Lumbar Epidural Steroid Injection - Interlaminar Approach with Fluoroscopic Guidance  Patient: Dustin [name]      Date of Birth: [birth year] MRN: [id number] PCP: Selina Cooley, MD      Visit Date: 11/26/2022   Universal Protocol:     Consent Given By: the patient  Position: PRONE  Additional Comments: Vital signs were monitored before and after the procedure. Patient was prepped and draped in the usual sterile fashion. The correct patient, procedure, and site was verified.   Injection Procedure Details:   Procedure diagnoses: Lumbar radiculopathy [M54.16]   Meds Administered:  Meds  ordered this encounter  Medications   methylPREDNISolone acetate (DEPO-MEDROL) injection 80 mg     Laterality: Left  Location/Site:  L4-5  Needle: 3.5 in., 20 ga. Tuohy  Needle Placement: Paramedian epidural  Findings:   -Comments: Excellent flow of contrast into the epidural space.  Procedure Details: Using a paramedian approach from the side mentioned above, the region overlying the inferior lamina was localized under fluoroscopic visualization and the soft tissues overlying this structure were infiltrated with 4 ml. of 1% Lidocaine without Epinephrine. The Tuohy needle was inserted into the epidural space using a paramedian approach.   The epidural space was localized using loss of resistance along with counter oblique bi-planar fluoroscopic views.  After negative aspirate for air, blood, and CSF, a 2 ml. volume of Isovue-250 was injected into the epidural space and the flow of contrast was observed. Radiographs were obtained for documentation purposes.    The injectate was administered into the level noted above.   Additional Comments:  No complications occurred Dressing: 2 x 2 sterile gauze and Band-Aid    Post-procedure details: Patient was observed during the procedure. Post-procedure instructions were reviewed.  Patient left the clinic in stable condition.   Clinical History: EXAM: MRI LUMBAR SPINE WITHOUT CONTRAST   TECHNIQUE: Multiplanar, multisequence MR imaging of the lumbar spine was performed. No intravenous contrast was administered.   COMPARISON:  10/16/2011   FINDINGS: Segmentation:  5 lumbar type vertebral bodies.   Alignment: 11 mm of anterolisthesis at L4-5, increased from 7 mm on the study of 20/12.  Vertebrae: No fracture or primary bone lesion. Chronic discogenic endplate marrow changes at T11-12, L2-3 and L4-5. No active edematous change. Insignificant hemangioma within the right posterior corner of the T11 vertebral body.   Conus  medullaris and cauda equina: Conus extends to the T12-L1 level. Conus and cauda equina appear normal.   Paraspinal and other soft tissues: Negative   Disc levels:   T11-12: Disc degeneration with mild disc bulge. No compressive stenosis.   T12-L1 and L1-2: Normal.   L2-3: Desiccation and moderate bulging of the disc. Mild facet and ligamentous prominence. Mild stenosis of both lateral recesses and foramina but without definite neural compression. Slight worsening since 2012.   L3-4: Moderate bulging of the disc. Mild facet and ligamentous hypertrophy. Mild stenosis of the lateral recesses and foramina but without definite neural compression. Slight worsening since 2012.   L4-5: Chronic facet arthropathy, with 11 mm of anterolisthesis. Chronic disc degeneration with loss of disc height. Severe multifactorial stenosis of the canal and neural foramina likely to cause neural compression. Considerable worsening since 2012.   L5-S1: Mild bulging of the disc.  No canal or foraminal stenosis.   IMPRESSION: 1. L4-5: Severe multifactorial spinal stenosis likely to cause neural compression. Considerable worsening since 2012. Contributing factors include facet arthropathy with 11 mm of anterolisthesis and bulging of the disc. 2. L2-3 and L3-4: Moderate disc bulges. Mild facet and ligamentous hypertrophy. Mild stenosis of the lateral recesses and foramina but without definite neural compression. Slight worsening since 2012.     Electronically Signed   By: Paulina Fusi M.D.   On: 11/28/2020 15:54     Objective:  VS:  HT:    WT:   BMI:     BP:(!) 148/69  HR:67bpm  TEMP: ( )  RESP:  Physical Exam Vitals and nursing note reviewed.  Constitutional:      General: He is not in acute distress.    Appearance: Normal appearance. He is not ill-appearing.  HENT:     Head: Normocephalic and atraumatic.     Right Ear: External ear normal.     Left Ear: External ear normal.     Nose:  No congestion.  Eyes:     Extraocular Movements: Extraocular movements intact.  Cardiovascular:     Rate and Rhythm: Normal rate.     Pulses: Normal pulses.  Pulmonary:     Effort: Pulmonary effort is normal. No respiratory distress.  Abdominal:     General: There is no distension.     Palpations: Abdomen is soft.  Musculoskeletal:        General: No tenderness or signs of injury.     Cervical back: Neck supple.     Right lower leg: No edema.     Left lower leg: No edema.     Comments: Patient has good distal strength without clonus.  Skin:    Findings: No erythema or rash.  Neurological:     General: No focal deficit present.     Mental Status: He is alert and oriented to person, place, and time.     Sensory: No sensory deficit.     Motor: No weakness or abnormal muscle tone.     Coordination: Coordination normal.  Psychiatric:        Mood and Affect: Mood normal.        Behavior: Behavior normal.      Imaging: No results found.

## 2022-12-08 NOTE — Procedures (Signed)
Lumbar Epidural Steroid Injection - Interlaminar Approach with Fluoroscopic Guidance  Patient: Dustin [name]      Date of Birth: Nov 03, 1951 MRN: [id number] PCP: Dineen Kid, MD      Visit Date: 11/26/2022   Universal Protocol:     Consent Given By: the patient  Position: PRONE  Additional Comments: Vital signs were monitored before and after the procedure. Patient was prepped and draped in the usual sterile fashion. The correct patient, procedure, and site was verified.   Injection Procedure Details:   Procedure diagnoses: Lumbar radiculopathy [M54.16]   Meds Administered:  Meds ordered this encounter  Medications   methylPREDNISolone acetate (DEPO-MEDROL) injection 80 mg     Laterality: Left  Location/Site:  L4-5  Needle: 3.5 in., 20 ga. Tuohy  Needle Placement: Paramedian epidural  Findings:   -Comments: Excellent flow of contrast into the epidural space.  Procedure Details: Using a paramedian approach from the side mentioned above, the region overlying the inferior lamina was localized under fluoroscopic visualization and the soft tissues overlying this structure were infiltrated with 4 ml. of 1% Lidocaine without Epinephrine. The Tuohy needle was inserted into the epidural space using a paramedian approach.   The epidural space was localized using loss of resistance along with counter oblique bi-planar fluoroscopic views.  After negative aspirate for air, blood, and CSF, a 2 ml. volume of Isovue-250 was injected into the epidural space and the flow of contrast was observed. Radiographs were obtained for documentation purposes.    The injectate was administered into the level noted above.   Additional Comments:  No complications occurred Dressing: 2 x 2 sterile gauze and Band-Aid    Post-procedure details: Patient was observed during the procedure. Post-procedure instructions were reviewed.  Patient left the clinic in stable condition.

## 2022-12-10 NOTE — Progress Notes (Signed)
Subjective:   Patient ID: Dustin [name], male   DOB: 72 y.o.   MRN: [id number]   HPI Patient presents with painful ingrown toenail of the big toe of 2 months duration.  Patient states it has been sore and slightly red he has not noted any drainage or bleeding.  Patient does not smoke likes to be active   Review of Systems  All other systems reviewed and are negative.       Objective:  Physical Exam Vitals and nursing note reviewed.  Constitutional:      Appearance: He is well-developed.  Pulmonary:     Effort: Pulmonary effort is normal.  Musculoskeletal:        General: Normal range of motion.  Skin:    General: Skin is warm.  Neurological:     Mental Status: He is alert.     Neurovascular status found to be intact muscle strength found to be adequate range of motion adequate.  Patient is noted to have good digital perfusion well oriented and on the right big toe there is incurvation of the lateral border that is painful when pressed and make shoe gear difficult.  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  I infiltrated the right hallux 60 mg Xylocaine Marcaine mixture sterile prep done and using sterile instrumentation remove the lateral border exposed matrix applied phenol 3 applications 30 seconds followed by alcohol lavage and sterile dressing and gave instructions on soaks and to wear dressing 24 hours but take it off earlier if throbbing were to occur

## 2022-12-13 ENCOUNTER — Ambulatory Visit: Payer: Medicare HMO | Admitting: Rehabilitative and Restorative Service Providers"

## 2022-12-13 ENCOUNTER — Encounter: Payer: Self-pay | Admitting: Rehabilitative and Restorative Service Providers"

## 2022-12-13 ENCOUNTER — Other Ambulatory Visit: Payer: Self-pay

## 2022-12-13 DIAGNOSIS — R293 Abnormal posture: Secondary | ICD-10-CM

## 2022-12-13 DIAGNOSIS — M6281 Muscle weakness (generalized): Secondary | ICD-10-CM | POA: Diagnosis not present

## 2022-12-13 DIAGNOSIS — M542 Cervicalgia: Secondary | ICD-10-CM | POA: Diagnosis not present

## 2022-12-13 DIAGNOSIS — M25512 Pain in left shoulder: Secondary | ICD-10-CM

## 2022-12-13 DIAGNOSIS — G8929 Other chronic pain: Secondary | ICD-10-CM

## 2022-12-13 NOTE — Therapy (Signed)
OUTPATIENT PHYSICAL THERAPY EVALUATION   Patient Name: Dustin [name] MRN: [id number] DOB:06/23/1951, 72 y.o., male Today's Date: 12/13/2022  END OF SESSION:  PT End of Session - 12/13/22 1027     Visit Number 1    Number of Visits 24    Date for PT Re-Evaluation 03/07/23    Authorization Type [employer] $25 copay    Authorization - Visit Number 1    Authorization - Number of Visits 12    PT Start Time 1057    PT Stop Time 1125    PT Time Calculation (min) 28 min    Activity Tolerance Patient tolerated treatment well    Behavior During Therapy WFL for tasks assessed/performed             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   Hypertension    Hypothyroidism 07/01/2018   Paroxysmal A-fib (HCC)    S/P mitral valve repair 07/08/2017   Spinal stenosis of lumbar region 08/22/2021   Past Surgical History:  Procedure Laterality Date   CARDIAC CATHETERIZATION  05/14/2017   hx ablation for A-flutter  11/01/2017   MITRAL VALVE REPAIR (MV)/CORONARY ARTERY BYPASS GRAFTING (CABG)  07/08/2017   SVG-OM, LAA closure, MV reconstruction with annuloplasty and sliding plasty via superior septal approach/Medtronic Simplici-T Annuloplasty System, left GSV harvest 07/08/17 by Duanne Limerick, MD at [facility]   TONSILLECTOMY     removed as a child   Patient Active Problem List   Diagnosis Date Noted   S/P lumbar fusion 12/01/2021   Lumbar stenosis 11/29/2021   S/P mitral valve repair 07/01/2018   OSA (obstructive sleep apnea) 07/01/2018   GERD (gastroesophageal reflux disease) 07/01/2018   Hyperlipidemia 07/01/2018   Erectile dysfunction 07/01/2018   Hypothyroid 07/01/2018    PCP: Selina Cooley MD  REFERRING PROVIDER: London Sheer, MD  REFERRING DIAG: M54.12 (ICD-10-CM) - Radiculopathy, cervical region  THERAPY  DIAG:  Cervicalgia - Plan: PT plan of care cert/re-cert  Chronic left shoulder pain - Plan: PT plan of care cert/re-cert  Muscle weakness (generalized) - Plan: PT plan of care cert/re-cert  Abnormal posture - Plan: PT plan of care cert/re-cert  Rationale for Evaluation and Treatment: Rehabilitation  ONSET DATE: approx. 06/2022  SUBJECTIVE:  SUBJECTIVE STATEMENT: He indicated onset of weakness in Lt shoulder insidious.  He reported seeing Dr. Marlou Sa initially, possible shingles were present at that time.  He stated continued symptoms building and then ended up seeing Dr. Laurance Flatten.  He indicated having imaging done in neck.  He indicated progressive weakness with some symptoms in neck/shoulder as well as some weakness at times.  Indicated no trouble sleeping.   PERTINENT HISTORY:  CKD, CAD, GERD, HTN, hypothyroidism, lumbar spinal stenosis  PAIN:  NPRS scale: at worst 3-4/10 Pain location: Lt neck/shoulder Pain description: stiff, weak Aggravating factors: end ranges at times, carrying heavier weight Relieving factors: general neck mobility exercises  PRECAUTIONS: None  WEIGHT BEARING RESTRICTIONS: No  FALLS:  Has patient fallen in last 6 months? No   LIVING ENVIRONMENT: Lives in: House/apartment   OCCUPATION: Retired  PLOF: Independent, [employer] about 1x/week to 2x/week (has been limited), Rt handed.  Wife has chickens and he helps take care.   PATIENT GOALS:  Loosen up and get stronger, improve balance.     OBJECTIVE:   IMAGING REVIEW: 12/13/2022:  MRI IMPRESSION: 1. Advanced degenerative changes in the cervical spine as detailed above resulting in moderate to severe spinal canal stenosis at C3-C4 and severe spinal canal stenosis at C4-C5. No convincing cord  signal abnormality. 2. Multilevel severe neural foraminal stenosis as detailed above. 3. Marrow edema in the posterior elements on the right at C3-C4 on the left at C4-C5 which may reflect a source of pain.  PATIENT SURVEYS:  12/13/2022 FOTO intake:  64  predicted:  71  COGNITION: 12/13/2022 Overall cognitive status: Within functional limits for tasks assessed  SENSATION: 12/13/2022 Light touch assessment equal bilateral for UE  POSTURE:  12/13/2022 Forward head posture, rounded shoulders, mild increase in thoracic kyphosis  PALPATION: 12/13/2022 No specific tenderness to light touch   CERVICAL ROM:   ROM AROM (deg) 12/13/2022  Flexion 70  Extension 58  Right lateral flexion   Left lateral flexion   Right rotation 75  Left rotation 68  12/03/2022:  No speciifc radicular symptoms worsened c movements in AROM cervical.    (Blank rows = not tested)  UPPER EXTREMITY ROM:  ROM Right 12/13/2022 Left 12/13/2022  Shoulder flexion [facility]  Shoulder extension    Shoulder abduction    Shoulder adduction    Shoulder extension    Shoulder internal rotation    Shoulder external rotation    Elbow flexion    Elbow extension    Wrist flexion    Wrist extension    Wrist ulnar deviation    Wrist radial deviation    Wrist pronation    Wrist supination     (Blank rows = not tested)  UPPER EXTREMITY MMT:  MMT Right 12/13/2022 Left 12/13/2022  Shoulder flexion 5/5 4/5  Shoulder extension    Shoulder abduction 5/5 4/5  Shoulder adduction    Shoulder extension    Shoulder internal rotation 5/5 5/5  Shoulder external rotation 5/5 4/5  Middle trapezius    Lower trapezius    Elbow flexion    Elbow extension    Wrist flexion    Wrist extension    Wrist ulnar deviation    Wrist radial deviation    Wrist pronation    Wrist supination    Grip strength     (Blank rows = not tested)  CERVICAL SPECIAL TESTS:  12/13/2022 No specific testing performed due to not having specific  pain in evaluation today.   FUNCTIONAL TESTS:  12/13/2022 No specific testing performed today.    TODAY'S TREATMENT:                                                                                             DATE: 12/13/2022  Therex:    HEP instruction/performance c cues for techniques, handout provided.  Trial set performed of each for comprehension and symptom assessment.  See below for exercise list  PATIENT EDUCATION:  Education details: HEP, POC Person educated: Patient Education method: Explanation, Demonstration, Verbal cues, and Handouts Education comprehension: verbalized understanding, returned demonstration, and verbal cues required  HOME EXERCISE PROGRAM: Access Code: [email or web address] URL: [email or web address] Date: 12/13/2022 Prepared by: Chyrel Masson  Exercises - Shoulder External Rotation and Scapular Retraction  - 3-5 x daily - 7 x weekly - 1 sets - 5 reps - 2 hold - Seated Assisted Cervical Rotation with Towel  - 2-3 x daily - 7 x weekly - 1 sets - 5-10 reps - 2-3 hold - Shoulder External Rotation with Anchored Resistance  - 2 x daily - 7 x weekly - 3 sets - 10 reps  ASSESSMENT:  CLINICAL IMPRESSION: Patient is a 72 y.o. who comes to clinic with complaints of cervical, Lt shoulder pain/tightness  with mobility, strength and movement coordination deficits that impair their ability to perform usual daily and recreational functional activities without increase difficulty/symptoms at this time.  Patient to benefit from skilled PT services to address impairments and limitations to improve to previous level of function without restriction secondary to condition.    OBJECTIVE IMPAIRMENTS: decreased activity tolerance, decreased coordination, decreased mobility, decreased ROM, decreased strength, impaired perceived functional ability, impaired flexibility, impaired UE functional use, improper body mechanics, postural dysfunction, and pain.   ACTIVITY LIMITATIONS:  carrying, lifting, and reach over head  PARTICIPATION LIMITATIONS: meal prep, cleaning, interpersonal relationship, community activity, and yard work  PERSONAL FACTORS:  CKD, CAD, GERD, HTN, hypothyroidism, lumbar spinal stenosis, length of time since onset  are also affecting patient's functional outcome.   REHAB POTENTIAL: Good  CLINICAL DECISION MAKING: Stable/uncomplicated  EVALUATION COMPLEXITY: Low   GOALS: Goals reviewed with patient? Yes  SHORT TERM GOALS: (target date for Short term goals are 3 weeks 01/03/2023)  1.Patient will demonstrate independent use of home exercise program to maintain progress from in clinic treatments. Goal status: New  LONG TERM GOALS: (target dates for all long term goals are 12 weeks  03/07/2023 )   1. Patient will demonstrate/report pain at worst less than or equal to 2/10 to facilitate minimal limitation in daily activity secondary to pain symptoms. Goal status: New   2. Patient will demonstrate independent use of home exercise program to facilitate ability to maintain/progress functional gains from skilled physical therapy services. Goal status: New   3. Patient will demonstrate FOTO outcome > or = 71 % to indicate reduced disability due to condition. Goal status: New   4.  Patient will demonstrate cervical AROM WFL s symptoms to facilitate usual head movements for daily activity including driving, self care.   Goal status: New   5.  Patient will demonstrate Lt shoulder MMT equal to Rt  s symptoms for improved lifting, reaching, carrying in daily life.   Goal status: New   6.  Patient will demonstrate/report ability to perform usual house/yard activity at PLOF s limitation.  Goal status: New    PLAN:  PT FREQUENCY: 1-2x/week  PT DURATION: 12 weeks  PLANNED INTERVENTIONS: Therapeutic exercises, Therapeutic activity, Neuro Muscular re-education, Balance training, Gait training, Patient/Family education, Joint mobilization, Stair  training, DME instructions, Dry Needling, Electrical stimulation, Cryotherapy, vasopneumatic device,Traction, Moist heat, Taping, Ultrasound, Ionotophoresis 4mg /ml Dexamethasone, and Manual therapy.  All included unless contraindicated  PLAN FOR NEXT SESSION: Check HEP use/response.  Check balance (Berg possible) per Pt report.    Scot Jun, PT, DPT, OCS, ATC 12/13/22  11:36 AM   Referring diagnosis? M54.12 (ICD-10-CM) - Radiculopathy, cervical region Treatment diagnosis? (if different than referring diagnosis) M54.2 What was this (referring dx) caused by? []  Surgery []  Fall [x]  Ongoing issue []  Arthritis []  Other: ____________  Laterality: []  Rt [x]  Lt []  Both  Check all possible CPT codes:  *CHOOSE 10 OR LESS*    []  97110 (Therapeutic Exercise)  []  92507 (SLP Treatment)  []  97112 (Neuro Re-ed)   []  92526 (Swallowing Treatment)   []  97116 (Gait Training)   []  [id number] (Cognitive Training, 1st 15 minutes) []  97140 (Manual Therapy)   []  97130 (Cognitive Training, each add'l 15 minutes)  []  97164 (Re-evaluation)                              []  Other, List CPT Code ____________  []  97530 (Therapeutic Activities)     []  97535 (Self Care)   [x]  All codes above (97110 - 97535)  [x]  97012 (Mechanical Traction)  []  97014 (E-stim Unattended)  [x]  97032 (E-stim manual)  []  97033 (Ionto)  []  97035 (Ultrasound) [x]  97750 (Physical Performance Training) []  [id number] (Aquatic Therapy) []  97016 (Vasopneumatic Device) []  [id number] (Paraffin) []  97034 (Contrast Bath) []  97597 (Wound Care 1st 20 sq cm) []  97598 (Wound Care each add'l 20 sq cm) []  97760 (Orthotic Fabrication, Fitting, Training Initial) []  [id number] (Prosthetic Management and Training Initial) []  [id number] (Orthotic or Prosthetic Training/ Modification Subsequent)

## 2022-12-17 ENCOUNTER — Encounter: Payer: Self-pay | Admitting: Physical Therapy

## 2022-12-17 ENCOUNTER — Ambulatory Visit: Payer: Medicare HMO | Admitting: Physical Therapy

## 2022-12-17 DIAGNOSIS — M25512 Pain in left shoulder: Secondary | ICD-10-CM | POA: Diagnosis not present

## 2022-12-17 DIAGNOSIS — M6281 Muscle weakness (generalized): Secondary | ICD-10-CM

## 2022-12-17 DIAGNOSIS — M542 Cervicalgia: Secondary | ICD-10-CM

## 2022-12-17 DIAGNOSIS — G8929 Other chronic pain: Secondary | ICD-10-CM

## 2022-12-17 DIAGNOSIS — R293 Abnormal posture: Secondary | ICD-10-CM

## 2022-12-17 NOTE — Therapy (Signed)
OUTPATIENT PHYSICAL THERAPY TREATMENT   Patient Name: Dustin [name] MRN: [id number] DOB:1951/10/04, 72 y.o., male Today's Date: 12/17/2022  END OF SESSION:  PT End of Session - 12/17/22 0907     Visit Number 2    Number of Visits 24    Date for PT Re-Evaluation 03/07/23    Authorization Type [employer] $25 copay    Authorization - Visit Number 2    Authorization - Number of Visits 12    Progress Note Due on Visit 10    PT Start Time 0925    PT Stop Time 1010    PT Time Calculation (min) 45 min    Activity Tolerance Patient tolerated treatment well    Behavior During Therapy [facility] for tasks assessed/performed             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   Hypertension    Hypothyroidism 07/01/2018   Paroxysmal A-fib ([place])    S/P mitral valve repair 07/08/2017   Spinal stenosis of lumbar region 08/22/2021   Past Surgical History:  Procedure Laterality Date   CARDIAC CATHETERIZATION  05/14/2017   hx ablation for A-flutter  11/01/2017   MITRAL VALVE REPAIR (MV)/CORONARY ARTERY BYPASS GRAFTING (CABG)  07/08/2017   SVG-OM, LAA closure, MV reconstruction with annuloplasty and sliding plasty via superior septal approach/Medtronic Simplici-T Annuloplasty System, left GSV harvest 07/08/17 by Jomarie Longs, MD at [place]     removed as a child   Patient Active Problem List   Diagnosis Date Noted   S/P lumbar fusion 12/01/2021   Lumbar stenosis 11/29/2021   S/P mitral valve repair 07/01/2018   OSA (obstructive sleep apnea) 07/01/2018   GERD (gastroesophageal reflux disease) 07/01/2018   Hyperlipidemia 07/01/2018   Erectile dysfunction 07/01/2018   Hypothyroid 07/01/2018    PCP: Dineen Kid MD  REFERRING PROVIDER: Callie Fielding, MD  REFERRING DIAG: M54.12 (ICD-10-CM) -  Radiculopathy, cervical region  THERAPY DIAG:  Cervicalgia  Chronic left shoulder pain  Muscle weakness (generalized)  Abnormal posture  Rationale for Evaluation and Treatment: Rehabilitation  ONSET DATE: approx. 06/2022  SUBJECTIVE:                                                                                                                                                                                                         SUBJECTIVE STATEMENT:  He states not much pain upon arrival, denies any N/T in his arm. He wants to review his HEP to make sure he is doing it right.  PERTINENT HISTORY:  CKD, CAD, GERD, HTN, hypothyroidism, lumbar spinal stenosis  PAIN:  NPRS scale: at worst 3-4/10 Pain location: Lt neck/shoulder Pain description: stiff, weak Aggravating factors: end ranges at times, carrying heavier weight Relieving factors: general neck mobility exercises  PRECAUTIONS: None  WEIGHT BEARING RESTRICTIONS: No  FALLS:  Has patient fallen in last 6 months? No   LIVING ENVIRONMENT: Lives in: House/apartment   OCCUPATION: Retired  PLOF: Independent, [employer] about 1x/week to 2x/week (has been limited), Rt handed.  Wife has chickens and he helps take care.   PATIENT GOALS:  Loosen up and get stronger, improve balance.     OBJECTIVE:   IMAGING REVIEW: 12/13/2022:  MRI IMPRESSION: 1. Advanced degenerative changes in the cervical spine as detailed above resulting in moderate to severe spinal canal stenosis at C3-C4 and severe spinal canal stenosis at C4-C5. No convincing cord signal abnormality. 2. Multilevel severe neural foraminal stenosis as detailed above. 3. Marrow edema in the posterior elements on the right at C3-C4 on the left at C4-C5 which may reflect a source of pain.  PATIENT SURVEYS:  12/13/2022 FOTO intake:  64  predicted:  71  COGNITION: 12/13/2022 Overall cognitive status: Within functional limits for tasks  assessed  SENSATION: 12/13/2022 Light touch assessment equal bilateral for UE  POSTURE:  12/13/2022 Forward head posture, rounded shoulders, mild increase in thoracic kyphosis  PALPATION: 12/13/2022 No specific tenderness to light touch   CERVICAL ROM:   ROM AROM (deg) 12/13/2022  Flexion 70  Extension 58  Right lateral flexion   Left lateral flexion   Right rotation 75  Left rotation 68  12/03/2022:  No speciifc radicular symptoms worsened c movements in AROM cervical.    (Blank rows = not tested)  UPPER EXTREMITY ROM:  ROM Right 12/13/2022 Left 12/13/2022  Shoulder flexion [facility]  Shoulder extension    Shoulder abduction    Shoulder adduction    Shoulder extension    Shoulder internal rotation    Shoulder external rotation    Elbow flexion    Elbow extension    Wrist flexion    Wrist extension    Wrist ulnar deviation    Wrist radial deviation    Wrist pronation    Wrist supination     (Blank rows = not tested)  UPPER EXTREMITY MMT:  MMT Right 12/13/2022 Left 12/13/2022  Shoulder flexion 5/5 4/5  Shoulder extension    Shoulder abduction 5/5 4/5  Shoulder adduction    Shoulder extension    Shoulder internal rotation 5/5 5/5  Shoulder external rotation 5/5 4/5  Middle trapezius    Lower trapezius    Elbow flexion    Elbow extension    Wrist flexion    Wrist extension    Wrist ulnar deviation    Wrist radial deviation    Wrist pronation    Wrist supination    Grip strength     (Blank rows = not tested)    [facility] PT Assessment - 12/17/22 0001       Standardized Balance Assessment   Standardized Balance Assessment Berg Balance Test;Dynamic Gait Index      Berg Balance Test   Sit to Stand Able to stand without using hands and stabilize independently    Standing Unsupported Able to stand safely 2 minutes    Sitting with  Back Unsupported but Feet Supported on Floor or Stool Able to sit safely and securely 2 minutes    Stand to Sit Sits safely with  minimal use of hands    Transfers Able to transfer safely, minor use of hands    Standing Unsupported with Eyes Closed Able to stand 10 seconds with supervision    Standing Unsupported with Feet Together Able to place feet together independently and stand 1 minute safely    From Standing, Reach Forward with Outstretched Arm Can reach confidently >25 cm (10")    From Standing Position, Pick up Object from Floor Able to pick up shoe safely and easily    From Standing Position, Turn to Look Behind Over each Shoulder Looks behind from both sides and weight shifts well    Turn 360 Degrees Able to turn 360 degrees safely in 4 seconds or less    Standing Unsupported, Alternately Place Feet on Step/Stool Able to stand independently and safely and complete 8 steps in 20 seconds    Standing Unsupported, One Foot in Front Able to plae foot ahead of the other independently and hold 30 seconds    Standing on One Leg Able to lift leg independently and hold 5-10 seconds    Total Score 53      Dynamic Gait Index   Level Surface Normal    Change in Gait Speed Normal    Gait with Horizontal Head Turns Mild Impairment    Gait with Vertical Head Turns Mild Impairment    Gait and Pivot Turn Normal    Step Over Obstacle Normal    Step Around Obstacles Normal    Steps Mild Impairment    Total Score 21             CERVICAL SPECIAL TESTS:  12/13/2022 No specific testing performed due to not having specific pain in evaluation today.   FUNCTIONAL TESTS:  12/13/2022 No specific testing performed today.    TODAY'S TREATMENT:                                                                                              DATE:  12/17/22 -UBE L5 UE/LE X 5 min -Standing scapular retraction with ER bilat, 5 sec hold X 10 -Seated cervical rotation AAROM with towel -Standing shoulder ER green 3X10 -Standing shoulder extensions green 2X10 -Standing shoulder flexion green 3X5 -Standing shoulder abduction 1#  3X5 -BERG balance and DGI testing, see above for details -HEP updates  12/13/2022  Therex:    HEP instruction/performance c cues for techniques, handout provided.  Trial set performed of each for comprehension and symptom assessment.  See below for exercise list  PATIENT EDUCATION:  Education details: HEP, POC Person educated: Patient Education method: Explanation, Demonstration, Verbal cues, and Handouts Education comprehension: verbalized understanding, returned demonstration, and verbal cues required  HOME EXERCISE PROGRAM: Access Code: [email or web address] URL: [email or web address] Date: 12/17/2022 Prepared by: [name]  Exercises - Shoulder External Rotation and Scapular Retraction  - 3-5 x daily - 7 x weekly - 1 sets - 10 reps - 5 hold - Seated Assisted Cervical Rotation with Towel  -  2-3 x daily - 7 x weekly - 1 sets - 5-10 reps - 5 hold - Shoulder External Rotation with Anchored Resistance  - 2 x daily - 7 x weekly - 3 sets - 10 reps - Shoulder extension with resistance - Neutral  - 2 x daily - 6 x weekly - 2-3 sets - 10 reps - Standing Shoulder Flexion to 90 Degrees with Dumbbells  - 2 x daily - 6 x weekly - 3 sets - 5 reps - Shoulder Abduction with Dumbbells - Thumbs Up  - 2 x daily - 6 x weekly - 3 sets - 5 reps - Single Leg Stance  - 2 x daily - 6 x weekly - 1 sets - 5 reps - 10 sec hold - Tandem Stance  - 2 x daily - 6 x weekly - 1 sets - 2-3 reps - 30 sec hold  ASSESSMENT:  CLINICAL IMPRESSION: We reviewed HEP and he shows good understanding. We tested his balance and he is not really at a risk of falls or very low risk observed with this. I did provide him with a couple balance exercises into his HEP along with some additional shoulder strengthening and he shows good understanding of these. He will need continued work on shoulder strengthening as tolerated.  OBJECTIVE IMPAIRMENTS: decreased activity tolerance, decreased coordination, decreased mobility,  decreased ROM, decreased strength, impaired perceived functional ability, impaired flexibility, impaired UE functional use, improper body mechanics, postural dysfunction, and pain.   ACTIVITY LIMITATIONS: carrying, lifting, and reach over head  PARTICIPATION LIMITATIONS: meal prep, cleaning, interpersonal relationship, community activity, and yard work  PERSONAL FACTORS:  CKD, CAD, GERD, HTN, hypothyroidism, lumbar spinal stenosis, length of time since onset  are also affecting patient's functional outcome.   REHAB POTENTIAL: Good  CLINICAL DECISION MAKING: Stable/uncomplicated  EVALUATION COMPLEXITY: Low   GOALS: Goals reviewed with patient? Yes  SHORT TERM GOALS: (target date for Short term goals are 3 weeks 01/03/2023)  1.Patient will demonstrate independent use of home exercise program to maintain progress from in clinic treatments. Goal status: New  LONG TERM GOALS: (target dates for all long term goals are 12 weeks  03/07/2023 )   1. Patient will demonstrate/report pain at worst less than or equal to 2/10 to facilitate minimal limitation in daily activity secondary to pain symptoms. Goal status: New   2. Patient will demonstrate independent use of home exercise program to facilitate ability to maintain/progress functional gains from skilled physical therapy services. Goal status: New   3. Patient will demonstrate FOTO outcome > or = 71 % to indicate reduced disability due to condition. Goal status: New   4.  Patient will demonstrate cervical AROM WFL s symptoms to facilitate usual head movements for daily activity including driving, self care.   Goal status: New   5.  Patient will demonstrate Lt shoulder MMT equal to Rt s symptoms for improved lifting, reaching, carrying in daily life.   Goal status: New   6.  Patient will demonstrate/report ability to perform usual house/yard activity at PLOF s limitation.  Goal status: New    PLAN:  PT FREQUENCY: 1-2x/week  PT  DURATION: 12 weeks  PLANNED INTERVENTIONS: Therapeutic exercises, Therapeutic activity, Neuro Muscular re-education, Balance training, Gait training, Patient/Family education, Joint mobilization, Stair training, DME instructions, Dry Needling, Electrical stimulation, Cryotherapy, vasopneumatic device,Traction, Moist heat, Taping, Ultrasound, Ionotophoresis 4mg /ml Dexamethasone, and Manual therapy.  All included unless contraindicated  PLAN FOR NEXT SESSION: shoulder strengthening focus.   ,  PT, DPT 12/17/22 10:25 AM    Referring diagnosis? M54.12 (ICD-10-CM) - Radiculopathy, cervical region Treatment diagnosis? (if different than referring diagnosis) M54.2 What was this (referring dx) caused by? []  Surgery []  Fall [x]  Ongoing issue []  Arthritis []  Other: ____________  Laterality: []  Rt [x]  Lt []  Both  Check all possible CPT codes:  *CHOOSE 10 OR LESS*    []  97110 (Therapeutic Exercise)  []  92507 (SLP Treatment)  []  97112 (Neuro Re-ed)   []  92526 (Swallowing Treatment)   []  97116 (Gait Training)   []  [id number] (Cognitive Training, 1st 15 minutes) []  97140 (Manual Therapy)   []  97130 (Cognitive Training, each add'l 15 minutes)  []  97164 (Re-evaluation)                              []  Other, List CPT Code ____________  []  97530 (Therapeutic Activities)     []  97535 (Self Care)   [x]  All codes above (97110 - 97535)  [x]  97012 (Mechanical Traction)  []  97014 (E-stim Unattended)  [x]  97032 (E-stim manual)  []  97033 (Ionto)  []  97035 (Ultrasound) [x]  97750 (Physical Performance Training) []  [id number] (Aquatic Therapy) []  97016 (Vasopneumatic Device) []  [id number] (Paraffin) []  97034 (Contrast Bath) []  97597 (Wound Care 1st 20 sq cm) []  97598 (Wound Care each add'l 20 sq cm) []  97760 (Orthotic Fabrication, Fitting, Training Initial) []  [id number] (Prosthetic Management and Training Initial) []  [id number] (Orthotic or Prosthetic Training/ Modification Subsequent)

## 2022-12-20 ENCOUNTER — Ambulatory Visit: Payer: Medicare HMO | Admitting: Rehabilitative and Restorative Service Providers"

## 2022-12-20 ENCOUNTER — Encounter: Payer: Self-pay | Admitting: Rehabilitative and Restorative Service Providers"

## 2022-12-20 DIAGNOSIS — M6281 Muscle weakness (generalized): Secondary | ICD-10-CM

## 2022-12-20 DIAGNOSIS — G8929 Other chronic pain: Secondary | ICD-10-CM

## 2022-12-20 DIAGNOSIS — R293 Abnormal posture: Secondary | ICD-10-CM

## 2022-12-20 DIAGNOSIS — M25512 Pain in left shoulder: Secondary | ICD-10-CM | POA: Diagnosis not present

## 2022-12-20 DIAGNOSIS — M542 Cervicalgia: Secondary | ICD-10-CM

## 2022-12-20 NOTE — Therapy (Signed)
OUTPATIENT PHYSICAL THERAPY TREATMENT   Patient Name: Dustin [name] MRN: [id number] DOB:1951-06-15, 72 y.o., male Today's Date: 12/20/2022  END OF SESSION:  PT End of Session - 12/20/22 1442     Visit Number 3    Number of Visits 24    Date for PT Re-Evaluation 03/07/23    Authorization Type [employer] $25 copay    Authorization - Visit Number 3    Authorization - Number of Visits 12    Progress Note Due on Visit 10    PT Start Time 1500    PT Stop Time 1540    PT Time Calculation (min) 40 min    Activity Tolerance Patient tolerated treatment well    Behavior During Therapy [facility] for tasks assessed/performed              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   Hypertension    Hypothyroidism 07/01/2018   Paroxysmal A-fib ([place])    S/P mitral valve repair 07/08/2017   Spinal stenosis of lumbar region 08/22/2021   Past Surgical History:  Procedure Laterality Date   CARDIAC CATHETERIZATION  05/14/2017   hx ablation for A-flutter  11/01/2017   MITRAL VALVE REPAIR (MV)/CORONARY ARTERY BYPASS GRAFTING (CABG)  07/08/2017   SVG-OM, LAA closure, MV reconstruction with annuloplasty and sliding plasty via superior septal approach/[employer]tronic Simplici-T Annuloplasty System, left GSV harvest 07/08/17 by Jomarie Longs, MD at [place]     removed as a child   Patient Active Problem List   Diagnosis Date Noted   S/P lumbar fusion 12/01/2021   Lumbar stenosis 11/29/2021   S/P mitral valve repair 07/01/2018   OSA (obstructive sleep apnea) 07/01/2018   GERD (gastroesophageal reflux disease) 07/01/2018   Hyperlipidemia 07/01/2018   Erectile dysfunction 07/01/2018   Hypothyroid 07/01/2018    PCP: Dineen Kid MD  REFERRING PROVIDER: Callie Fielding, MD  REFERRING DIAG: M54.12 (ICD-10-CM) -  Radiculopathy, cervical region  THERAPY DIAG:  Cervicalgia  Chronic left shoulder pain  Muscle weakness (generalized)  Abnormal posture  Rationale for Evaluation and Treatment: Rehabilitation  ONSET DATE: approx. 06/2022  SUBJECTIVE:                                                                                                                                                                                                         SUBJECTIVE  STATEMENT: He indicated stiffness complaints mostly in neck with some continued arm weakness.  Doing HEP at home without any real concerns.   PERTINENT HISTORY:  CKD, CAD, GERD, HTN, hypothyroidism, lumbar spinal stenosis  PAIN:  NPRS scale: at worst 3-4/10 Pain location: Lt neck/shoulder Pain description: stiff, weak Aggravating factors: end ranges at times, carrying heavier weight Relieving factors: general neck mobility exercises  PRECAUTIONS: None  WEIGHT BEARING RESTRICTIONS: No  FALLS:  Has patient fallen in last 6 months? No   LIVING ENVIRONMENT: Lives in: House/apartment   OCCUPATION: Retired  PLOF: Independent, [employer] about 1x/week to 2x/week (has been limited), Rt handed.  Wife has chickens and he helps take care.   PATIENT GOALS:  Loosen up and get stronger, improve balance.     OBJECTIVE:   IMAGING REVIEW: 12/13/2022:  MRI IMPRESSION: 1. Advanced degenerative changes in the cervical spine as detailed above resulting in moderate to severe spinal canal stenosis at C3-C4 and severe spinal canal stenosis at C4-C5. No convincing cord signal abnormality. 2. Multilevel severe neural foraminal stenosis as detailed above. 3. Marrow edema in the posterior elements on the right at C3-C4 on the left at C4-C5 which may reflect a source of pain.  PATIENT SURVEYS:  12/13/2022 FOTO intake:  64  predicted:  71  COGNITION: 12/13/2022 Overall cognitive status: Within functional limits for tasks  assessed  SENSATION: 12/13/2022 Light touch assessment equal bilateral for UE  POSTURE:  12/13/2022 Forward head posture, rounded shoulders, mild increase in thoracic kyphosis  PALPATION: 12/13/2022 No specific tenderness to light touch   CERVICAL ROM:   ROM AROM (deg) 12/13/2022 AROM 12/20/2022  Flexion 70 70  Extension 58 60  Right lateral flexion    Left lateral flexion    Right rotation 75 73  Left rotation 68 70  12/03/2022:  No speciifc radicular symptoms worsened c movements in AROM cervical.    (Blank rows = not tested)  UPPER EXTREMITY ROM:  ROM Right 12/13/2022 Left 12/13/2022  Shoulder flexion [facility]  Shoulder extension    Shoulder abduction    Shoulder adduction    Shoulder extension    Shoulder internal rotation    Shoulder external rotation    Elbow flexion    Elbow extension    Wrist flexion    Wrist extension    Wrist ulnar deviation    Wrist radial deviation    Wrist pronation    Wrist supination     (Blank rows = not tested)  UPPER EXTREMITY MMT:  MMT Right 12/13/2022 Left 12/13/2022  Shoulder flexion 5/5 4/5  Shoulder extension    Shoulder abduction 5/5 4/5  Shoulder adduction    Shoulder extension    Shoulder internal rotation 5/5 5/5  Shoulder external rotation 5/5 4/5  Middle trapezius    Lower trapezius    Elbow flexion    Elbow extension    Wrist flexion    Wrist extension    Wrist ulnar deviation    Wrist radial deviation    Wrist pronation    Wrist supination    Grip strength     (Blank rows = not tested)      CERVICAL SPECIAL TESTS:  12/13/2022 No specific testing performed due to not having specific pain in evaluation today.   FUNCTIONAL TESTS:  12/13/2022 No specific testing performed today.    TODAY'S TREATMENT:  DATE: 12/20/2022 Therex UBE UE/LE fwd 6 mins Lvl 3.5, reverse 3 mins lvl 3.5 4 mins Supine horizontal abduction green band  2 x 15 Supine cervical isometric retraction into pillow 5 sec hold x 10  Standing shoulder extensions green x 20 Standing green band rows green x 20   Manual Supine cervical g3 downslope mobs C5-C7 bilateral   TODAY'S TREATMENT:                                                                                 DATE: 12/17/2022 -UBE L5 UE/LE X 5 min -Standing scapular retraction with ER bilat, 5 sec hold X 10 -Seated cervical rotation AAROM with towel -Standing shoulder ER green 3X10 -Standing shoulder extensions green 2X10 -Standing shoulder flexion green 3X5 -Standing shoulder abduction 1# 3X5 -BERG balance and DGI testing, see above for details -HEP updates  TODAY'S TREATMENT:                                                                                 DATE: 12/13/2022  Therex:    HEP instruction/performance c cues for techniques, handout provided.  Trial set performed of each for comprehension and symptom assessment.  See below for exercise list  PATIENT EDUCATION:  Education details: HEP, POC Person educated: Patient Education method: Explanation, Demonstration, Verbal cues, and Handouts Education comprehension: verbalized understanding, returned demonstration, and verbal cues required  HOME EXERCISE PROGRAM: Access Code: [email or web address] URL: [email or web address] Date: 12/17/2022 Prepared by: [name]  Exercises - Shoulder External Rotation and Scapular Retraction  - 3-5 x daily - 7 x weekly - 1 sets - 10 reps - 5 hold - Seated Assisted Cervical Rotation with Towel  - 2-3 x daily - 7 x weekly - 1 sets - 5-10 reps - 5 hold - Shoulder External Rotation with Anchored Resistance  - 2 x daily - 7 x weekly - 3 sets - 10 reps - Shoulder extension with resistance - Neutral  - 2 x daily - 6 x weekly - 2-3 sets - 10 reps - Standing Shoulder Flexion to 90 Degrees with Dumbbells  - 2 x daily - 6 x weekly - 3 sets - 5 reps - Shoulder Abduction with Dumbbells - Thumbs Up  -  2 x daily - 6 x weekly - 3 sets - 5 reps - Single Leg Stance  - 2 x daily - 6 x weekly - 1 sets - 5 reps - 10 sec hold - Tandem Stance  - 2 x daily - 6 x weekly - 1 sets - 2-3 reps - 30 sec hold  ASSESSMENT:  CLINICAL IMPRESSION: Cervical mobility continued to show as limited.  Treated with medium grade mobilizations c no pain reported.  Continued strengthening program for UE to help improve use of arm in daily activity.  Continued skilled PT services warranted at this time.   OBJECTIVE IMPAIRMENTS: decreased  activity tolerance, decreased coordination, decreased mobility, decreased ROM, decreased strength, impaired perceived functional ability, impaired flexibility, impaired UE functional use, improper body mechanics, postural dysfunction, and pain.   ACTIVITY LIMITATIONS: carrying, lifting, and reach over head  PARTICIPATION LIMITATIONS: meal prep, cleaning, interpersonal relationship, community activity, and yard work  PERSONAL FACTORS:  CKD, CAD, GERD, HTN, hypothyroidism, lumbar spinal stenosis, length of time since onset  are also affecting patient's functional outcome.   REHAB POTENTIAL: Good  CLINICAL DECISION MAKING: Stable/uncomplicated  EVALUATION COMPLEXITY: Low   GOALS: Goals reviewed with patient? Yes  SHORT TERM GOALS: (target date for Short term goals are 3 weeks 01/03/2023)  1.Patient will demonstrate independent use of home exercise program to maintain progress from in clinic treatments. Goal status: on going 12/20/2022  LONG TERM GOALS: (target dates for all long term goals are 12 weeks  03/07/2023 )   1. Patient will demonstrate/report pain at worst less than or equal to 2/10 to facilitate minimal limitation in daily activity secondary to pain symptoms. Goal status: New   2. Patient will demonstrate independent use of home exercise program to facilitate ability to maintain/progress functional gains from skilled physical therapy services. Goal status: New   3.  Patient will demonstrate FOTO outcome > or = 71 % to indicate reduced disability due to condition. Goal status: New   4.  Patient will demonstrate cervical AROM WFL s symptoms to facilitate usual head movements for daily activity including driving, self care.   Goal status: New   5.  Patient will demonstrate Lt shoulder MMT equal to Rt s symptoms for improved lifting, reaching, carrying in daily life.   Goal status: New   6.  Patient will demonstrate/report ability to perform usual house/yard activity at PLOF s limitation.  Goal status: New    PLAN:  PT FREQUENCY: 1-2x/week  PT DURATION: 12 weeks  PLANNED INTERVENTIONS: Therapeutic exercises, Therapeutic activity, Neuro Muscular re-education, Balance training, Gait training, Patient/Family education, Joint mobilization, Stair training, DME instructions, Dry Needling, Electrical stimulation, Cryotherapy, vasopneumatic device,Traction, Moist heat, Taping, Ultrasound, Ionotophoresis 4mg /ml Dexamethasone, and Manual therapy.  All included unless contraindicated  PLAN FOR NEXT SESSION: shoulder strengthening focus and cervical mobility gains.   , PT, DPT, OCS, ATC 12/20/22  3:45 PM      Referring diagnosis? M54.12 (ICD-10-CM) - Radiculopathy, cervical region Treatment diagnosis? (if different than referring diagnosis) M54.2 What was this (referring dx) caused by? []  Surgery []  Fall [x]  Ongoing issue []  Arthritis []  Other: ____________  Laterality: []  Rt [x]  Lt []  Both  Check all possible CPT codes:  *CHOOSE 10 OR LESS*    []  97110 (Therapeutic Exercise)  []  02/18/23 (SLP Treatment)  []  97112 (Neuro Re-ed)   []  92526 (Swallowing Treatment)   []  97116 (Gait Training)   []  (Cognitive Training, 1st 15 minutes) []  97140 (Manual Therapy)   []  97130 (Cognitive Training, each add'l 15 minutes)  []  97164 (Re-evaluation)                              []  Other, List CPT Code ____________  []  97530 (Therapeutic  Activities)     []  97535 (Self Care)   [x]  All codes above (97110 - 97535)  [x]  97012 (Mechanical Traction)  []  97014 (E-stim Unattended)  [x]  97032 (E-stim manual)  []  97033 (Ionto)  []  97035 (Ultrasound) [x]  97750 (Physical Performance Training) []  (Aquatic Therapy) []  97016 (Vasopneumatic Device) []  (Paraffin) []   77116 (Contrast Bath) []  [phone] (Wound Care 1st 20 sq cm) []  97598 (Wound Care each add'l 20 sq cm) []  97760 (Orthotic Fabrication, Fitting, Training Initial) []  [id number] (Prosthetic Management and Training Initial) []  [id number] (Orthotic or Prosthetic Training/ Modification Subsequent)

## 2022-12-25 ENCOUNTER — Ambulatory Visit: Payer: Medicare HMO | Admitting: Rehabilitative and Restorative Service Providers"

## 2022-12-25 ENCOUNTER — Encounter: Payer: Self-pay | Admitting: Rehabilitative and Restorative Service Providers"

## 2022-12-25 DIAGNOSIS — M542 Cervicalgia: Secondary | ICD-10-CM | POA: Diagnosis not present

## 2022-12-25 DIAGNOSIS — M6281 Muscle weakness (generalized): Secondary | ICD-10-CM

## 2022-12-25 DIAGNOSIS — G8929 Other chronic pain: Secondary | ICD-10-CM

## 2022-12-25 DIAGNOSIS — R293 Abnormal posture: Secondary | ICD-10-CM | POA: Diagnosis not present

## 2022-12-25 DIAGNOSIS — M25512 Pain in left shoulder: Secondary | ICD-10-CM

## 2022-12-25 NOTE — Therapy (Signed)
OUTPATIENT PHYSICAL THERAPY TREATMENT   Patient Name: Dustin [name] MRN: [id number] DOB:August 01, 1951, 72 y.o., male Today's Date: 12/25/2022  END OF SESSION:  PT End of Session - 12/25/22 1408     Visit Number 4    Number of Visits 24    Date for PT Re-Evaluation 03/07/23    Authorization Type [employer] $25 copay    Authorization - Visit Number 4    Authorization - Number of Visits 12    Progress Note Due on Visit 10    PT Start Time 4196    PT Stop Time 1504    PT Time Calculation (min) 39 min    Activity Tolerance Patient tolerated treatment well    Behavior During Therapy WFL for tasks assessed/performed               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   Hypertension    Hypothyroidism 07/01/2018   Paroxysmal A-fib ([place])    S/P mitral valve repair 07/08/2017   Spinal stenosis of lumbar region 08/22/2021   Past Surgical History:  Procedure Laterality Date   CARDIAC CATHETERIZATION  05/14/2017   hx ablation for A-flutter  11/01/2017   MITRAL VALVE REPAIR (MV)/CORONARY ARTERY BYPASS GRAFTING (CABG)  07/08/2017   SVG-OM, LAA closure, MV reconstruction with annuloplasty and sliding plasty via superior septal approach/Medtronic Simplici-T Annuloplasty System, left GSV harvest 07/08/17 by Jomarie Longs, MD at [facility]     removed as a child   Patient Active Problem List   Diagnosis Date Noted   S/P lumbar fusion 12/01/2021   Lumbar stenosis 11/29/2021   S/P mitral valve repair 07/01/2018   OSA (obstructive sleep apnea) 07/01/2018   GERD (gastroesophageal reflux disease) 07/01/2018   Hyperlipidemia 07/01/2018   Erectile dysfunction 07/01/2018   Hypothyroid 07/01/2018    PCP: Dineen Kid MD  REFERRING PROVIDER: Callie Fielding, MD  REFERRING DIAG: M54.12 (ICD-10-CM) -  Radiculopathy, cervical region  THERAPY DIAG:  Cervicalgia  Chronic left shoulder pain  Muscle weakness (generalized)  Abnormal posture  Rationale for Evaluation and Treatment: Rehabilitation  ONSET DATE: approx. 06/2022  SUBJECTIVE:  SUBJECTIVE STATEMENT: He indicated feeling like last visit helped some motion.  He reported no pain upon arrival but did take tylenol for some complaints in middle back today.   PERTINENT HISTORY:  CKD, CAD, GERD, HTN, hypothyroidism, lumbar spinal stenosis  PAIN:  NPRS scale: 0/10 pain Pain location: Lt neck/shoulder Pain description: stiff, weak Aggravating factors: end ranges at times, carrying heavier weight Relieving factors: general neck mobility exercises  PRECAUTIONS: None  WEIGHT BEARING RESTRICTIONS: No  FALLS:  Has patient fallen in last 6 months? No   LIVING ENVIRONMENT: Lives in: House/apartment   OCCUPATION: Retired  PLOF: Independent, [employer] about 1x/week to 2x/week (has been limited), Rt handed.  Wife has chickens and he helps take care.   PATIENT GOALS:  Loosen up and get stronger, improve balance.     OBJECTIVE:   IMAGING REVIEW: 12/13/2022:  MRI IMPRESSION: 1. Advanced degenerative changes in the cervical spine as detailed above resulting in moderate to severe spinal canal stenosis at C3-C4 and severe spinal canal stenosis at C4-C5. No convincing cord signal abnormality. 2. Multilevel severe neural foraminal stenosis as detailed above. 3. Marrow edema in the posterior elements on the right at C3-C4 on the left at C4-C5 which may reflect a source of pain.  PATIENT SURVEYS:  12/13/2022 FOTO intake:  64  predicted:  71  COGNITION: 12/13/2022 Overall cognitive status: Within functional limits for tasks  assessed  SENSATION: 12/13/2022 Light touch assessment equal bilateral for UE  POSTURE:  12/13/2022 Forward head posture, rounded shoulders, mild increase in thoracic kyphosis  PALPATION: 12/13/2022 No specific tenderness to light touch   CERVICAL ROM:   ROM AROM (deg) 12/13/2022 AROM 12/20/2022  Flexion 70 70  Extension 58 60  Right lateral flexion    Left lateral flexion    Right rotation 75 73  Left rotation 68 70  12/03/2022:  No speciifc radicular symptoms worsened c movements in AROM cervical.    (Blank rows = not tested)  UPPER EXTREMITY ROM:  ROM Right 12/13/2022 Left 12/13/2022  Shoulder flexion [facility]  Shoulder extension    Shoulder abduction    Shoulder adduction    Shoulder extension    Shoulder internal rotation    Shoulder external rotation    Elbow flexion    Elbow extension    Wrist flexion    Wrist extension    Wrist ulnar deviation    Wrist radial deviation    Wrist pronation    Wrist supination     (Blank rows = not tested)  UPPER EXTREMITY MMT:  MMT Right 12/13/2022 Left 12/13/2022  Shoulder flexion 5/5 4/5  Shoulder extension    Shoulder abduction 5/5 4/5  Shoulder adduction    Shoulder extension    Shoulder internal rotation 5/5 5/5  Shoulder external rotation 5/5 4/5  Middle trapezius    Lower trapezius    Elbow flexion    Elbow extension    Wrist flexion    Wrist extension    Wrist ulnar deviation    Wrist radial deviation    Wrist pronation    Wrist supination    Grip strength     (Blank rows = not tested)   CERVICAL SPECIAL TESTS:  12/13/2022 No specific testing performed due to not having specific pain in evaluation today.   FUNCTIONAL TESTS:  12/13/2022 No specific testing performed today.    TODAY'S TREATMENT:  DATE: 12/25/2022 Therex UBE UE/LE fwd 6 mins Lvl 3.5, reverse 3 mins lvl 3.5 4 mins Supine cervical rotation Lt and Rt 2 x 10   Seated towel assisted rotation cervical 5 sec hold x 10 each way   Standing shoulder extensions green 2 x 15 Standing green band rows green 2 x 15  Manual Supine cervical g3 downslope mobs C4-C7 bilateral.  Gentle cervical distraction supine  TODAY'S TREATMENT:                                                                                 DATE: 12/20/2022 Therex UBE UE/LE fwd 6 mins Lvl 3.5, reverse 3 mins lvl 3.5 4 mins Supine horizontal abduction green band 2 x 15 Supine cervical isometric retraction into pillow 5 sec hold x 10  Standing shoulder extensions green x 20 Standing green band rows green x 20   Manual Supine cervical g3 downslope mobs C5-C7 bilateral   TODAY'S TREATMENT:                                                                                 DATE: 12/17/2022 -UBE L5 UE/LE X 5 min -Standing scapular retraction with ER bilat, 5 sec hold X 10 -Seated cervical rotation AAROM with towel -Standing shoulder ER green 3X10 -Standing shoulder extensions green 2X10 -Standing shoulder flexion green 3X5 -Standing shoulder abduction 1# 3X5 -BERG balance and DGI testing, see above for details -HEP updates   PATIENT EDUCATION:  Education details: HEP, POC Person educated: Patient Education method: Consulting civil engineer, Demonstration, Verbal cues, and Handouts Education comprehension: verbalized understanding, returned demonstration, and verbal cues required  HOME EXERCISE PROGRAM: Access Code: [email or web address] URL: [email or web address] Date: 12/17/2022 Prepared by: Elsie Ra  Exercises - Shoulder External Rotation and Scapular Retraction  - 3-5 x daily - 7 x weekly - 1 sets - 10 reps - 5 hold - Seated Assisted Cervical Rotation with Towel  - 2-3 x daily - 7 x weekly - 1 sets - 5-10 reps - 5 hold - Shoulder External Rotation with Anchored Resistance  - 2 x daily - 7 x weekly - 3 sets - 10 reps - Shoulder extension with resistance - Neutral  - 2 x daily - 6 x  weekly - 2-3 sets - 10 reps - Standing Shoulder Flexion to 90 Degrees with Dumbbells  - 2 x daily - 6 x weekly - 3 sets - 5 reps - Shoulder Abduction with Dumbbells - Thumbs Up  - 2 x daily - 6 x weekly - 3 sets - 5 reps - Single Leg Stance  - 2 x daily - 6 x weekly - 1 sets - 5 reps - 10 sec hold - Tandem Stance  - 2 x daily - 6 x weekly - 1 sets - 2-3 reps - 30 sec hold  ASSESSMENT:  CLINICAL IMPRESSION: Positive reporting from manual mobility improvements from  last visit.  Continued use today c progressive mobility improvements as focus for cervical range improvements.   OBJECTIVE IMPAIRMENTS: decreased activity tolerance, decreased coordination, decreased mobility, decreased ROM, decreased strength, impaired perceived functional ability, impaired flexibility, impaired UE functional use, improper body mechanics, postural dysfunction, and pain.   ACTIVITY LIMITATIONS: carrying, lifting, and reach over head  PARTICIPATION LIMITATIONS: meal prep, cleaning, interpersonal relationship, community activity, and yard work  PERSONAL FACTORS:  CKD, CAD, GERD, HTN, hypothyroidism, lumbar spinal stenosis, length of time since onset  are also affecting patient's functional outcome.   REHAB POTENTIAL: Good  CLINICAL DECISION MAKING: Stable/uncomplicated  EVALUATION COMPLEXITY: Low   GOALS: Goals reviewed with patient? Yes  SHORT TERM GOALS: (target date for Short term goals are 3 weeks 01/03/2023)  1.Patient will demonstrate independent use of home exercise program to maintain progress from in clinic treatments. Goal status: met  LONG TERM GOALS: (target dates for all long term goals are 12 weeks  03/07/2023 )   1. Patient will demonstrate/report pain at worst less than or equal to 2/10 to facilitate minimal limitation in daily activity secondary to pain symptoms. Goal status: on going 12/25/2022   2. Patient will demonstrate independent use of home exercise program to facilitate ability to  maintain/progress functional gains from skilled physical therapy services. Goal status: on going 12/25/2022   3. Patient will demonstrate FOTO outcome > or = 71 % to indicate reduced disability due to condition. Goal status: on going 12/25/2022   4.  Patient will demonstrate cervical AROM WFL s symptoms to facilitate usual head movements for daily activity including driving, self care.   Goal status: on going 12/25/2022   5.  Patient will demonstrate Lt shoulder MMT equal to Rt s symptoms for improved lifting, reaching, carrying in daily life.   Goal status: on going 12/25/2022   6.  Patient will demonstrate/report ability to perform usual house/yard activity at PLOF s limitation.  Goal status: on going 12/25/2022    PLAN:  PT FREQUENCY: 1-2x/week  PT DURATION: 12 weeks  PLANNED INTERVENTIONS: Therapeutic exercises, Therapeutic activity, Neuro Muscular re-education, Balance training, Gait training, Patient/Family education, Joint mobilization, Stair training, DME instructions, Dry Needling, Electrical stimulation, Cryotherapy, vasopneumatic device,Traction, Moist heat, Taping, Ultrasound, Ionotophoresis 4mg /ml Dexamethasone, and Manual therapy.  All included unless contraindicated  PLAN FOR NEXT SESSION: shoulder strengthening focus and cervical mobility through manual and therex.   , PT, DPT, OCS, ATC 12/25/22  3:03 PM      Referring diagnosis? M54.12 (ICD-10-CM) - Radiculopathy, cervical region Treatment diagnosis? (if different than referring diagnosis) M54.2 What was this (referring dx) caused by? []  Surgery []  Fall [x]  Ongoing issue []  Arthritis []  Other: ____________  Laterality: []  Rt [x]  Lt []  Both  Check all possible CPT codes:  *CHOOSE 10 OR LESS*    []  97110 (Therapeutic Exercise)  []  92507 (SLP Treatment)  []  97112 (Neuro Re-ed)   []  92526 (Swallowing Treatment)   []  97116 (Gait Training)   []  02/23/23 (Cognitive Training, 1st 15 minutes) []  97140  (Manual Therapy)   []  97130 (Cognitive Training, each add'l 15 minutes)  []  97164 (Re-evaluation)                              []  Other, List CPT Code ____________  []  (Therapeutic Activities)     []  97535 (Self Care)   [x]  All codes above (97110 - 97535)  [x]  97012 (Mechanical Traction)  []   97014 (E-stim Unattended)  [x]  97032 (E-stim manual)  []  97033 (Ionto)  []  97035 (Ultrasound) [x]  97750 (Physical Performance Training) []  (Aquatic Therapy) []  97016 (Vasopneumatic Device) []  (Paraffin) []  97034 (Contrast Bath) []  97597 (Wound Care 1st 20 sq cm) []  97598 (Wound Care each add'l 20 sq cm) []  97760 (Orthotic Fabrication, Fitting, Training Initial) []  (Prosthetic Management and Training Initial) []  (Orthotic or Prosthetic Training/ Modification Subsequent)

## 2022-12-27 ENCOUNTER — Ambulatory Visit: Payer: Medicare HMO | Admitting: Rehabilitative and Restorative Service Providers"

## 2022-12-27 ENCOUNTER — Encounter: Payer: Self-pay | Admitting: Rehabilitative and Restorative Service Providers"

## 2022-12-27 DIAGNOSIS — M6281 Muscle weakness (generalized): Secondary | ICD-10-CM | POA: Diagnosis not present

## 2022-12-27 DIAGNOSIS — M542 Cervicalgia: Secondary | ICD-10-CM

## 2022-12-27 DIAGNOSIS — M25512 Pain in left shoulder: Secondary | ICD-10-CM | POA: Diagnosis not present

## 2022-12-27 DIAGNOSIS — R293 Abnormal posture: Secondary | ICD-10-CM | POA: Diagnosis not present

## 2022-12-27 DIAGNOSIS — G8929 Other chronic pain: Secondary | ICD-10-CM

## 2022-12-27 NOTE — Therapy (Signed)
OUTPATIENT PHYSICAL THERAPY TREATMENT   Patient Name: Dustin [name] MRN: [id number] DOB:01-10-1951, 72 y.o., male Today's Date: 12/27/2022  END OF SESSION:  PT End of Session - 12/27/22 1510     Visit Number 5    Number of Visits 24    Date for PT Re-Evaluation 03/07/23    Authorization Type [employer] $25 copay    Authorization - Visit Number 5    Authorization - Number of Visits 12    Progress Note Due on Visit 10    PT Start Time 1505    PT Stop Time 1544    PT Time Calculation (min) 39 min    Activity Tolerance Patient tolerated treatment well    Behavior During Therapy WFL for tasks assessed/performed                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   Hypertension    Hypothyroidism 07/01/2018   Paroxysmal A-fib (HCC)    S/P mitral valve repair 07/08/2017   Spinal stenosis of lumbar region 08/22/2021   Past Surgical History:  Procedure Laterality Date   CARDIAC CATHETERIZATION  05/14/2017   hx ablation for A-flutter  11/01/2017   MITRAL VALVE REPAIR (MV)/CORONARY ARTERY BYPASS GRAFTING (CABG)  07/08/2017   SVG-OM, LAA closure, MV reconstruction with annuloplasty and sliding plasty via superior septal approach/Medtronic Simplici-T Annuloplasty System, left GSV harvest 07/08/17 by Duanne Limerick, MD at [facility]   TONSILLECTOMY     removed as a child   Patient Active Problem List   Diagnosis Date Noted   S/P lumbar fusion 12/01/2021   Lumbar stenosis 11/29/2021   S/P mitral valve repair 07/01/2018   OSA (obstructive sleep apnea) 07/01/2018   GERD (gastroesophageal reflux disease) 07/01/2018   Hyperlipidemia 07/01/2018   Erectile dysfunction 07/01/2018   Hypothyroid 07/01/2018    PCP: Selina Cooley MD  REFERRING PROVIDER: London Sheer, MD  REFERRING DIAG: M54.12 (ICD-10-CM) -  Radiculopathy, cervical region  THERAPY DIAG:  Cervicalgia  Chronic left shoulder pain  Muscle weakness (generalized)  Abnormal posture  Rationale for Evaluation and Treatment: Rehabilitation  ONSET DATE: approx. 06/2022  SUBJECTIVE:  SUBJECTIVE STATEMENT: He indicated no pain complaints upon arrival.  Felt a little stiff here or there but overall doing better.  Reported being busy today so he hadn't done any specific HEP.   PERTINENT HISTORY:  CKD, CAD, GERD, HTN, hypothyroidism, lumbar spinal stenosis  PAIN:  NPRS scale: 0/10 pain Pain location: Lt neck/shoulder Pain description: stiff, weak Aggravating factors: end ranges at times, carrying heavier weight Relieving factors: general neck mobility exercises  PRECAUTIONS: None  WEIGHT BEARING RESTRICTIONS: No  FALLS:  Has patient fallen in last 6 months? No   LIVING ENVIRONMENT: Lives in: House/apartment   OCCUPATION: Retired  PLOF: Independent, [employer] about 1x/week to 2x/week (has been limited), Rt handed.  Wife has chickens and he helps take care.   PATIENT GOALS:  Loosen up and get stronger, improve balance.     OBJECTIVE:   IMAGING REVIEW: 12/13/2022:  MRI IMPRESSION: 1. Advanced degenerative changes in the cervical spine as detailed above resulting in moderate to severe spinal canal stenosis at C3-C4 and severe spinal canal stenosis at C4-C5. No convincing cord signal abnormality. 2. Multilevel severe neural foraminal stenosis as detailed above. 3. Marrow edema in the posterior elements on the right at C3-C4 on the left at C4-C5 which may reflect a source of pain.  PATIENT SURVEYS:  12/13/2022 FOTO intake:  64  predicted:  71  COGNITION: 12/13/2022 Overall cognitive status: Within functional limits for  tasks assessed  SENSATION: 12/13/2022 Light touch assessment equal bilateral for UE  POSTURE:  12/13/2022 Forward head posture, rounded shoulders, mild increase in thoracic kyphosis  PALPATION: 12/13/2022 No specific tenderness to light touch   CERVICAL ROM:   ROM AROM (deg) 12/13/2022 AROM 12/20/2022  Flexion 70 70  Extension 58 60  Right lateral flexion    Left lateral flexion    Right rotation 75 73  Left rotation 68 70  12/03/2022:  No speciifc radicular symptoms worsened c movements in AROM cervical.    (Blank rows = not tested)  UPPER EXTREMITY ROM:  ROM Right 12/13/2022 Left 12/13/2022  Shoulder flexion [facility]  Shoulder extension    Shoulder abduction    Shoulder adduction    Shoulder extension    Shoulder internal rotation    Shoulder external rotation    Elbow flexion    Elbow extension    Wrist flexion    Wrist extension    Wrist ulnar deviation    Wrist radial deviation    Wrist pronation    Wrist supination     (Blank rows = not tested)  UPPER EXTREMITY MMT:  MMT Right 12/13/2022 Left 12/13/2022 Left 12/27/2022  Shoulder flexion 5/5 4/5 4+/5  Shoulder extension     Shoulder abduction 5/5 4/5 4/5  Shoulder adduction     Shoulder extension     Shoulder internal rotation 5/5 5/5 5/5  Shoulder external rotation 5/5 4/5 4+/5  Middle trapezius     Lower trapezius     Elbow flexion     Elbow extension     Wrist flexion     Wrist extension     Wrist ulnar deviation     Wrist radial deviation     Wrist pronation     Wrist supination     Grip strength      (Blank rows = not tested)   CERVICAL SPECIAL TESTS:  12/13/2022 No specific testing performed due to not having specific pain in evaluation today.   FUNCTIONAL TESTS:  12/13/2022 No specific testing performed today.  TODAY'S TREATMENT:                                                                                 DATE: 12/27/2022 Therex UBE UE/LE 4 mins fwd/back each way lvl 3.0 Standing  blue band rows 2 x 15 Standing blue band GH ext 2 x 15 Standing green band ER c towel under arm 2 x 15 bilateral Shoulder flexion with elbow flexed to 90 deg 2 lbs 2 x 15 Lt Chest press machine Lt arm only c SA plus press x 10 bilateral 5 lbs    TODAY'S TREATMENT:                                                                                 DATE: 12/25/2022 Therex UBE UE/LE fwd 6 mins Lvl 3.5, reverse 3 mins lvl 3.5 4 mins Supine cervical rotation Lt and Rt 2 x 10  Seated towel assisted rotation cervical 5 sec hold x 10 each way   Standing shoulder extensions green 2 x 15 Standing green band rows green 2 x 15  Manual Supine cervical g3 downslope mobs C4-C7 bilateral.  Gentle cervical distraction supine  TODAY'S TREATMENT:                                                                                 DATE: 12/20/2022 Therex UBE UE/LE fwd 6 mins Lvl 3.5, reverse 3 mins lvl 3.5 4 mins Supine horizontal abduction green band 2 x 15 Supine cervical isometric retraction into pillow 5 sec hold x 10  Standing shoulder extensions green x 20 Standing green band rows green x 20   Manual Supine cervical g3 downslope mobs C5-C7 bilateral   TODAY'S TREATMENT:                                                                                 DATE: 12/17/2022 -UBE L5 UE/LE X 5 min -Standing scapular retraction with ER bilat, 5 sec hold X 10 -Seated cervical rotation AAROM with towel -Standing shoulder ER green 3X10 -Standing shoulder extensions green 2X10 -Standing shoulder flexion green 3X5 -Standing shoulder abduction 1# 3X5 -BERG balance and DGI testing, see above for details -HEP updates   PATIENT EDUCATION:  Education details: HEP, POC Person educated:  Patient Education method: Explanation, Demonstration, Verbal cues, and Handouts Education comprehension: verbalized understanding, returned demonstration, and verbal cues required  HOME EXERCISE PROGRAM: Access Code: [email or web address] URL:  [email or web address] Date: 12/17/2022 Prepared by: Elsie Ra  Exercises - Shoulder External Rotation and Scapular Retraction  - 3-5 x daily - 7 x weekly - 1 sets - 10 reps - 5 hold - Seated Assisted Cervical Rotation with Towel  - 2-3 x daily - 7 x weekly - 1 sets - 5-10 reps - 5 hold - Shoulder External Rotation with Anchored Resistance  - 2 x daily - 7 x weekly - 3 sets - 10 reps - Shoulder extension with resistance - Neutral  - 2 x daily - 6 x weekly - 2-3 sets - 10 reps - Standing Shoulder Flexion to 90 Degrees with Dumbbells  - 2 x daily - 6 x weekly - 3 sets - 5 reps - Shoulder Abduction with Dumbbells - Thumbs Up  - 2 x daily - 6 x weekly - 3 sets - 5 reps - Single Leg Stance  - 2 x daily - 6 x weekly - 1 sets - 5 reps - 10 sec hold - Tandem Stance  - 2 x daily - 6 x weekly - 1 sets - 2-3 reps - 30 sec hold  ASSESSMENT:  CLINICAL IMPRESSION: Focused primarily on strengthening program today due to overall improvements in symptoms/mobility.  Lt arm has shown improvements but still lacking compared to Rt arm.   OBJECTIVE IMPAIRMENTS: decreased activity tolerance, decreased coordination, decreased mobility, decreased ROM, decreased strength, impaired perceived functional ability, impaired flexibility, impaired UE functional use, improper body mechanics, postural dysfunction, and pain.   ACTIVITY LIMITATIONS: carrying, lifting, and reach over head  PARTICIPATION LIMITATIONS: meal prep, cleaning, interpersonal relationship, community activity, and yard work  PERSONAL FACTORS:  CKD, CAD, GERD, HTN, hypothyroidism, lumbar spinal stenosis, length of time since onset  are also affecting patient's functional outcome.   REHAB POTENTIAL: Good  CLINICAL DECISION MAKING: Stable/uncomplicated  EVALUATION COMPLEXITY: Low   GOALS: Goals reviewed with patient? Yes  SHORT TERM GOALS: (target date for Short term goals are 3 weeks 01/03/2023)  1.Patient will demonstrate  independent use of home exercise program to maintain progress from in clinic treatments. Goal status: met  LONG TERM GOALS: (target dates for all long term goals are 12 weeks  03/07/2023 )   1. Patient will demonstrate/report pain at worst less than or equal to 2/10 to facilitate minimal limitation in daily activity secondary to pain symptoms. Goal status: on going 12/25/2022   2. Patient will demonstrate independent use of home exercise program to facilitate ability to maintain/progress functional gains from skilled physical therapy services. Goal status: on going 12/25/2022   3. Patient will demonstrate FOTO outcome > or = 71 % to indicate reduced disability due to condition. Goal status: on going 12/25/2022   4.  Patient will demonstrate cervical AROM WFL s symptoms to facilitate usual head movements for daily activity including driving, self care.   Goal status: on going 12/25/2022   5.  Patient will demonstrate Lt shoulder MMT equal to Rt s symptoms for improved lifting, reaching, carrying in daily life.   Goal status: on going 12/25/2022   6.  Patient will demonstrate/report ability to perform usual house/yard activity at PLOF s limitation.  Goal status: on going 12/25/2022    PLAN:  PT FREQUENCY: 1-2x/week  PT DURATION: 12 weeks  PLANNED INTERVENTIONS: Therapeutic exercises, Therapeutic activity, Neuro Muscular re-education, Balance training,  Gait training, Patient/Family education, Joint mobilization, Stair training, DME instructions, Dry Needling, Electrical stimulation, Cryotherapy, vasopneumatic device,Traction, Moist heat, Taping, Ultrasound, Ionotophoresis 4mg /ml Dexamethasone, and Manual therapy.  All included unless contraindicated  PLAN FOR NEXT SESSION: shoulder strengthening focus /FOTO reassessment soon due to progress?  , PT, DPT, OCS, ATC 12/27/22  3:43 PM      Referring diagnosis? M54.12 (ICD-10-CM) - Radiculopathy, cervical region Treatment  diagnosis? (if different than referring diagnosis) M54.2 What was this (referring dx) caused by? []  Surgery []  Fall [x]  Ongoing issue []  Arthritis []  Other: ____________  Laterality: []  Rt [x]  Lt []  Both  Check all possible CPT codes:  *CHOOSE 10 OR LESS*    []  97110 (Therapeutic Exercise)  []  92507 (SLP Treatment)  []  97112 (Neuro Re-ed)   []  02/25/23 (Swallowing Treatment)   []  97116 (Gait Training)   []  (Cognitive Training, 1st 15 minutes) []  97140 (Manual Therapy)   []  97130 (Cognitive Training, each add'l 15 minutes)  []  97164 (Re-evaluation)                              []  Other, List CPT Code ____________  []  97530 (Therapeutic Activities)     []  97535 (Self Care)   [x]  All codes above (97110 - 97535)  [x]  97012 (Mechanical Traction)  []  97014 (E-stim Unattended)  [x]  97032 (E-stim manual)  []  97033 (Ionto)  []  97035 (Ultrasound) [x]  97750 (Physical Performance Training) []  (Aquatic Therapy) []  97016 (Vasopneumatic Device) []  (Paraffin) []  97034 (Contrast Bath) []  97597 (Wound Care 1st 20 sq cm) []  97598 (Wound Care each add'l 20 sq cm) []  97760 (Orthotic Fabrication, Fitting, Training Initial) []  (Prosthetic Management and Training Initial) []  (Orthotic or Prosthetic Training/ Modification Subsequent)

## 2023-01-01 ENCOUNTER — Encounter: Payer: Self-pay | Admitting: Rehabilitative and Restorative Service Providers"

## 2023-01-01 ENCOUNTER — Ambulatory Visit: Payer: Medicare HMO | Admitting: Rehabilitative and Restorative Service Providers"

## 2023-01-01 DIAGNOSIS — M542 Cervicalgia: Secondary | ICD-10-CM | POA: Diagnosis not present

## 2023-01-01 DIAGNOSIS — M6281 Muscle weakness (generalized): Secondary | ICD-10-CM

## 2023-01-01 DIAGNOSIS — R293 Abnormal posture: Secondary | ICD-10-CM

## 2023-01-01 DIAGNOSIS — M25512 Pain in left shoulder: Secondary | ICD-10-CM | POA: Diagnosis not present

## 2023-01-01 DIAGNOSIS — G8929 Other chronic pain: Secondary | ICD-10-CM

## 2023-01-01 NOTE — Therapy (Signed)
OUTPATIENT PHYSICAL THERAPY TREATMENT   Patient Name: Dustin [name] MRN: [id number] DOB:Jul 12, 1951, 72 y.o., male Today's Date: 01/01/2023  END OF SESSION:  PT End of Session - 01/01/23 1451     Visit Number 6    Number of Visits 24    Date for PT Re-Evaluation 03/07/23    Authorization Type [employer] $25 copay    Authorization - Visit Number 6    Authorization - Number of Visits 12    Progress Note Due on Visit 10    PT Start Time [id number]    PT Stop Time 1536    PT Time Calculation (min) 39 min    Activity Tolerance Patient tolerated treatment well    Behavior During Therapy WFL for tasks assessed/performed             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   Hypertension    Hypothyroidism 07/01/2018   Paroxysmal A-fib (HCC)    S/P mitral valve repair 07/08/2017   Spinal stenosis of lumbar region 08/22/2021   Past Surgical History:  Procedure Laterality Date   CARDIAC CATHETERIZATION  05/14/2017   hx ablation for A-flutter  11/01/2017   MITRAL VALVE REPAIR (MV)/CORONARY ARTERY BYPASS GRAFTING (CABG)  07/08/2017   SVG-OM, LAA closure, MV reconstruction with annuloplasty and sliding plasty via superior septal approach/Medtronic Simplici-T Annuloplasty System, left GSV harvest 07/08/17 by Jomarie Longs, MD at [place]     removed as a child   Patient Active Problem List   Diagnosis Date Noted   S/P lumbar fusion 12/01/2021   Lumbar stenosis 11/29/2021   S/P mitral valve repair 07/01/2018   OSA (obstructive sleep apnea) 07/01/2018   GERD (gastroesophageal reflux disease) 07/01/2018   Hyperlipidemia 07/01/2018   Erectile dysfunction 07/01/2018   Hypothyroid 07/01/2018    PCP: Dineen Kid MD  REFERRING PROVIDER: Callie Fielding, MD  REFERRING DIAG: M54.12 (ICD-10-CM) -  Radiculopathy, cervical region  THERAPY DIAG:  Cervicalgia  Chronic left shoulder pain  Muscle weakness (generalized)  Abnormal posture  Rationale for Evaluation and Treatment: Rehabilitation  ONSET DATE: approx. 06/2022  SUBJECTIVE:                                                                                                                                                                                                         SUBJECTIVE STATEMENT:  He indicated feeling similar tightness that improves with motion activity.  No real pain complaints upon arrival today.  He mentioned he didn't get any strengthening.   PERTINENT HISTORY:  CKD, CAD, GERD, HTN, hypothyroidism, lumbar spinal stenosis  PAIN:  NPRS scale: 0/10 pain Pain location: Lt neck/shoulder Pain description: stiff, weak Aggravating factors: end ranges at times, carrying heavier weight Relieving factors: general neck mobility exercises  PRECAUTIONS: None  WEIGHT BEARING RESTRICTIONS: No  FALLS:  Has patient fallen in last 6 months? No  LIVING ENVIRONMENT: Lives in: House/apartment  OCCUPATION: Retired  PLOF: Independent, [employer] about 1x/week to 2x/week (has been limited), Rt handed.  Wife has chickens and he helps take care.   PATIENT GOALS:  Loosen up and get stronger, improve balance.     OBJECTIVE:   IMAGING REVIEW: 12/13/2022:  MRI IMPRESSION: 1. Advanced degenerative changes in the cervical spine as detailed above resulting in moderate to severe spinal canal stenosis at C3-C4 and severe spinal canal stenosis at C4-C5. No convincing cord signal abnormality. 2. Multilevel severe neural foraminal stenosis as detailed above. 3. Marrow edema in the posterior elements on the right at C3-C4 on the left at C4-C5 which may reflect a source of pain.  PATIENT SURVEYS:  01/01/2023  FOTO update:  61  12/13/2022 FOTO intake:  64  predicted:  71  COGNITION: 12/13/2022 Overall cognitive status:  Within functional limits for tasks assessed  SENSATION: 12/13/2022 Light touch assessment equal bilateral for UE  POSTURE:  12/13/2022 Forward head posture, rounded shoulders, mild increase in thoracic kyphosis  PALPATION: 12/13/2022 No specific tenderness to light touch   CERVICAL ROM:   ROM AROM (deg) 12/13/2022 AROM 12/20/2022  Flexion 70 70  Extension 58 60  Right lateral flexion    Left lateral flexion    Right rotation 75 73  Left rotation 68 70  12/03/2022:  No speciifc radicular symptoms worsened c movements in AROM cervical.    (Blank rows = not tested)  UPPER EXTREMITY ROM:  ROM Right 12/13/2022 Left 12/13/2022  Shoulder flexion [facility]  Shoulder extension    Shoulder abduction    Shoulder adduction    Shoulder extension    Shoulder internal rotation    Shoulder external rotation    Elbow flexion    Elbow extension    Wrist flexion    Wrist extension    Wrist ulnar deviation    Wrist radial deviation    Wrist pronation    Wrist supination     (Blank rows = not tested)  UPPER EXTREMITY MMT:  MMT Right 12/13/2022 Left 12/13/2022 Left 12/27/2022  Shoulder flexion 5/5 4/5 4+/5  Shoulder extension     Shoulder abduction 5/5 4/5 4/5  Shoulder adduction     Shoulder extension     Shoulder internal rotation 5/5 5/5 5/5  Shoulder external rotation 5/5 4/5 4+/5  Middle trapezius     Lower trapezius     Elbow flexion     Elbow extension     Wrist flexion     Wrist extension     Wrist ulnar deviation     Wrist radial deviation     Wrist pronation     Wrist supination     Grip strength      (Blank rows = not tested)   CERVICAL SPECIAL TESTS:  12/13/2022 No specific testing performed due to not having specific pain in evaluation today.   FUNCTIONAL TESTS:  12/13/2022 No specific testing performed today.  TODAY'S TREATMENT:                                                                                 DATE: 01/01/2023 Therex Nustep Lvl 5 UE/LE 10  mins  Standing green band ER c towel under arm 2 x 15 c fatigue hold on last rep Standing green band IR c towel under arm 2 x 15 Lt arm Chest press machine Lt arm only c SA plus press  x 15 bilateral 5 lbs Machine rows Lt arm only 15 lbs 2 x 15, performed bilaterally Standing bilateral shoulder flexion 90 deg to abduction to side and reverse 1 lb 2 x 10  Wall push up c serratus plus press 2 x 10    TODAY'S TREATMENT:                                                                                 DATE: 12/27/2022 Therex UBE UE/LE 4 mins fwd/back each way lvl 3.0 Standing blue band rows 2 x 15 Standing blue band GH ext 2 x 15 Standing green band ER c towel under arm 2 x 15 bilateral Shoulder flexion with elbow flexed to 90 deg 2 lbs 2 x 15 Lt Chest press machine Lt arm only c SA plus press x 10 bilateral 5 lbs    TODAY'S TREATMENT:                                                                                 DATE: 12/25/2022 Therex UBE UE/LE fwd 6 mins Lvl 3.5, reverse 3 mins lvl 3.5 4 mins Supine cervical rotation Lt and Rt 2 x 10  Seated towel assisted rotation cervical 5 sec hold x 10 each way   Standing shoulder extensions green 2 x 15 Standing green band rows green 2 x 15  Manual Supine cervical g3 downslope mobs C4-C7 bilateral.  Gentle cervical distraction supine   PATIENT EDUCATION:  Education details: HEP, POC Person educated: Patient Education method: Consulting civil engineer, Media planner, Verbal cues, and Handouts Education comprehension: verbalized understanding, returned demonstration, and verbal cues required  HOME EXERCISE PROGRAM: Access Code: [email or web address] URL: [email or web address] Date: 12/17/2022 Prepared by: Elsie Ra  Exercises - Shoulder External Rotation and Scapular Retraction  - 3-5 x daily - 7 x weekly - 1 sets - 10 reps - 5 hold - Seated Assisted Cervical Rotation with Towel  - 2-3 x daily - 7 x weekly - 1 sets - 5-10 reps - 5 hold - Shoulder  External Rotation with Anchored Resistance  - 2 x daily - 7 x  weekly - 3 sets - 10 reps - Shoulder extension with resistance - Neutral  - 2 x daily - 6 x weekly - 2-3 sets - 10 reps - Standing Shoulder Flexion to 90 Degrees with Dumbbells  - 2 x daily - 6 x weekly - 3 sets - 5 reps - Shoulder Abduction with Dumbbells - Thumbs Up  - 2 x daily - 6 x weekly - 3 sets - 5 reps - Single Leg Stance  - 2 x daily - 6 x weekly - 1 sets - 5 reps - 10 sec hold - Tandem Stance  - 2 x daily - 6 x weekly - 1 sets - 2-3 reps - 30 sec hold  ASSESSMENT:  CLINICAL IMPRESSION: Focused primarily on strengthening program today due to overall improvements in symptoms/mobility.  Lt arm has shown improvements but still lacking compared to Rt arm.   OBJECTIVE IMPAIRMENTS: decreased activity tolerance, decreased coordination, decreased mobility, decreased ROM, decreased strength, impaired perceived functional ability, impaired flexibility, impaired UE functional use, improper body mechanics, postural dysfunction, and pain.   ACTIVITY LIMITATIONS: carrying, lifting, and reach over head  PARTICIPATION LIMITATIONS: meal prep, cleaning, interpersonal relationship, community activity, and yard work  PERSONAL FACTORS:  CKD, CAD, GERD, HTN, hypothyroidism, lumbar spinal stenosis, length of time since onset  are also affecting patient's functional outcome.   REHAB POTENTIAL: Good  CLINICAL DECISION MAKING: Stable/uncomplicated  EVALUATION COMPLEXITY: Low   GOALS: Goals reviewed with patient? Yes  SHORT TERM GOALS: (target date for Short term goals are 3 weeks 01/03/2023)  1.Patient will demonstrate independent use of home exercise program to maintain progress from in clinic treatments. Goal status: met  LONG TERM GOALS: (target dates for all long term goals are 12 weeks  03/07/2023 )   1. Patient will demonstrate/report pain at worst less than or equal to 2/10 to facilitate minimal limitation in daily activity  secondary to pain symptoms. Goal status: on going 12/25/2022   2. Patient will demonstrate independent use of home exercise program to facilitate ability to maintain/progress functional gains from skilled physical therapy services. Goal status: on going 12/25/2022   3. Patient will demonstrate FOTO outcome > or = 71 % to indicate reduced disability due to condition. Goal status: on going 12/25/2022   4.  Patient will demonstrate cervical AROM WFL s symptoms to facilitate usual head movements for daily activity including driving, self care.   Goal status: on going 12/25/2022   5.  Patient will demonstrate Lt shoulder MMT equal to Rt s symptoms for improved lifting, reaching, carrying in daily life.   Goal status: on going 12/25/2022   6.  Patient will demonstrate/report ability to perform usual house/yard activity at PLOF s limitation.  Goal status: on going 12/25/2022    PLAN:  PT FREQUENCY: 1-2x/week  PT DURATION: 12 weeks  PLANNED INTERVENTIONS: Therapeutic exercises, Therapeutic activity, Neuro Muscular re-education, Balance training, Gait training, Patient/Family education, Joint mobilization, Stair training, DME instructions, Dry Needling, Electrical stimulation, Cryotherapy, vasopneumatic device,Traction, Moist heat, Taping, Ultrasound, Ionotophoresis 25m/ml Dexamethasone, and Manual therapy.  All included unless contraindicated  PLAN FOR NEXT SESSION: Continue strengthening program and recheck mobility of cervical.   MScot Jun PT, DPT, OCS, ATC 01/01/23  3:38 PM      Referring diagnosis? M54.12 (ICD-10-CM) - Radiculopathy, cervical region Treatment diagnosis? (if different than referring diagnosis) M54.2 What was this (referring dx) caused by? []$  Surgery []$  Fall [x]$  Ongoing issue []$  Arthritis []$  Other: ____________  Laterality: []$  Rt [x]$   Lt []$  Both  Check all possible CPT codes:  *CHOOSE 10 OR LESS*    []$  97110 (Therapeutic Exercise)  []$  92507 (SLP Treatment)  []$   97112 (Neuro Re-ed)   []$  92526 (Swallowing Treatment)   []$  97116 (Gait Training)   []$  [phone] (Cognitive Training, 1st 15 minutes) []$  97140 (Manual Therapy)   []$  97130 (Cognitive Training, each add'l 15 minutes)  []$  97164 (Re-evaluation)                              []$  Other, List CPT Code ____________  []$  [id number] (Therapeutic Activities)     []$  [id number] (Self Care)   [x]$  All codes above (97110 - 97535)  [x]$  97012 (Mechanical Traction)  []$  97014 (E-stim Unattended)  [x]$  97032 (E-stim manual)  []$  97033 (Ionto)  []$  97035 (Ultrasound) [x]$  97750 (Physical Performance Training) []$  [id number] (Aquatic Therapy) []$  97016 (Vasopneumatic Device) []$  [id number] (Paraffin) []$  97034 (Contrast Bath) []$  97597 (Wound Care 1st 20 sq cm) []$  97598 (Wound Care each add'l 20 sq cm) []$  97760 (Orthotic Fabrication, Fitting, Training Initial) []$  [id number] (Prosthetic Management and Training Initial) []$  [id number] (Orthotic or Prosthetic Training/ Modification Subsequent)

## 2023-01-03 ENCOUNTER — Ambulatory Visit: Payer: Medicare HMO | Admitting: Rehabilitative and Restorative Service Providers"

## 2023-01-03 ENCOUNTER — Encounter: Payer: Self-pay | Admitting: Rehabilitative and Restorative Service Providers"

## 2023-01-03 DIAGNOSIS — M25512 Pain in left shoulder: Secondary | ICD-10-CM

## 2023-01-03 DIAGNOSIS — G8929 Other chronic pain: Secondary | ICD-10-CM

## 2023-01-03 DIAGNOSIS — R293 Abnormal posture: Secondary | ICD-10-CM

## 2023-01-03 DIAGNOSIS — M6281 Muscle weakness (generalized): Secondary | ICD-10-CM | POA: Diagnosis not present

## 2023-01-03 DIAGNOSIS — M542 Cervicalgia: Secondary | ICD-10-CM

## 2023-01-03 NOTE — Therapy (Signed)
OUTPATIENT PHYSICAL THERAPY TREATMENT   Patient Name: Dustin [name] MRN: [id number] DOB:Oct 30, 1951, 72 y.o., male Today's Date: 01/03/2023  END OF SESSION:  PT End of Session - 01/03/23 1459     Visit Number 7    Number of Visits 24    Date for PT Re-Evaluation 03/07/23    Authorization Type [employer] $25 copay    Authorization - Visit Number 7    Authorization - Number of Visits 12    Progress Note Due on Visit 10    PT Start Time [id number]    PT Stop Time 1538    PT Time Calculation (min) 40 min    Activity Tolerance Patient tolerated treatment well    Behavior During Therapy [facility] for tasks assessed/performed              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   Hypertension    Hypothyroidism 07/01/2018   Paroxysmal A-fib ([place])    S/P mitral valve repair 07/08/2017   Spinal stenosis of lumbar region 08/22/2021   Past Surgical History:  Procedure Laterality Date   CARDIAC CATHETERIZATION  05/14/2017   hx ablation for A-flutter  11/01/2017   MITRAL VALVE REPAIR (MV)/CORONARY ARTERY BYPASS GRAFTING (CABG)  07/08/2017   SVG-OM, LAA closure, MV reconstruction with annuloplasty and sliding plasty via superior septal approach/Medtronic Simplici-T Annuloplasty System, left GSV harvest 07/08/17 by Jomarie Longs, MD at [facility]     removed as a child   Patient Active Problem List   Diagnosis Date Noted   S/P lumbar fusion 12/01/2021   Lumbar stenosis 11/29/2021   S/P mitral valve repair 07/01/2018   OSA (obstructive sleep apnea) 07/01/2018   GERD (gastroesophageal reflux disease) 07/01/2018   Hyperlipidemia 07/01/2018   Erectile dysfunction 07/01/2018   Hypothyroid 07/01/2018    PCP: Dineen Kid MD  REFERRING PROVIDER: Callie Fielding, MD  REFERRING DIAG: M54.12 (ICD-10-CM) -  Radiculopathy, cervical region  THERAPY DIAG:  Cervicalgia  Chronic left shoulder pain  Muscle weakness (generalized)  Abnormal posture  Rationale for Evaluation and Treatment: Rehabilitation  ONSET DATE: approx. 06/2022  SUBJECTIVE:                                                                                                                                                                                                         SUBJECTIVE  STATEMENT: He reported stiffness in morning, improved some c exercise.    PERTINENT HISTORY:  CKD, CAD, GERD, HTN, hypothyroidism, lumbar spinal stenosis  PAIN:  NPRS scale: 0/10 pain Pain location: Lt neck/shoulder Pain description: stiff, weak Aggravating factors: end ranges at times, carrying heavier weight Relieving factors: general neck mobility exercises  PRECAUTIONS: None  WEIGHT BEARING RESTRICTIONS: No  FALLS:  Has patient fallen in last 6 months? No  LIVING ENVIRONMENT: Lives in: House/apartment  OCCUPATION: Retired  PLOF: Independent, [employer] about 1x/week to 2x/week (has been limited), Rt handed.  Wife has chickens and he helps take care.   PATIENT GOALS:  Loosen up and get stronger, improve balance.     OBJECTIVE:   IMAGING REVIEW: 12/13/2022:  MRI IMPRESSION: 1. Advanced degenerative changes in the cervical spine as detailed above resulting in moderate to severe spinal canal stenosis at C3-C4 and severe spinal canal stenosis at C4-C5. No convincing cord signal abnormality. 2. Multilevel severe neural foraminal stenosis as detailed above. 3. Marrow edema in the posterior elements on the right at C3-C4 on the left at C4-C5 which may reflect a source of pain.  PATIENT SURVEYS:  01/01/2023  FOTO update:  61  12/13/2022 FOTO intake:  64  predicted:  71  COGNITION: 12/13/2022 Overall cognitive status: Within functional limits for tasks assessed  SENSATION: 12/13/2022 Light touch assessment equal bilateral  for UE  POSTURE:  12/13/2022 Forward head posture, rounded shoulders, mild increase in thoracic kyphosis  PALPATION: 12/13/2022 No specific tenderness to light touch   CERVICAL ROM:   ROM AROM (deg) 12/13/2022 AROM 12/20/2022 AROM 01/03/2023  Flexion 70 70   Extension 58 60 70  Right lateral flexion     Left lateral flexion     Right rotation 75 73 78  Left rotation 68 70 74  12/03/2022:  No speciifc radicular symptoms worsened c movements in AROM cervical.    (Blank rows = not tested)  UPPER EXTREMITY ROM:  ROM Right 12/13/2022 Left 12/13/2022  Shoulder flexion [facility]  Shoulder extension    Shoulder abduction    Shoulder adduction    Shoulder extension    Shoulder internal rotation    Shoulder external rotation    Elbow flexion    Elbow extension    Wrist flexion    Wrist extension    Wrist ulnar deviation    Wrist radial deviation    Wrist pronation    Wrist supination     (Blank rows = not tested)  UPPER EXTREMITY MMT:  MMT Right 12/13/2022 Left 12/13/2022 Left 12/27/2022  Shoulder flexion 5/5 4/5 4+/5  Shoulder extension     Shoulder abduction 5/5 4/5 4/5  Shoulder adduction     Shoulder extension     Shoulder internal rotation 5/5 5/5 5/5  Shoulder external rotation 5/5 4/5 4+/5  Middle trapezius     Lower trapezius     Elbow flexion     Elbow extension     Wrist flexion     Wrist extension     Wrist ulnar deviation     Wrist radial deviation     Wrist pronation     Wrist supination     Grip strength      (Blank rows = not tested)   CERVICAL SPECIAL TESTS:  12/13/2022 No specific testing performed due to not having specific pain in evaluation today.   FUNCTIONAL TESTS:  12/13/2022 No specific testing performed today.    TODAY'S TREATMENT:  DATE: 01/03/2023 Therex UBE fwd/back 4 mins each way c :10 second interval faster at :50-:60 of each minute lvl 3.0 Chest  press machine Lt arm only c SA plus press  2 x 15 bilateral 5 lbs Machine rows Lt arm only 15 lbs 2 x 15, performed bilaterally Lat pull down machine 2 x 15 25 lbs  Standing bilateral shoulder flexion 90 deg to abduction to side and reverse 1 lb 2 x 10  Seated thoracic extension in chair 5 sec hold x 10  Seated band ER c scapular retraction green band 2 x 10  Seated cervical rotation towel assist x 10 bilateral  TODAY'S TREATMENT:                                                                                 DATE: 01/01/2023 Therex Nustep Lvl 5 UE/LE 10 mins  Standing green band ER c towel under arm 2 x 15 c fatigue hold on last rep Standing green band IR c towel under arm 2 x 15 Lt arm Chest press machine Lt arm only c SA plus press  x 15 bilateral 5 lbs Machine rows Lt arm only 15 lbs 2 x 15, performed bilaterally Standing bilateral shoulder flexion 90 deg to abduction to side and reverse 1 lb 2 x 10  Wall push up c serratus plus press 2 x 10    TODAY'S TREATMENT:                                                                                 DATE: 12/27/2022 Therex UBE UE/LE 4 mins fwd/back each way lvl 3.0 Standing blue band rows 2 x 15 Standing blue band GH ext 2 x 15 Standing green band ER c towel under arm 2 x 15 bilateral Shoulder flexion with elbow flexed to 90 deg 2 lbs 2 x 15 Lt Chest press machine Lt arm only c SA plus press x 10 bilateral 5 lbs    PATIENT EDUCATION:  Education details: HEP, POC Person educated: Patient Education method: Consulting civil engineer, Media planner, Verbal cues, and Handouts Education comprehension: verbalized understanding, returned demonstration, and verbal cues required  HOME EXERCISE PROGRAM: Access Code: [email or web address] URL: [email or web address] Date: 12/17/2022 Prepared by: Elsie Ra  Exercises - Shoulder External Rotation and Scapular Retraction  - 3-5 x daily - 7 x weekly - 1 sets - 10 reps - 5 hold - Seated Assisted Cervical  Rotation with Towel  - 2-3 x daily - 7 x weekly - 1 sets - 5-10 reps - 5 hold - Shoulder External Rotation with Anchored Resistance  - 2 x daily - 7 x weekly - 3 sets - 10 reps - Shoulder extension with resistance - Neutral  - 2 x daily - 6 x weekly - 2-3 sets - 10 reps - Standing Shoulder Flexion to 90 Degrees with Dumbbells  - 2 x daily -  6 x weekly - 3 sets - 5 reps - Shoulder Abduction with Dumbbells - Thumbs Up  - 2 x daily - 6 x weekly - 3 sets - 5 reps - Single Leg Stance  - 2 x daily - 6 x weekly - 1 sets - 5 reps - 10 sec hold - Tandem Stance  - 2 x daily - 6 x weekly - 1 sets - 2-3 reps - 30 sec hold  ASSESSMENT:  CLINICAL IMPRESSION: Recheck of objective range of motion showed improvements. Lt arm quality of movement in intervention still vunerable to increased fatigue due to weakness.   OBJECTIVE IMPAIRMENTS: decreased activity tolerance, decreased coordination, decreased mobility, decreased ROM, decreased strength, impaired perceived functional ability, impaired flexibility, impaired UE functional use, improper body mechanics, postural dysfunction, and pain.   ACTIVITY LIMITATIONS: carrying, lifting, and reach over head  PARTICIPATION LIMITATIONS: meal prep, cleaning, interpersonal relationship, community activity, and yard work  PERSONAL FACTORS:  CKD, CAD, GERD, HTN, hypothyroidism, lumbar spinal stenosis, length of time since onset  are also affecting patient's functional outcome.   REHAB POTENTIAL: Good  CLINICAL DECISION MAKING: Stable/uncomplicated  EVALUATION COMPLEXITY: Low   GOALS: Goals reviewed with patient? Yes  SHORT TERM GOALS: (target date for Short term goals are 3 weeks 01/03/2023)  1.Patient will demonstrate independent use of home exercise program to maintain progress from in clinic treatments. Goal status: met  LONG TERM GOALS: (target dates for all long term goals are 12 weeks  03/07/2023 )   1. Patient will demonstrate/report pain at worst less  than or equal to 2/10 to facilitate minimal limitation in daily activity secondary to pain symptoms. Goal status: on going 12/25/2022   2. Patient will demonstrate independent use of home exercise program to facilitate ability to maintain/progress functional gains from skilled physical therapy services. Goal status: on going 12/25/2022   3. Patient will demonstrate FOTO outcome > or = 71 % to indicate reduced disability due to condition. Goal status: on going 12/25/2022   4.  Patient will demonstrate cervical AROM WFL s symptoms to facilitate usual head movements for daily activity including driving, self care.   Goal status: on going 12/25/2022   5.  Patient will demonstrate Lt shoulder MMT equal to Rt s symptoms for improved lifting, reaching, carrying in daily life.   Goal status: on going 12/25/2022   6.  Patient will demonstrate/report ability to perform usual house/yard activity at PLOF s limitation.  Goal status: on going 12/25/2022    PLAN:  PT FREQUENCY: 1-2x/week  PT DURATION: 12 weeks  PLANNED INTERVENTIONS: Therapeutic exercises, Therapeutic activity, Neuro Muscular re-education, Balance training, Gait training, Patient/Family education, Joint mobilization, Stair training, DME instructions, Dry Needling, Electrical stimulation, Cryotherapy, vasopneumatic device,Traction, Moist heat, Taping, Ultrasound, Ionotophoresis 27m/ml Dexamethasone, and Manual therapy.  All included unless contraindicated  PLAN FOR NEXT SESSION: Strengthening plan   MScot Jun PT, DPT, OCS, ATC 01/03/23  3:41 PM      Referring diagnosis? M54.12 (ICD-10-CM) - Radiculopathy, cervical region Treatment diagnosis? (if different than referring diagnosis) M54.2 What was this (referring dx) caused by? []$  Surgery []$  Fall [x]$  Ongoing issue []$  Arthritis []$  Other: ____________  Laterality: []$  Rt [x]$  Lt []$  Both  Check all possible CPT codes:  *CHOOSE 10 OR LESS*    []$  97110 (Therapeutic Exercise)  []$   92507 (SLP Treatment)  []$  97112 (Neuro Re-ed)   []$  92526 (Swallowing Treatment)   []$  [id number](Gait Training)   []$  [id number](Cognitive Training, 1st 15  minutes) []$  97140 (Manual Therapy)   []$  97130 (Cognitive Training, each add'l 15 minutes)  []$  97164 (Re-evaluation)                              []$  Other, List CPT Code ____________  []$  97530 (Therapeutic Activities)     []$  97535 (Self Care)   [x]$  All codes above (97110 - 97535)  [x]$  97012 (Mechanical Traction)  []$  97014 (E-stim Unattended)  [x]$  97032 (E-stim manual)  []$  97033 (Ionto)  []$  97035 (Ultrasound) [x]$  97750 (Physical Performance Training) []$  [id number] (Aquatic Therapy) []$  97016 (Vasopneumatic Device) []$  [id number] (Paraffin) []$  97034 (Contrast Bath) []$  97597 (Wound Care 1st 20 sq cm) []$  97598 (Wound Care each add'l 20 sq cm) []$  19147 (Orthotic Fabrication, Fitting, Training Initial) []$  [id number] (Prosthetic Management and Training Initial) []$  [id number] (Orthotic or Prosthetic Training/ Modification Subsequent)

## 2023-01-08 ENCOUNTER — Encounter: Payer: Self-pay | Admitting: Rehabilitative and Restorative Service Providers"

## 2023-01-08 ENCOUNTER — Ambulatory Visit: Payer: Medicare HMO | Admitting: Rehabilitative and Restorative Service Providers"

## 2023-01-08 DIAGNOSIS — M542 Cervicalgia: Secondary | ICD-10-CM

## 2023-01-08 NOTE — Therapy (Signed)
OUTPATIENT PHYSICAL THERAPY TREATMENT   Patient Name: Dustin [name] MRN: [id number] DOB:1951-03-01, 72 y.o., male Today's Date: 01/08/2023  END OF SESSION:  PT End of Session - 01/08/23 1512     Visit Number 8    Number of Visits 24    Date for PT Re-Evaluation 03/07/23    Authorization Type [employer] $25 copay    Authorization - Visit Number 8    Authorization - Number of Visits 12    Progress Note Due on Visit 65    PT Start Time [id number]    PT Stop Time [id number]    PT Time Calculation (min) 42 min    Activity Tolerance Patient tolerated treatment well    Behavior During Therapy WFL for tasks assessed/performed               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   Hypertension    Hypothyroidism 07/01/2018   Paroxysmal A-fib ([place])    S/P mitral valve repair 07/08/2017   Spinal stenosis of lumbar region 08/22/2021   Past Surgical History:  Procedure Laterality Date   CARDIAC CATHETERIZATION  05/14/2017   hx ablation for A-flutter  11/01/2017   MITRAL VALVE REPAIR (MV)/CORONARY ARTERY BYPASS GRAFTING (CABG)  07/08/2017   SVG-OM, LAA closure, MV reconstruction with annuloplasty and sliding plasty via superior septal approach/Medtronic Simplici-T Annuloplasty System, left GSV harvest 07/08/17 by Jomarie Longs, MD at [facility]     removed as a child   Patient Active Problem List   Diagnosis Date Noted   S/P lumbar fusion 12/01/2021   Lumbar stenosis 11/29/2021   S/P mitral valve repair 07/01/2018   OSA (obstructive sleep apnea) 07/01/2018   GERD (gastroesophageal reflux disease) 07/01/2018   Hyperlipidemia 07/01/2018   Erectile dysfunction 07/01/2018   Hypothyroid 07/01/2018    PCP: Dineen Kid MD  REFERRING PROVIDER: Callie Fielding, MD  REFERRING DIAG: M54.12 (ICD-10-CM) -  Radiculopathy, cervical region  THERAPY DIAG:  Cervicalgia  Rationale for Evaluation and Treatment: Rehabilitation  ONSET DATE: approx. 06/2022  SUBJECTIVE:                                                                                                                                                                                                         SUBJECTIVE STATEMENT: He indicated continued general progress at this time.  He  stated getting back to gym and a good session last week.  Global rating of change +5 quite a bit better.   He indicated no avoidance of activity due to symptoms at this time.   PERTINENT HISTORY:  CKD, CAD, GERD, HTN, hypothyroidism, lumbar spinal stenosis  PAIN:  NPRS scale: 0/10 upon arrival, at worst in last week:  1-2/10 Pain location: Lt neck/shoulder Pain description: stiff, weak Aggravating factors: end ranges at times, carrying heavier weight Relieving factors: general neck mobility exercises  PRECAUTIONS: None  WEIGHT BEARING RESTRICTIONS: No  FALLS:  Has patient fallen in last 6 months? No  LIVING ENVIRONMENT: Lives in: House/apartment  OCCUPATION: Retired  PLOF: Independent, [employer] about 1x/week to 2x/week (has been limited), Rt handed.  Wife has chickens and he helps take care.   PATIENT GOALS:  Loosen up and get stronger, improve balance.     OBJECTIVE:   IMAGING REVIEW: 12/13/2022:  MRI IMPRESSION: 1. Advanced degenerative changes in the cervical spine as detailed above resulting in moderate to severe spinal canal stenosis at C3-C4 and severe spinal canal stenosis at C4-C5. No convincing cord signal abnormality. 2. Multilevel severe neural foraminal stenosis as detailed above. 3. Marrow edema in the posterior elements on the right at C3-C4 on the left at C4-C5 which may reflect a source of pain.  PATIENT SURVEYS:  01/08/2023:  FOTO update:  68  01/01/2023  FOTO update:  61  12/13/2022 FOTO intake:  64  predicted:   71  COGNITION: 12/13/2022 Overall cognitive status: Within functional limits for tasks assessed  SENSATION: 12/13/2022 Light touch assessment equal bilateral for UE  POSTURE:  12/13/2022 Forward head posture, rounded shoulders, mild increase in thoracic kyphosis  PALPATION: 12/13/2022 No specific tenderness to light touch   CERVICAL ROM:   ROM AROM (deg) 12/13/2022 AROM 12/20/2022 AROM 01/03/2023 AROM 01/08/2023  Flexion 70 70  71  Extension 58 60 70 70  Right lateral flexion      Left lateral flexion      Right rotation 75 73 78 75  Left rotation 68 70 74 75  12/03/2022:  No speciifc radicular symptoms worsened c movements in AROM cervical.    (Blank rows = not tested)  UPPER EXTREMITY ROM:  ROM Right 12/13/2022 Left 12/13/2022  Shoulder flexion [facility]  Shoulder extension    Shoulder abduction    Shoulder adduction    Shoulder extension    Shoulder internal rotation    Shoulder external rotation    Elbow flexion    Elbow extension    Wrist flexion    Wrist extension    Wrist ulnar deviation    Wrist radial deviation    Wrist pronation    Wrist supination     (Blank rows = not tested)  UPPER EXTREMITY MMT:  MMT Right 12/13/2022 Left 12/13/2022 Left 12/27/2022 Left 01/08/2023  Shoulder flexion 5/5 4/5 4+/5 4+/5  Shoulder extension      Shoulder abduction 5/5 4/5 4/5 4+/5  Shoulder adduction      Shoulder extension      Shoulder internal rotation 5/5 5/5 5/5 5/5  Shoulder external rotation 5/5 4/5 4+/5 4+/5  Middle trapezius      Lower trapezius      Elbow flexion      Elbow extension      Wrist flexion      Wrist extension      Wrist ulnar deviation      Wrist radial deviation      Wrist  pronation      Wrist supination      Grip strength       (Blank rows = not tested)   CERVICAL SPECIAL TESTS:  12/13/2022 No specific testing performed due to not having specific pain in evaluation today.   FUNCTIONAL TESTS:  12/13/2022 No specific testing  performed today.    TODAY'S TREATMENT:                                                                                 DATE: 01/08/2023 Therex UBE fwd/back 4 mins each way lvl 3.0 Blue band rows c scapular retraction focus x 20  Blue band GH ext 2-3 sec hold past neutral x 20  Standing bilateral shoulder flexion 90 deg to abduction to side and reverse 1 lb 2 x 10  Seated thoracic extension in chair 5 sec hold x 10  Seated band ER c scapular retraction blue band 2 x 10 , performed bilaterally   Review of HEP for cues.   TODAY'S TREATMENT:                                                                                 DATE: 01/03/2023 Therex UBE fwd/back 4 mins each way c :10 second interval faster at :50-:60 of each minute lvl 3.0 Chest press machine Lt arm only c SA plus press  2 x 15 bilateral 5 lbs Machine rows Lt arm only 15 lbs 2 x 15, performed bilaterally Lat pull down machine 2 x 15 25 lbs  Standing bilateral shoulder flexion 90 deg to abduction to side and reverse 1 lb 2 x 10  Seated thoracic extension in chair 5 sec hold x 10  Seated band ER c scapular retraction green band 2 x 10  Seated cervical rotation towel assist x 10 bilateral  TODAY'S TREATMENT:                                                                                 DATE: 01/01/2023 Therex Nustep Lvl 5 UE/LE 10 mins  Standing green band ER c towel under arm 2 x 15 c fatigue hold on last rep Standing green band IR c towel under arm 2 x 15 Lt arm Chest press machine Lt arm only c SA plus press  x 15 bilateral 5 lbs Machine rows Lt arm only 15 lbs 2 x 15, performed bilaterally Standing bilateral shoulder flexion 90 deg to abduction to side and reverse 1 lb 2 x 10  Wall push up c serratus plus press 2 x 10  PATIENT EDUCATION:  01/08/2023 Education details: HEP update Person educated: Patient Education method: Explanation, Demonstration, Verbal cues, and Handouts Education comprehension: verbalized  understanding, returned demonstration, and verbal cues required  HOME EXERCISE PROGRAM: Access Code: [email or web address] URL: [email or web address] Date: 01/08/2023 Prepared by: Scot Jun  Exercises - Shoulder External Rotation and Scapular Retraction  - 3-5 x daily - 7 x weekly - 1 sets - 10 reps - 5 hold - Seated Thoracic Lumbar Extension  - 1-2 x daily - 7 x weekly - 1 sets - 10 reps - Isometric Cervical Extension at Wall with Ball  - 1-2 x daily - 7 x weekly - 1 sets - 5-10 reps - 5 hold - Seated Assisted Cervical Rotation with Towel  - 2-3 x daily - 7 x weekly - 1 sets - 5-10 reps - 5 hold - Shoulder External Rotation with Anchored Resistance  - 2 x daily - 7 x weekly - 3 sets - 10 reps - Shoulder extension with resistance - Neutral  - 2 x daily - 6 x weekly - 2-3 sets - 10 reps - Standing Shoulder Flexion to 90 Degrees with Dumbbells  - 2 x daily - 6 x weekly - 3 sets - 5 reps - Shoulder Abduction with Dumbbells - Thumbs Up  - 2 x daily - 6 x weekly - 3 sets - 5 reps - Single Leg Stance  - 2 x daily - 6 x weekly - 1 sets - 5 reps - 10 sec hold - Tandem Stance  - 2 x daily - 6 x weekly - 1 sets - 2-3 reps - 30 sec hold  ASSESSMENT:  CLINICAL IMPRESSION: Pt has attended 8 visits overall during course of treatment.  He has reported minimal pain symptoms at this time and reduced functional limitation in daily activity.  See objective data for updated information regarding current presentation showing change.  At this time, Pt has shown good knowledge of HEP and improved tolerance to daily activity.  Pt to follow up with MD next week and then return to PT clinic for possible trial HEP transitioning based off reports.   OBJECTIVE IMPAIRMENTS: decreased activity tolerance, decreased coordination, decreased mobility, decreased ROM, decreased strength, impaired perceived functional ability, impaired flexibility, impaired UE functional use, improper body mechanics, postural dysfunction,  and pain.   ACTIVITY LIMITATIONS: carrying, lifting, and reach over head  PARTICIPATION LIMITATIONS: meal prep, cleaning, interpersonal relationship, community activity, and yard work  PERSONAL FACTORS:  CKD, CAD, GERD, HTN, hypothyroidism, lumbar spinal stenosis, length of time since onset  are also affecting patient's functional outcome.   REHAB POTENTIAL: Good  CLINICAL DECISION MAKING: Stable/uncomplicated  EVALUATION COMPLEXITY: Low   GOALS: Goals reviewed with patient? Yes  SHORT TERM GOALS: (target date for Short term goals are 3 weeks 01/03/2023)  1.Patient will demonstrate independent use of home exercise program to maintain progress from in clinic treatments. Goal status: met  LONG TERM GOALS: (target dates for all long term goals are 12 weeks  03/07/2023 )   1. Patient will demonstrate/report pain at worst less than or equal to 2/10 to facilitate minimal limitation in daily activity secondary to pain symptoms. Goal status: Met 01/08/2023   2. Patient will demonstrate independent use of home exercise program to facilitate ability to maintain/progress functional gains from skilled physical therapy services. Goal status: on going 01/08/2023   3. Patient will demonstrate FOTO outcome > or = 71 % to indicate reduced disability due to condition. Goal status: on going 12/25/2022  4.  Patient will demonstrate cervical AROM WFL s symptoms to facilitate usual head movements for daily activity including driving, self care.   Goal status: on going 01/08/2023   5.  Patient will demonstrate Lt shoulder MMT equal to Rt s symptoms for improved lifting, reaching, carrying in daily life.   Goal status: on going 01/08/2023   6.  Patient will demonstrate/report ability to perform usual house/yard activity at PLOF s limitation.  Goal status: on going 01/08/2023    PLAN:  PT FREQUENCY: 1-2x/week  PT DURATION: 12 weeks  PLANNED INTERVENTIONS: Therapeutic exercises, Therapeutic  activity, Neuro Muscular re-education, Balance training, Gait training, Patient/Family education, Joint mobilization, Stair training, DME instructions, Dry Needling, Electrical stimulation, Cryotherapy, vasopneumatic device,Traction, Moist heat, Taping, Ultrasound, Ionotophoresis 47m/ml Dexamethasone, and Manual therapy.  All included unless contraindicated  PLAN FOR NEXT SESSION: Followup on MD visit, possible HEP transitioning.   MScot Jun PT, DPT, OCS, ATC 01/08/23  3:49 PM      Referring diagnosis? M54.12 (ICD-10-CM) - Radiculopathy, cervical region Treatment diagnosis? (if different than referring diagnosis) M54.2 What was this (referring dx) caused by? []$  Surgery []$  Fall [x]$  Ongoing issue []$  Arthritis []$  Other: ____________  Laterality: []$  Rt [x]$  Lt []$  Both  Check all possible CPT codes:  *CHOOSE 10 OR LESS*    []$  97110 (Therapeutic Exercise)  []$  92507 (SLP Treatment)  []$  97112 (Neuro Re-ed)   []$  92526 (Swallowing Treatment)   []$  97116 (Gait Training)   []$  [id number](Cognitive Training, 1st 15 minutes) []$  97140 (Manual Therapy)   []$  97130 (Cognitive Training, each add'l 15 minutes)  []$  97164 (Re-evaluation)                              []$  Other, List CPT Code ____________  []$  97530 (Therapeutic Activities)     []$  97535 (Self Care)   [x]$  All codes above (97110 - 97535)  [x]$  97012 (Mechanical Traction)  []$  97014 (E-stim Unattended)  [x]$  97032 (E-stim manual)  []$  97033 (Ionto)  []$  97035 (Ultrasound) [x]$  97750 (Physical Performance Training) []$  [id number](Aquatic Therapy) []$  97016 (Vasopneumatic Device) []$  [id number](Paraffin) []$  97034 (Contrast Bath) []$  97597 (Wound Care 1st 20 sq cm) []$  97598 (Wound Care each add'l 20 sq cm) []$  97760 (Orthotic Fabrication, Fitting, Training Initial) []$  [id number](Prosthetic Management and Training Initial) []$  [id number](Orthotic or Prosthetic Training/ Modification Subsequent)

## 2023-01-10 ENCOUNTER — Encounter: Payer: Medicare HMO | Admitting: Rehabilitative and Restorative Service Providers"

## 2023-01-16 ENCOUNTER — Ambulatory Visit: Payer: Medicare HMO | Admitting: Orthopedic Surgery

## 2023-01-16 DIAGNOSIS — M5412 Radiculopathy, cervical region: Secondary | ICD-10-CM

## 2023-01-16 NOTE — Progress Notes (Signed)
Orthopedic Spine Surgery Office Note  Assessment: Patient is a 72 y.o. male with left shoulder weakness.  Cervical MRI shows multiple levels of foraminal stenosis but given the distribution of his weakness, suspect the C4 and C5 nerves are involved   Plan: -Explained that his weakness is likely coming from cervical radiculopathy.  He has tried nonoperative treatment so ultimately it would be his decision if he wanted to proceed with surgery.  Explained that there are risks associated with surgery so if he is pain-free and content with his level of function, it would probably be best to forego surgery.  He agreed with that rationale and wanted to continue to monitor at this time -Patient has tried activity modification, PT, strengthening exercises at home -Patient should return to office in 8 weeks, x-rays at next visit: none   Patient expressed understanding of the plan and all questions were answered to the patient's satisfaction.   __________________________________________________________________________  History: Patient is a 72 y.o. male who has been previously seen in the office for symptoms of left shoulder weakness.  Patient had had several months of left shoulder weakness at the last visit.  Since last time I saw him, he has been doing physical therapy.  He does not feel that he has made much improvement in terms of his strength.  He is also doing home exercises on a regular basis to try and improve his strength.  His weakness is with external rotation at the shoulder and elevation of the shoulder.  He has no symptoms on the right side.  He has no weakness distal to the shoulder on the left side.  He is not having any pain.  He is able to function and feels he can do his activities of daily living without issue.  Denies paresthesias and numbness.  Previous treatments: activity modification, PT, strengthening exercises at home  Physical Exam:  General: no acute distress, appears stated  age Neurologic: alert, answering questions appropriately, following commands Respiratory: unlabored breathing on room air, symmetric chest rise Psychiatric: appropriate affect, normal cadence to speech   MSK (spine):  -Strength exam      Left  Right Grip strength                5/5  5/5 Interosseus   5/5   5/5 Wrist extension  5/5  5/5 Wrist flexion   5/5  5/5 Elbow flexion   5/5  5/5 Deltoid    4/5  5/5  EHL    4/5  4/5 TA    5/5  5/5 GSC    5/5  5/5 Knee extension  5/5  5/5 Hip flexion   5/5  5/5  -Sensory exam    Sensation intact to light touch in L3-S1 nerve distributions of bilateral lower extremities  Sensation intact to light touch in C5-T1 nerve distributions of bilateral upper extremities  -Brachioradialis DTR: 1/4 on the left, 1/4 on the right -Biceps DTR: 0/4 on the left, 1/4 on the right -Achilles DTR: 1/4 on the left, 1/4 on the right -Patellar tendon DTR: 2/4 on the left, 2/4 on the right  -Spurling: Negative bilaterally -Hoffman sign: Negative bilaterally -Clonus: No beats bilaterally -Interosseous wasting: None seen -Grip and release test: Negative -Romberg: Negative -Gait: Normal -Imbalance with tandem gait: No  Left shoulder exam: No pain through range of motion Right shoulder exam: No pain through range of motion  Imaging: XR of the cervical spine from 12/05/2022 and 12/202/2023 was previously independently reviewed and interpreted,  showing disc height loss at C3/4, C4/5, C5/6, C6/7. Lordotic alignment. T1 slope of 33 degrees. C2 slope of 12 degrees. Spondylolisthesis at C2/3 measures 2.59m on flexion and reduces on extension.   MRI of the cervical spine from 12/04/2022 was previously independently reviewed and interpreted, showing central and foraminal stenosis bilaterally at C3/4. Central and foraminal stenosis bilaterally at C4/5. Bilateral foraminal stenosis at C5/6. Right sided foraminal stenosis at C6/7. DDD at C3/4, C4/5, C5/6, C6/7.      Patient name: Dustin KAEMMERERPatient MRN: [id number] of visit: 01/16/23

## 2023-01-21 ENCOUNTER — Encounter: Payer: Self-pay | Admitting: Rehabilitative and Restorative Service Providers"

## 2023-01-21 ENCOUNTER — Ambulatory Visit: Payer: Medicare HMO | Admitting: Rehabilitative and Restorative Service Providers"

## 2023-01-21 DIAGNOSIS — M25512 Pain in left shoulder: Secondary | ICD-10-CM | POA: Diagnosis not present

## 2023-01-21 DIAGNOSIS — M6281 Muscle weakness (generalized): Secondary | ICD-10-CM

## 2023-01-21 DIAGNOSIS — G8929 Other chronic pain: Secondary | ICD-10-CM

## 2023-01-21 DIAGNOSIS — M542 Cervicalgia: Secondary | ICD-10-CM

## 2023-01-21 DIAGNOSIS — R293 Abnormal posture: Secondary | ICD-10-CM | POA: Diagnosis not present

## 2023-01-21 NOTE — Therapy (Signed)
OUTPATIENT PHYSICAL THERAPY TREATMENT [facility]   Patient Name: Dustin [name] MRN: [id number] DOB:[birth year], 72 y.o., male Today's Date: 01/21/2023   PHYSICAL THERAPY DISCHARGE SUMMARY  Visits from Start of Care: 9  Current functional level related to goals / functional outcomes: See note   Remaining deficits: See note   Education / Equipment: HEP  Patient goals were  mostly met . Patient is being discharged due to being pleased with the current functional level.   END OF SESSION:  PT End of Session - 01/21/23 1412     Visit Number 9    Number of Visits 24    Date for PT Re-Evaluation 03/07/23    Authorization Type [employer] $25 copay    Authorization - Visit Number 9    Authorization - Number of Visits 12    Progress Note Due on Visit 18    PT Start Time [id number]    PT Stop Time 1436    PT Time Calculation (min) 24 min    Activity Tolerance Patient tolerated treatment well    Behavior During Therapy WFL for tasks assessed/performed                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   Hypertension    Hypothyroidism 07/01/2018   Paroxysmal A-fib ([place])    S/P mitral valve repair 07/08/2017   Spinal stenosis of lumbar region 08/22/2021   Past Surgical History:  Procedure Laterality Date   CARDIAC CATHETERIZATION  05/14/2017   hx ablation for A-flutter  11/01/2017   MITRAL VALVE REPAIR (MV)/CORONARY ARTERY BYPASS GRAFTING (CABG)  07/08/2017   SVG-OM, LAA closure, MV reconstruction with annuloplasty and sliding plasty via superior septal approach/Medtronic Simplici-T Annuloplasty System, left GSV harvest 07/08/17 by Jomarie Longs, MD at [facility]     removed as a child   Patient Active Problem List   Diagnosis Date Noted   S/P lumbar fusion 12/01/2021   Lumbar stenosis  11/29/2021   S/P mitral valve repair 07/01/2018   OSA (obstructive sleep apnea) 07/01/2018   GERD (gastroesophageal reflux disease) 07/01/2018   Hyperlipidemia 07/01/2018   Erectile dysfunction 07/01/2018   Hypothyroid 07/01/2018    PCP: Dineen Kid MD  REFERRING PROVIDER: Callie Fielding, MD  REFERRING DIAG: M54.12 (ICD-10-CM) - Radiculopathy, cervical region  THERAPY DIAG:  Cervicalgia  Chronic left shoulder pain  Muscle weakness (generalized)  Abnormal posture  Rationale for Evaluation and Treatment: Rehabilitation  ONSET DATE: approx. 06/2022  SUBJECTIVE:  SUBJECTIVE STATEMENT: He indicated not feeling as stiff the last few days.  Saw MD and indicated follow up back in 8 weeks or so but agreed to transition to HEP.    PERTINENT HISTORY:  CKD, CAD, GERD, HTN, hypothyroidism, lumbar spinal stenosis  PAIN:  NPRS scale: 0/10 upon arrival, at worst in last week:  1-2/10 Pain location: Lt neck/shoulder Pain description: stiff, weak Aggravating factors: end ranges at times, carrying heavier weight Relieving factors: general neck mobility exercises  PRECAUTIONS: None  WEIGHT BEARING RESTRICTIONS: No  FALLS:  Has patient fallen in last 6 months? No  LIVING ENVIRONMENT: Lives in: House/apartment  OCCUPATION: Retired  PLOF: Independent, [employer] about 1x/week to 2x/week (has been limited), Rt handed.  Wife has chickens and he helps take care.   PATIENT GOALS:  Loosen up and get stronger, improve balance.     OBJECTIVE:   IMAGING REVIEW: 12/13/2022:  MRI IMPRESSION: 1. Advanced degenerative changes in the cervical spine as detailed above resulting in moderate to severe spinal canal stenosis at C3-C4 and severe spinal canal stenosis at C4-C5. No convincing cord  signal abnormality. 2. Multilevel severe neural foraminal stenosis as detailed above. 3. Marrow edema in the posterior elements on the right at C3-C4 on the left at C4-C5 which may reflect a source of pain.  PATIENT SURVEYS:  01/21/2023:  FOTO update:  70  01/08/2023:  FOTO update:  68  01/01/2023  FOTO update:  61  12/13/2022 FOTO intake:  64  predicted:  71  COGNITION: 12/13/2022 Overall cognitive status: Within functional limits for tasks assessed  SENSATION: 12/13/2022 Light touch assessment equal bilateral for UE  POSTURE:  12/13/2022 Forward head posture, rounded shoulders, mild increase in thoracic kyphosis  PALPATION: 12/13/2022 No specific tenderness to light touch   CERVICAL ROM:   ROM AROM (deg) 12/13/2022 AROM 12/20/2022 AROM 01/03/2023 AROM 01/08/2023 AROM 01/21/2023  Flexion 70 70  71   Extension 58 60 70 70 70  Right lateral flexion       Left lateral flexion       Right rotation 75 73 78 75 80  Left rotation 68 70 74 75 80  12/03/2022:  No speciifc radicular symptoms worsened c movements in AROM cervical.    (Blank rows = not tested)  UPPER EXTREMITY ROM:  ROM Right 12/13/2022 Left 12/13/2022  Shoulder flexion [facility]  Shoulder extension    Shoulder abduction    Shoulder adduction    Shoulder extension    Shoulder internal rotation    Shoulder external rotation    Elbow flexion    Elbow extension    Wrist flexion    Wrist extension    Wrist ulnar deviation    Wrist radial deviation    Wrist pronation    Wrist supination     (Blank rows = not tested)  UPPER EXTREMITY MMT:  MMT Right 12/13/2022 Left 12/13/2022 Left 12/27/2022 Left 01/08/2023 Left 01/21/2023  Shoulder flexion 5/5 4/5 4+/5 4+/5   Shoulder extension       Shoulder abduction 5/5 4/5 4/5 4+/5   Shoulder adduction       Shoulder extension       Shoulder internal rotation 5/5 5/5 5/5 5/5   Shoulder external rotation 5/5 4/5 4+/5 4+/5   Middle trapezius       Lower trapezius        Elbow flexion       Elbow extension       Wrist flexion  Wrist extension       Wrist ulnar deviation       Wrist radial deviation       Wrist pronation       Wrist supination       Grip strength        (Blank rows = not tested)   CERVICAL SPECIAL TESTS:  12/13/2022 No specific testing performed due to not having specific pain in evaluation today.   FUNCTIONAL TESTS:  12/13/2022 No specific testing performed today.    TODAY'S TREATMENT:                                                                                 DATE: 01/21/2023 Therex UBE fwd/back 4 mins each way lvl 3.0 Cervical ROM flexion/extension, Lt and Rt rotation several times each way (review for home) Machine Chest press c SA press bilateral 2 x 15 Machine rows Lt arm only 15 lbs 2 x 15, performed bilaterally  Verbal review of HEP with visual demonstration of select to improve knowledge.     TODAY'S TREATMENT:                                                                                 DATE: 01/08/2023 Therex UBE fwd/back 4 mins each way lvl 3.0 Blue band rows c scapular retraction focus x 20  Blue band GH ext 2-3 sec hold past neutral x 20  Standing bilateral shoulder flexion 90 deg to abduction to side and reverse 1 lb 2 x 10  Seated thoracic extension in chair 5 sec hold x 10  Seated band ER c scapular retraction blue band 2 x 10 , performed bilaterally  Review of HEP for cues.   TODAY'S TREATMENT:                                                                                 DATE: 01/03/2023 Therex UBE fwd/back 4 mins each way c :10 second interval faster at :50-:60 of each minute lvl 3.0 Chest press machine Lt arm only c SA plus press  2 x 15 bilateral 5 lbs Machine rows Lt arm only 15 lbs 2 x 15, performed bilaterally Lat pull down machine 2 x 15 25 lbs  Standing bilateral shoulder flexion 90 deg to abduction to side and reverse 1 lb 2 x 10  Seated thoracic extension in chair 5 sec hold x 10   Seated band ER c scapular retraction green band 2 x 10  Seated cervical rotation towel assist x 10 bilateral  TODAY'S TREATMENT:  DATE: 01/01/2023 Therex Nustep Lvl 5 UE/LE 10 mins  Standing green band ER c towel under arm 2 x 15 c fatigue hold on last rep Standing green band IR c towel under arm 2 x 15 Lt arm Chest press machine Lt arm only c SA plus press  x 15 bilateral 5 lbs Machine rows Lt arm only 15 lbs 2 x 15, performed bilaterally Standing bilateral shoulder flexion 90 deg to abduction to side and reverse 1 lb 2 x 10  Wall push up c serratus plus press 2 x 10     PATIENT EDUCATION:  01/08/2023 Education details: HEP update Person educated: Patient Education method: Consulting civil engineer, Demonstration, Verbal cues, and Handouts Education comprehension: verbalized understanding, returned demonstration, and verbal cues required  HOME EXERCISE PROGRAM: Access Code: [email or web address] URL: [email or web address] Date: 01/08/2023 Prepared by: Scot Jun  Exercises - Shoulder External Rotation and Scapular Retraction  - 3-5 x daily - 7 x weekly - 1 sets - 10 reps - 5 hold - Seated Thoracic Lumbar Extension  - 1-2 x daily - 7 x weekly - 1 sets - 10 reps - Isometric Cervical Extension at Wall with Ball  - 1-2 x daily - 7 x weekly - 1 sets - 5-10 reps - 5 hold - Seated Assisted Cervical Rotation with Towel  - 2-3 x daily - 7 x weekly - 1 sets - 5-10 reps - 5 hold - Shoulder External Rotation with Anchored Resistance  - 2 x daily - 7 x weekly - 3 sets - 10 reps - Shoulder extension with resistance - Neutral  - 2 x daily - 6 x weekly - 2-3 sets - 10 reps - Standing Shoulder Flexion to 90 Degrees with Dumbbells  - 2 x daily - 6 x weekly - 3 sets - 5 reps - Shoulder Abduction with Dumbbells - Thumbs Up  - 2 x daily - 6 x weekly - 3 sets - 5 reps - Single Leg Stance  - 2 x daily - 6 x weekly - 1 sets - 5 reps  - 10 sec hold - Tandem Stance  - 2 x daily - 6 x weekly - 1 sets - 2-3 reps - 30 sec hold  ASSESSMENT:  CLINICAL IMPRESSION: Pt presented today with good report of symptoms and indicated desire to transition to HEP. Agreement from clinician today in plan.  Continued progress was noted in objective data towards goals.  D/C at this time.   OBJECTIVE IMPAIRMENTS: decreased activity tolerance, decreased coordination, decreased mobility, decreased ROM, decreased strength, impaired perceived functional ability, impaired flexibility, impaired UE functional use, improper body mechanics, postural dysfunction, and pain.   ACTIVITY LIMITATIONS: carrying, lifting, and reach over head  PARTICIPATION LIMITATIONS: meal prep, cleaning, interpersonal relationship, community activity, and yard work  PERSONAL FACTORS:  CKD, CAD, GERD, HTN, hypothyroidism, lumbar spinal stenosis, length of time since onset  are also affecting patient's functional outcome.   REHAB POTENTIAL: Good  CLINICAL DECISION MAKING: Stable/uncomplicated  EVALUATION COMPLEXITY: Low   GOALS: Goals reviewed with patient? Yes  SHORT TERM GOALS: (target date for Short term goals are 3 weeks 01/03/2023)  1.Patient will demonstrate independent use of home exercise program to maintain progress from in clinic treatments. Goal status: met  LONG TERM GOALS: (target dates for all long term goals are 12 weeks  03/07/2023 )   1. Patient will demonstrate/report pain at worst less than or equal to 2/10 to facilitate minimal limitation in daily activity secondary to pain symptoms. Goal status:  Met 01/08/2023   2. Patient will demonstrate independent use of home exercise program to facilitate ability to maintain/progress functional gains from skilled physical therapy services. Goal status: Met 01/21/2023   3. Patient will demonstrate FOTO outcome > or = 71 % to indicate reduced disability due to condition. Goal status: mostly met 01/21/2023   4.   Patient will demonstrate cervical AROM WFL s symptoms to facilitate usual head movements for daily activity including driving, self care.   Goal status: mostly met 01/21/2023   5.  Patient will demonstrate Lt shoulder MMT equal to Rt s symptoms for improved lifting, reaching, carrying in daily life.   Goal status: Mostly met 01/21/4023   6.  Patient will demonstrate/report ability to perform usual house/yard activity at PLOF s limitation.  Goal status: met 01/21/2023    PLAN:  PT FREQUENCY:  PT DURATION:  PLANNED INTERVENTIONS: Therapeutic exercises, Therapeutic activity, Neuro Muscular re-education, Balance training, Gait training, Patient/Family education, Joint mobilization, Stair training, DME instructions, Dry Needling, Electrical stimulation, Cryotherapy, vasopneumatic device,Traction, Moist heat, Taping, Ultrasound, Ionotophoresis '4mg'$ /ml Dexamethasone, and Manual therapy.  All included unless contraindicated  PLAN FOR NEXT SESSION: Discharge to [facility], PT, DPT, OCS, ATC 01/21/23  2:33 PM      Referring diagnosis? M54.12 (ICD-10-CM) - Radiculopathy, cervical region Treatment diagnosis? (if different than referring diagnosis) M54.2 What was this (referring dx) caused by? '[]'$  Surgery '[]'$  Fall '[x]'$  Ongoing issue '[]'$  Arthritis '[]'$  Other: ____________  Laterality: '[]'$  Rt '[x]'$  Lt '[]'$  Both  Check all possible CPT codes:  *CHOOSE 10 OR LESS*    '[]'$  97110 (Therapeutic Exercise)  '[]'$  92507 (SLP Treatment)  '[]'$  97112 (Neuro Re-ed)   '[]'$  92526 (Swallowing Treatment)   '[]'$  97116 (Gait Training)   '[]'$  [id number] (Cognitive Training, 1st 15 minutes) '[]'$  97140 (Manual Therapy)   '[]'$  97130 (Cognitive Training, each add'l 15 minutes)  '[]'$  97164 (Re-evaluation)                              '[]'$  Other, List CPT Code ____________  '[]'$  97530 (Therapeutic Activities)     '[]'$  97535 (Self Care)   '[x]'$  All codes above (97110 - 97535)  '[x]'$  97012 (Mechanical Traction)  '[]'$  60454 (E-stim Unattended)  '[x]'$   97032 (E-stim manual)  '[]'$  09811 (Ionto)  '[]'$  97035 (Ultrasound) '[x]'$  97750 (Physical Performance Training) '[]'$  [id number] (Aquatic Therapy) '[]'$  97016 (Vasopneumatic Device) '[]'$  [id number] (Paraffin) '[]'$  97034 (Contrast Bath) '[]'$  97597 (Wound Care 1st 20 sq cm) '[]'$  97598 (Wound Care each add'l 20 sq cm) '[]'$  97760 (Orthotic Fabrication, Fitting, Training Initial) '[]'$  [id number] (Prosthetic Management and Training Initial) '[]'$  [id number] (Orthotic or Prosthetic Training/ Modification Subsequent)

## 2023-03-18 ENCOUNTER — Ambulatory Visit: Payer: Medicare HMO | Admitting: Orthopedic Surgery

## 2023-03-18 ENCOUNTER — Encounter: Payer: Self-pay | Admitting: Orthopedic Surgery

## 2023-03-18 VITALS — BP 125/58 | HR 67 | Ht 71.0 in | Wt 202.0 lb

## 2023-03-18 DIAGNOSIS — M5412 Radiculopathy, cervical region: Secondary | ICD-10-CM | POA: Diagnosis not present

## 2023-03-18 MED ORDER — GABAPENTIN 300 MG PO CAPS: 300.0000 mg | ORAL_CAPSULE | Freq: Three times a day (TID) | ORAL | 0 refills | Status: DC

## 2023-03-18 NOTE — Progress Notes (Signed)
.Orthopedic Spine Surgery Office Note  Assessment: Patient is a 72 y.o. male with left shoulder weakness.  Cervical MRI shows multiple levels of foraminal stenosis but given the distribution of his weakness, suspect the C4 and C5 nerves are involved    Plan: -Patient has tried PT, activity modification, home strengthening -He does not have any signs/symptoms of myelopathy so explained we can continue to monitor. Surgery was presented as an option for his arm weakness but he would like to continue to monitor for now Prescribed gabapentin to try for his legs. Explained that he may have tandem stenosis which is causing his leg tiredness -Patient should return to office in 8 weeks, x-rays at next visit: AP/lateral/flex/ex lumbar   Patient expressed understanding of the plan and all questions were answered to the patient's satisfaction.   __________________________________________________________________________  History: Patient is a 72 y.o. male who has been previously seen in the office for symptoms of deltoid weakness on the left side. He does not feel he has made any improvement in his strength on the left side. Has continued with therapy and home exercise since the last time I saw him. He is not having any pain radiating into either upper extremity. Has not noticed any trouble with fine motor skills in the hand or unsteadiness with gait. He does not that he has tiredness in his legs with walking or if he stands for too long. Has noticed that he is sitting more these days because of the tiredness he feels in his legs. States he used to be able to do over 10k steps in a day but now is doing 5 or 6k. Denies paresthesias and numbness. No pain radiating into the legs.    Previous treatments: PT, activity modification, home strengthening   Physical Exam:  General: no acute distress, appears stated age Neurologic: alert, answering questions appropriately, following commands Respiratory: unlabored  breathing on room air, symmetric chest rise Psychiatric: appropriate affect, normal cadence to speech   MSK (spine):   -Strength exam                                                   Left                  Right Grip strength                5/5                  5/5 Interosseus                  5/5                  5/5 Wrist extension            5/5                  5/5 Wrist flexion                 5/5                  5/5 Elbow flexion                5/5                  5/5 Deltoid  4/5                  5/5   EHL                              4/5                  4/5 TA                                 5/5                  5/5 GSC                             5/5                  5/5 Knee extension            5/5                  5/5 Hip flexion                    5/5                  5/5   -Sensory exam                           Sensation intact to light touch in L3-S1 nerve distributions of bilateral lower extremities             Sensation intact to light touch in C5-T1 nerve distributions of bilateral upper extremities   -Brachioradialis DTR: 1/4 on the left, 1/4 on the right -Biceps DTR: 1/4 on the left, 1/4 on the right -Achilles DTR: 1/4 on the left, 1/4 on the right -Patellar tendon DTR: 2/4 on the left, 2/4 on the right   -Spurling: Negative bilaterally -Hoffman sign: Negative bilaterally -Clonus: No beats bilaterally -Interosseous wasting: None seen -Grip and release test: Negative -Romberg: Negative -Gait: Normal -Imbalance with tandem gait: No   Left shoulder exam: No pain through range of motion Right shoulder exam: No pain through range of motion   Imaging: XR of the cervical spine from 12/05/2022 and 12/202/2023 was previously independently reviewed and interpreted, showing disc height loss at C3/4, C4/5, C5/6, C6/7. Lordotic alignment. T1 slope of 33 degrees. C2 slope of 12 degrees. Spondylolisthesis at C2/3 measures 2.46mm on flexion  and reduces on extension.   MRI of the cervical spine from 12/04/2022 was previously independently reviewed and interpreted, showing central and foraminal stenosis bilaterally at C3/4. Central and foraminal stenosis bilaterally at C4/5. Bilateral foraminal stenosis at C5/6. Right sided foraminal stenosis at C6/7. DDD at C3/4, C4/5, C5/6, C6/7.     Patient name: Dustin [name] Patient MRN: [id number] Date of visit: 03/18/23

## 2023-05-20 ENCOUNTER — Ambulatory Visit: Payer: Medicare HMO | Admitting: Orthopedic Surgery

## 2023-05-20 ENCOUNTER — Other Ambulatory Visit (INDEPENDENT_AMBULATORY_CARE_PROVIDER_SITE_OTHER): Payer: Medicare HMO

## 2023-05-20 DIAGNOSIS — M5416 Radiculopathy, lumbar region: Secondary | ICD-10-CM

## 2023-05-20 MED ORDER — PREGABALIN 75 MG PO CAPS: 75.0000 mg | ORAL_CAPSULE | Freq: Two times a day (BID) | ORAL | 0 refills | Status: DC

## 2023-05-20 NOTE — Progress Notes (Signed)
Orthopedic Spine Surgery Office Note   Assessment: Patient is a 72 y.o. male with low back pain and tired feeling in his legs bilaterally that improves with flexion, possible neurogenic claudication.  Still has left proximal arm weakness likely coming from C4 and C5 foraminal stenosis but it has not progressed     Plan: -Patient has tried PT, activity modification, home strengthening, gabapentin -He does not have any signs/symptoms of myelopathy so explained we can continue to monitor.  Will continue to monitor the weakness in his left arm.  It has not progressed so far -He has not tried conservative treatment for over 6 weeks for his symptoms of neurogenic claudication without any improvement, so recommended MRI to evaluate further -Prescribed Lyrica to try for his symptoms -Patient should return to office in 4 weeks, x-rays at next visit: none     Patient expressed understanding of the plan and all questions were answered to the patient's satisfaction.    __________________________________________________________________________   History: Patient is a 72 y.o. male who comes in today to discuss his lumbar spine.  He states he has low back pain and develops a tired sensation in his legs.  He states that he can only walk now for 2 minutes or so before he has to sit down because his legs feel too tired.  He does note that leaning forward helps with this sensation.  Denies paresthesias and numbness.  He does not have pain radiating into his legs.   Of note, he is still not having pain radiating into either upper extremity.  He has not noticed any new weakness in his left arm.  He has not noticed any unsteadiness with gait or trouble with fine motor skills in his hands.   Previous treatments: PT, activity modification, home strengthening, gabapentin     Physical Exam:   General: no acute distress, appears stated age Neurologic: alert, answering questions appropriately, following  commands Respiratory: unlabored breathing on room air, symmetric chest rise Psychiatric: appropriate affect, normal cadence to speech     MSK (spine):   -Strength exam                                                   Left                  Right Grip strength                5/5                  5/5 Interosseus                  5/5                  5/5 Wrist extension            5/5                  5/5 Wrist flexion                 5/5                  5/5 Elbow flexion                4+/5  5/5 Deltoid                          4/5                  5/5   EHL                              4/5                  4/5 TA                                 5/5                  5/5 GSC                             5/5                  5/5 Knee extension            5/5                  5/5 Hip flexion                    5/5                  5/5   -Sensory exam                           Sensation intact to light touch in L3-S1 nerve distributions of bilateral lower extremities             Sensation intact to light touch in C5-T1 nerve distributions of bilateral upper extremities    -Hoffman sign: Negative bilaterally -Clonus: No beats bilaterally -Interosseous wasting: None seen -Grip and release test: Negative -Gait: Normal   Imaging: XR of the lumbar spine from 05/20/2023 was independently reviewed and interpreted, showing spondylolisthesis at L4/5 with significant loss of disc height.  Posterior instrumentation at L4 and L5 with no lucency around the screws.  Screws not backed out.  No evidence of instability on flexion/extension views.  No fracture or dislocation seen.  No significant disc height loss at other levels.       Patient name: Dustin [name] Patient MRN: [id number] Date of visit: 05/20/23

## 2023-05-30 ENCOUNTER — Encounter: Payer: Self-pay | Admitting: Orthopedic Surgery

## 2023-05-31 ENCOUNTER — Encounter: Payer: Self-pay | Admitting: Orthopedic Surgery

## 2023-05-31 ENCOUNTER — Ambulatory Visit
Admission: RE | Admit: 2023-05-31 | Discharge: 2023-05-31 | Disposition: A | Discharge status: Home / Self Care | Payer: Medicare HMO | Source: Ambulatory Visit | Attending: Orthopedic Surgery | Admitting: Orthopedic Surgery

## 2023-05-31 ENCOUNTER — Encounter: Payer: Self-pay | Admitting: Orthopaedic Surgery

## 2023-05-31 DIAGNOSIS — M5416 Radiculopathy, lumbar region: Secondary | ICD-10-CM

## 2023-06-17 ENCOUNTER — Ambulatory Visit: Payer: Medicare HMO | Admitting: Orthopedic Surgery

## 2023-06-17 DIAGNOSIS — M5416 Radiculopathy, lumbar region: Secondary | ICD-10-CM | POA: Diagnosis not present

## 2023-06-17 NOTE — Progress Notes (Addendum)
Orthopedic Spine Surgery Office Note   Assessment: Patient is a 72 y.o. male with low back pain and tired feeling in his legs bilaterally.  Has lateral recess and bilateral foraminal stenosis at L3/4.  In regards to his cervical spine, he still has weakness with the proximal musculature on the left side.  He has not developed any symptoms of myelopathy and he is not having any radiating pain into either upper extremity.  Weakness likely coming from C4 and C5 foraminal stenosis     Plan: -Patient has tried PT, activity modification, home strengthening, gabapentin, Lyrica -Since his left arm strength has improved since initial visit and he has not developed any symptoms of myelopathy, we will continue to monitor him for myelopathy -In regards to his lumbar spine, recommended diagnostic/therapeutic injection at L3/4.  He can continue with the Lyrica to see if that helps since he has noted some difference albeit mild -Patient should return to office in 3 months, x-rays at next visit: none     Patient expressed understanding of the plan and all questions were answered to the patient's satisfaction.    __________________________________________________________________________   History: Patient is a 72 y.o. male who comes in today to follow-up on his lumbar and cervical spine.  In regards to his lumbar spine, he feels that the Lyrica may be mildly helpful.  He said he was getting give it another month to see if he notices a significant difference.  He is still having low back pain and the feeling of tiredness in his legs bilaterally.  He feels it throughout the leg including past the knee. He feels pain going into the bilateral legs. He says he can only walk a couple minutes before the symptoms developed.  He says he notices the symptoms do resolve if he sits down or leans forward.  We also talked about his cervical spine today.  He has not noticed any imbalance or difficulty with fine motor skills in  the hands.  He does describe the sensation of having to be more thoughtful with walking.  He still has weakness in his shoulder and the left side but it has improved from when I initially saw him.  He is not having any pain radiating into either upper extremity.  He is ambulating without any assistive devices.   Pain is rated as a 8/10 on VAS scale   Previous treatments: PT, activity modification, home strengthening, gabapentin, Lyrica     Physical Exam:   General: no acute distress, appears stated age Neurologic: alert, answering questions appropriately, following commands Respiratory: unlabored breathing on room air, symmetric chest rise Psychiatric: appropriate affect, normal cadence to speech     MSK (spine):   -Strength exam                                                   Left                  Right Grip strength                5/5                  5/5 Interosseus                  5/5  5/5 Wrist extension            5/5                  5/5 Wrist flexion                 5/5                  5/5 Elbow flexion                4+/5                5/5 Deltoid                          4/5                  5/5   EHL                              4/5                  4/5 TA                                 5/5                  5/5 GSC                             5/5                  5/5 Knee extension            5/5                  5/5 Hip flexion                    5/5                  5/5   -Sensory exam                           Sensation intact to light touch in L3-S1 nerve distributions of bilateral lower extremities             Sensation intact to light touch in C5-T1 nerve distributions of bilateral upper extremities     -Hoffman sign: Negative bilaterally -Clonus: No beats bilaterally -Interosseous wasting: None seen -Grip and release test: Negative -Gait: Normal  -Brachioradialis DTR: 1/4 on the left, 1/4 on the right -Biceps DTR: 1/4 on the left,  1/4 on the right -Achilles DTR: 1/4 on the left, 1/4 on the right -Patellar tendon DTR: 2/4 on the left, 2/4 on the right   Imaging: XR of the lumbar spine from 05/20/2023 was previously independently reviewed and interpreted, showing spondylolisthesis at L4/5 with significant loss of disc height.  Posterior instrumentation at L4 and L5 with no lucency around the screws.  Screws not backed out.  No evidence of instability on flexion/extension views.  No fracture or dislocation seen.  No significant disc height loss at other levels.   MRI of the lumbar spine from 05/31/2023 was independently reviewed and interpreted, showing no spondylolisthesis at L4/5 with significant DDD.  Foraminal stenosis is seen  bilaterally at L4/5.  There is DDD with bilateral lateral recess stenosis and foraminal stenosis at L3/4.    Patient name: Dustin [name] Patient MRN: [id number] Date of visit: 06/17/23

## 2023-07-24 ENCOUNTER — Telehealth: Payer: Self-pay

## 2023-07-24 NOTE — Telephone Encounter (Signed)
Patient's wife called in asking the status of the injection. Please advise as it is no longer in the workqueue

## 2023-07-25 ENCOUNTER — Other Ambulatory Visit: Payer: Self-pay | Admitting: Radiology

## 2023-07-25 DIAGNOSIS — M5416 Radiculopathy, lumbar region: Secondary | ICD-10-CM

## 2023-07-25 DIAGNOSIS — M48062 Spinal stenosis, lumbar region with neurogenic claudication: Secondary | ICD-10-CM

## 2023-07-25 NOTE — Telephone Encounter (Signed)
I called and advised injection was denied due to not having PT on his back, I have placed an order for PT of his lower back

## 2023-08-01 ENCOUNTER — Encounter: Payer: Self-pay | Admitting: Physical Therapy

## 2023-08-01 ENCOUNTER — Other Ambulatory Visit: Payer: Self-pay

## 2023-08-01 ENCOUNTER — Ambulatory Visit: Payer: Medicare HMO | Admitting: Physical Therapy

## 2023-08-01 DIAGNOSIS — R29898 Other symptoms and signs involving the musculoskeletal system: Secondary | ICD-10-CM | POA: Diagnosis not present

## 2023-08-01 DIAGNOSIS — M546 Pain in thoracic spine: Secondary | ICD-10-CM | POA: Diagnosis not present

## 2023-08-01 DIAGNOSIS — R293 Abnormal posture: Secondary | ICD-10-CM

## 2023-08-01 DIAGNOSIS — M5459 Other low back pain: Secondary | ICD-10-CM

## 2023-08-01 NOTE — Therapy (Signed)
OUTPATIENT PHYSICAL THERAPY THORACOLUMBAR EVALUATION   Patient Name: Dustin [name] MRN: [id number] DOB:1951/02/11, 72 y.o., male Today's Date: 08/01/2023  END OF SESSION:  PT End of Session - 08/01/23 1157     Visit Number 1    Number of Visits 13    Date for PT Re-Evaluation 09/12/23    Authorization Type [employer]    Authorization Time Period 08/01/23 to 09/12/23    Progress Note Due on Visit 10    PT Start Time 1018    PT Stop Time 1057    PT Time Calculation (min) 39 min    Activity Tolerance Patient tolerated treatment well    Behavior During Therapy [facility] for tasks assessed/performed             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   Hypertension    Hypothyroidism 07/01/2018   Paroxysmal A-fib (HCC)    S/P mitral valve repair 07/08/2017   Spinal stenosis of lumbar region 08/22/2021   Past Surgical History:  Procedure Laterality Date   CARDIAC CATHETERIZATION  05/14/2017   hx ablation for A-flutter  11/01/2017   MITRAL VALVE REPAIR (MV)/CORONARY ARTERY BYPASS GRAFTING (CABG)  07/08/2017   SVG-OM, LAA closure, MV reconstruction with annuloplasty and sliding plasty via superior septal approach/Medtronic Simplici-T Annuloplasty System, left GSV harvest 07/08/17 by Duanne Limerick, MD at [facility]   TONSILLECTOMY     removed as a child   Patient Active Problem List   Diagnosis Date Noted   S/P lumbar fusion 12/01/2021   Lumbar stenosis 11/29/2021   S/P mitral valve repair 07/01/2018   OSA (obstructive sleep apnea) 07/01/2018   GERD (gastroesophageal reflux disease) 07/01/2018   Hyperlipidemia 07/01/2018   Erectile dysfunction 07/01/2018   Hypothyroid 07/01/2018    PCP: Selina Cooley MD   REFERRING PROVIDER: London Sheer, MD  REFERRING DIAG: M54.16 (ICD-10-CM) - Radiculopathy, lumbar  region M54.16 (ICD-10-CM) - Lumbar radiculopathy M48.062 (ICD-10-CM) - Spinal stenosis of lumbar region with neurogenic claudication  Rationale for Evaluation and Treatment: Rehabilitation  THERAPY DIAG:  Other low back pain  Pain in thoracic spine  Abnormal posture  Other symptoms and signs involving the musculoskeletal system  ONSET DATE: chronic   SUBJECTIVE:  SUBJECTIVE STATEMENT:  I've had back surgery in the past, I have "fusion bars" at the bottom, they also cleaned out stenosis and fused those 2 vertebrae together. The surgeon told me I still have some issues in my back like DDD and OA, Dr. Otelia Sergeant said "your back is a wreck". Came here for neck issues in the past. My pain now is right in the middle of my back, as the day progresses my back fatigues (points to R mid-thoracic area approximately T6-T9). Bending over and stretching that area can help, stretching out flat in supine can really help too. Ibuprofen helps but MD doesn't want me taking it anymore due to kidney health.   PERTINENT HISTORY:  CKD, CAD, HTN, hypothyroidism, Afib, hx mitral valve repair, lumbar spinal stenosis, hx ablation due to aflutter, CABG   PAIN:  Are you having pain? No 0/10 now, can get to 6-7/10 at worst when standing or walking.   PRECAUTIONS: None  RED FLAGS: None   WEIGHT BEARING RESTRICTIONS: No  FALLS:  Has patient fallen in last 6 months? No  LIVING ENVIRONMENT: Lives with: lives with their spouse Lives in: House/apartment Stairs: steps to basement and up to second floor, also 4 STE home  Has following equipment at home: Single point cane and Environmental consultant - 2 wheeled  OCCUPATION: Retired- used to Medical illustrator   PLOF: Independent, Independent with basic ADLs, Independent with gait,  and Independent with transfers  PATIENT GOALS: be able to get a shot (insurance requiring PT prior to), get back feeling better   NEXT MD VISIT: Referring 09/18/23  OBJECTIVE:   DIAGNOSTIC FINDINGS:  XR of the lumbar spine from 05/20/2023 was independently reviewed and  interpreted, showing spondylolisthesis at L4/5 with significant loss of  disc height.  Posterior instrumentation at L4 and L5 with no lucency  around the screws.  Screws not backed out.  No evidence of instability on  flexion/extension views.  No fracture or dislocation seen.  No significant  disc height loss at other levels.  CLINICAL DATA:  Lumbar radiculopathy with symptoms persisting over 6 weeks of treatment. Low back pain for 9 months.   EXAM: MRI LUMBAR SPINE WITHOUT CONTRAST   TECHNIQUE: Multiplanar, multisequence MR imaging of the lumbar spine was performed. No intravenous contrast was administered.   COMPARISON:  11/28/2020   FINDINGS: Segmentation:  Standard.   Alignment:  Fused anterolisthesis at L4-5 measuring 1 cm.   Vertebrae:  No fracture, evidence of discitis, or bone lesion.   Conus medullaris and cauda equina: Conus extends to the L1 level. Conus and cauda equina appear normal.   Paraspinal and other soft tissues: Expected postoperative scarring at the posterior soft tissues of L4-5.   Disc levels:   T11-12: Disc collapse and endplate degeneration without impingement or change.   T12- L1: Unremarkable.   L1-L2: Unremarkable.   L2-L3: Disc desiccation and narrowing with circumferential bulge. Bilateral inferior foraminal protrusion.   L3-L4: Disc narrowing and circumferential bulging. Facet spurring and ligamentum flavum thickening that is progressed. Moderate left more than right foraminal narrowing.   L4-L5: Interval posterior decompression with improved thecal sac patency although there is still mild to moderate narrowing of the thecal sac. Disc height loss and bulging  causes moderate foraminal stenosis asymmetric to the left. The right foramina is patent   L5-S1:Unremarkable.   IMPRESSION: 1. Interval L4-5 decompression and fusion. Improved thecal sac patency with mild-to-moderate residual narrowing primarily from listhesis. Foraminal patency is also improved. 2. 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      PATIENT SURVEYS:  FOTO 51, predicted 55 in 12 visits     COGNITION: Overall cognitive status: Within functional limits for tasks assessed     SENSATION: Not tested  MUSCLE LENGTH:  HS mild limitation B Piriformis mild limitation B   POSTURE: rounded shoulders, forward head, increased thoracic kyphosis, and flexed trunk   PALPATION: Severe multiple mm spasms and trigger points noted B thoracic paraspinals, more severe on R side than L   LUMBAR ROM:   AROM eval  Flexion WNL  Extension Moderate limitation, compensates from hips   Right lateral flexion Mild limitation  Left lateral flexion Mild limitation   Right rotation   Left rotation    (Blank rows = not tested)  Thoracic ROM: Flexion WNL, extension WNL, lateral flexion WNL B, rotation WNL B    LOWER EXTREMITY MMT:    MMT Right eval Left eval  Hip flexion 4+ 4+  Hip extension    Hip abduction 4+ 4+  Hip adduction    Hip internal rotation    Hip external rotation    Knee flexion 4+ 4+  Knee extension 4+ 4+  Ankle dorsiflexion 4+ 4+  Ankle plantarflexion    Ankle inversion    Ankle eversion     (Blank rows = not tested)    TODAY'S TREATMENT:                                                                                                                              DATE:   Eval 08/01/23  Objective measures, care planning, education as below  TherEX  PPT 10x3 second holds Thoracic extension stretch over towel roll 2x30-60 seconds Scap retraction green TB 10x3 second holds Tennis ball  massage to R thoracic paraspinals on wall      PATIENT EDUCATION:  Education details: exam findings, POC, HEP  Person educated: Patient Education method: Programmer, multimedia, Demonstration, and Handouts Education comprehension: verbalized understanding, returned demonstration, and needs further education  HOME EXERCISE PROGRAM: Access Code: [email or web address] + tennis ball self-massage on wall to T-spine  URL: [email or web address] Date: 08/01/2023 Prepared by: Nedra Hai  Exercises - Supine Posterior Pelvic Tilt  - 2 x daily - 7 x weekly - 1 sets - 10 reps - 3 seconds  hold - Thoracic Extension Mobilization on Foam Roll  - 2 x daily - 7 x weekly - 1 sets - 2-3 reps - 30-60 seconds  hold - Scapular Retraction with Resistance  - 2 x daily - 7 x weekly - 1 sets - 10 reps - 2 seconds  hold  ASSESSMENT:  CLINICAL IMPRESSION: Patient is a 72 y.o. M who was seen today for physical therapy evaluation and treatment for M54.16 (ICD-10-CM) - Radiculopathy, lumbar region M54.16 (ICD-10-CM) - Lumbar radiculopathy M48.062 (ICD-10-CM) - Spinal stenosis of lumbar region with neurogenic claudication. Per his description, his pain actually appears  to be in R mid-low thoracic spine above level of prior lumbar surgeries. See objective findings as able, T-spine is definitely tight and he does have significant postural impairments with paraspinal and core mm weakness, also severe mm trigger points and spasms in identified area of pain and fatigue. Will benefit from course of care focusing on general core/lumbar/thoracic/postural mm strengthening, work to address soft tissue limitations, and to develop long term exercise program for effective pain management moving forward.   OBJECTIVE IMPAIRMENTS: decreased ROM, decreased strength, hypomobility, increased fascial restrictions, increased muscle spasms, impaired flexibility, improper body mechanics, postural dysfunction, and pain.   ACTIVITY LIMITATIONS:  carrying, lifting, standing, sleeping, and locomotion level  PARTICIPATION LIMITATIONS: meal prep, cleaning, laundry, driving, shopping, community activity, and yard work  PERSONAL FACTORS: Age, Behavior pattern, Education, Fitness, Past/current experiences, Social background, and Time since onset of injury/illness/exacerbation are also affecting patient's functional outcome.   REHAB POTENTIAL: Good  CLINICAL DECISION MAKING: Stable/uncomplicated  EVALUATION COMPLEXITY: Low   GOALS: Goals reviewed with patient? Yes  SHORT TERM GOALS: Target date: 08/15/2023    Will be compliant with appropriate progressive HEP  Baseline: Goal status: INITIAL  2.  Will demonstrate improved functional posture with ADLs and functional task performance  Baseline:  Goal status: INITIAL  LONG TERM GOALS: Target date: 09/12/2023    Muscle spasms/trigger points to have improved by at least 50% in severity  Baseline:  Goal status: INITIAL  2.  Pain to be no more than 2-3/10 at worst  Baseline:  Goal status: INITIAL  3.  Will be able to perform all desired gym activities and home care activities with pain/fatigue in his back no more than 2-3/10 Baseline:  Goal status: INITIAL  4.  FOTO score to have met at least predicted level by time of DC  Baseline:  Goal status: INITIAL    PLAN:  PT FREQUENCY: 2x/week  PT DURATION: 6 weeks  PLANNED INTERVENTIONS: Therapeutic exercises, Therapeutic activity, Neuromuscular re-education, Gait training, Patient/Family education, Self Care, Manual therapy, and Re-evaluation.  PLAN FOR NEXT SESSION: core and postural strength, consider thoracic joint mobs, manual and DN as desired, biomechanics training   Nedra Hai, PT, DPT 08/01/23 11:59 AM    Referring diagnosis? M54.16 (ICD-10-CM) - Radiculopathy, lumbar region M54.16 (ICD-10-CM) - Lumbar radiculopathy M48.062 (ICD-10-CM) - Spinal stenosis of lumbar region with neurogenic  claudication Treatment diagnosis? (if different than referring diagnosis) M54.59, M54.6, R29.3, R29.898 What was this (referring dx) caused by? []  Surgery []  Fall [x]  Ongoing issue []  Arthritis []  Other: ____________  Laterality: [x]  Rt []  Lt []  Both  Check all possible CPT codes:  *CHOOSE 10 OR LESS*    []  97110 (Therapeutic Exercise)  []  92507 (SLP Treatment)  []  97112 (Neuro Re-ed)   []  92526 (Swallowing Treatment)   []  97116 (Gait Training)   []  [id number] (Cognitive Training, 1st 15 minutes) []  97140 (Manual Therapy)   []  97130 (Cognitive Training, each add'l 15 minutes)  []  97164 (Re-evaluation)                              []  Other, List CPT Code ____________  []  97530 (Therapeutic Activities)     []  40981 (Self Care)   [x]  All codes above (97110 - 97535)  []  97012 (Mechanical Traction)  []  97014 (E-stim Unattended)  []  97032 (E-stim manual)  []  97033 (Ionto)  []  97035 (Ultrasound) []  97750 (Physical Performance Training) []  [id number] (Aquatic Therapy) []   47829 (Vasopneumatic Device) []  [id number] (Paraffin) []  97034 (Contrast Bath) []  [phone] (Wound Care 1st 20 sq cm) []  97598 (Wound Care each add'l 20 sq cm) []  97760 (Orthotic Fabrication, Fitting, Training Initial) []  [id number] (Prosthetic Management and Training Initial) []  08657 (Orthotic or Prosthetic Training/ Modification Subsequent)

## 2023-08-07 ENCOUNTER — Ambulatory Visit: Payer: Medicare HMO | Admitting: Physical Therapy

## 2023-08-07 ENCOUNTER — Encounter: Payer: Self-pay | Admitting: Physical Therapy

## 2023-08-07 DIAGNOSIS — R293 Abnormal posture: Secondary | ICD-10-CM

## 2023-08-07 DIAGNOSIS — M5459 Other low back pain: Secondary | ICD-10-CM

## 2023-08-07 DIAGNOSIS — R29898 Other symptoms and signs involving the musculoskeletal system: Secondary | ICD-10-CM

## 2023-08-07 DIAGNOSIS — M546 Pain in thoracic spine: Secondary | ICD-10-CM | POA: Diagnosis not present

## 2023-08-07 NOTE — Therapy (Signed)
OUTPATIENT PHYSICAL THERAPY THORACOLUMBAR TREATMENT   Patient Name: Dustin [name] MRN: [id number] DOB:[birth year], 72 y.o., male Today's Date: 08/07/2023  END OF SESSION:  PT End of Session - 08/07/23 1117     Visit Number 2    Number of Visits 13    Date for PT Re-Evaluation 09/12/23    Authorization Type [employer]    Authorization Time Period 08/01/23 to 09/12/23    Progress Note Due on Visit 10    PT Start Time 1115    PT Stop Time 1155    PT Time Calculation (min) 40 min    Activity Tolerance Patient tolerated treatment well    Behavior During Therapy [facility] for tasks assessed/performed              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   Hypertension    Hypothyroidism 07/01/2018   Paroxysmal A-fib (HCC)    S/P mitral valve repair 07/08/2017   Spinal stenosis of lumbar region 08/22/2021   Past Surgical History:  Procedure Laterality Date   CARDIAC CATHETERIZATION  05/14/2017   hx ablation for A-flutter  11/01/2017   MITRAL VALVE REPAIR (MV)/CORONARY ARTERY BYPASS GRAFTING (CABG)  07/08/2017   SVG-OM, LAA closure, MV reconstruction with annuloplasty and sliding plasty via superior septal approach/Medtronic Simplici-T Annuloplasty System, left GSV harvest 07/08/17 by Duanne Limerick, MD at [facility]   TONSILLECTOMY     removed as a child   Patient Active Problem List   Diagnosis Date Noted   S/P lumbar fusion 12/01/2021   Lumbar stenosis 11/29/2021   S/P mitral valve repair 07/01/2018   OSA (obstructive sleep apnea) 07/01/2018   GERD (gastroesophageal reflux disease) 07/01/2018   Hyperlipidemia 07/01/2018   Erectile dysfunction 07/01/2018   Hypothyroid 07/01/2018    PCP: Selina Cooley MD   REFERRING PROVIDER: London Sheer, MD  REFERRING DIAG: M54.16 (ICD-10-CM) - Radiculopathy, lumbar  region M54.16 (ICD-10-CM) - Lumbar radiculopathy M48.062 (ICD-10-CM) - Spinal stenosis of lumbar region with neurogenic claudication  Rationale for Evaluation and Treatment: Rehabilitation  THERAPY DIAG:  Other low back pain  Pain in thoracic spine  Abnormal posture  Other symptoms and signs involving the musculoskeletal system  ONSET DATE: chronic   SUBJECTIVE:  SUBJECTIVE STATEMENT:  I had to stop doing the extension over the towel roll because it made my low back sting. I've been doing the others and they're OK.   PERTINENT HISTORY:  CKD, CAD, HTN, hypothyroidism, Afib, hx mitral valve repair, lumbar spinal stenosis, hx ablation due to aflutter, CABG   PAIN:  Are you having pain? Yes: NPRS scale: 5/10 Pain location: R low thoracic paraspinals  Pain description: OA pain, "its about normal"  Aggravating factors: nothing  Relieving factors: stretches, getting in recliner and stretching back with UEs overhead      PRECAUTIONS: None  RED FLAGS: None   WEIGHT BEARING RESTRICTIONS: No  FALLS:  Has patient fallen in last 6 months? No  LIVING ENVIRONMENT: Lives with: lives with their spouse Lives in: House/apartment Stairs: steps to basement and up to second floor, also 4 STE home  Has following equipment at home: Single point cane and Environmental consultant - 2 wheeled  OCCUPATION: Retired- used to Medical illustrator   PLOF: Independent, Independent with basic ADLs, Independent with gait, and Independent with transfers  PATIENT GOALS: be able to get a shot (insurance requiring PT prior to), get back feeling better   NEXT MD VISIT: Referring 09/18/23  OBJECTIVE:   DIAGNOSTIC FINDINGS:  XR of the lumbar spine from 05/20/2023 was independently reviewed and  interpreted, showing  spondylolisthesis at L4/5 with significant loss of  disc height.  Posterior instrumentation at L4 and L5 with no lucency  around the screws.  Screws not backed out.  No evidence of instability on  flexion/extension views.  No fracture or dislocation seen.  No significant  disc height loss at other levels.  CLINICAL DATA:  Lumbar radiculopathy with symptoms persisting over 6 weeks of treatment. Low back pain for 9 months.   EXAM: MRI LUMBAR SPINE WITHOUT CONTRAST   TECHNIQUE: Multiplanar, multisequence MR imaging of the lumbar spine was performed. No intravenous contrast was administered.   COMPARISON:  11/28/2020   FINDINGS: Segmentation:  Standard.   Alignment:  Fused anterolisthesis at L4-5 measuring 1 cm.   Vertebrae:  No fracture, evidence of discitis, or bone lesion.   Conus medullaris and cauda equina: Conus extends to the L1 level. Conus and cauda equina appear normal.   Paraspinal and other soft tissues: Expected postoperative scarring at the posterior soft tissues of L4-5.   Disc levels:   T11-12: Disc collapse and endplate degeneration without impingement or change.   T12- L1: Unremarkable.   L1-L2: Unremarkable.   L2-L3: Disc desiccation and narrowing with circumferential bulge. Bilateral inferior foraminal protrusion.   L3-L4: Disc narrowing and circumferential bulging. Facet spurring and ligamentum flavum thickening that is progressed. Moderate left more than right foraminal narrowing.   L4-L5: Interval posterior decompression with improved thecal sac patency although there is still mild to moderate narrowing of the thecal sac. Disc height loss and bulging causes moderate foraminal stenosis asymmetric to the left. The right foramina is patent   L5-S1:Unremarkable.   IMPRESSION: 1. Interval L4-5 decompression and fusion. Improved thecal sac patency with mild-to-moderate residual narrowing primarily from listhesis. Foraminal patency is also  improved. 2. 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      PATIENT SURVEYS:  FOTO 51, predicted 55 in 12 visits     COGNITION: Overall cognitive status: Within functional limits for tasks assessed     SENSATION: Not tested  MUSCLE LENGTH:  HS mild limitation B Piriformis mild limitation B   POSTURE:  rounded shoulders, forward head, increased thoracic kyphosis, and flexed trunk   PALPATION: Severe multiple mm spasms and trigger points noted B thoracic paraspinals, more severe on R side than L   LUMBAR ROM:   AROM eval  Flexion WNL  Extension Moderate limitation, compensates from hips   Right lateral flexion Mild limitation  Left lateral flexion Mild limitation   Right rotation   Left rotation    (Blank rows = not tested)  Thoracic ROM: Flexion WNL, extension WNL, lateral flexion WNL B, rotation WNL B    LOWER EXTREMITY MMT:    MMT Right eval Left eval  Hip flexion 4+ 4+  Hip extension    Hip abduction 4+ 4+  Hip adduction    Hip internal rotation    Hip external rotation    Knee flexion 4+ 4+  Knee extension 4+ 4+  Ankle dorsiflexion 4+ 4+  Ankle plantarflexion    Ankle inversion    Ankle eversion     (Blank rows = not tested)    TODAY'S TREATMENT:                                                                                                                              DATE:   08/07/23  TherEx  UBE L6x6 minutes backwards only  Thoracic flexion/rotation stretch x10 B Thoracic extension/rotation stretch x10 B  Scapular retractions green TB 15x3 second holds  Shoulder extensions green TB 15x3 second holds PPT 15x3 second holds PPT + march x12 Lumbar rotation stretch 4x5 seconds B Single leg bridges x10 B  L stretch 3x30 seconds Thoracic rotations in modified quadruped x10 B Straight plane thoracic extensions x10   Eval 08/01/23  Objective measures, care planning,  education as below  TherEX  PPT 10x3 second holds Thoracic extension stretch over towel roll 2x30-60 seconds Scap retraction green TB 10x3 second holds Tennis ball massage to R thoracic paraspinals on wall      PATIENT EDUCATION:  Education details: exam findings, POC, HEP  Person educated: Patient Education method: Programmer, multimedia, Demonstration, and Handouts Education comprehension: verbalized understanding, returned demonstration, and needs further education  HOME EXERCISE PROGRAM:  Access Code: [email or web address] + tennis ball massage on wall for T-spine  URL: [email or web address] Date: 08/07/2023 Prepared by: Nedra Hai  Exercises - Supine Posterior Pelvic Tilt  - 2 x daily - 7 x weekly - 1 sets - 10 reps - 3 seconds  hold - Scapular Retraction with Resistance  - 2 x daily - 7 x weekly - 1 sets - 10 reps - 2 seconds  hold - Seated Thoracic Lumbar Extension with Pectoralis Stretch  - 2 x daily - 7 x weekly - 1 sets - 10 reps - 3 seconds  hold - Figure 4 Bridge  - 2 x daily - 7 x weekly - 1 sets - 10 reps - 1 second  hold - Standing 'L' Stretch at [employer]  - 1 x daily - 7  x weekly - 3 sets - 10 reps  ASSESSMENT:  CLINICAL IMPRESSION:  Pt arrives today doing OK. We continued working on functional postural training and thoracic mobility, also on progression of core work as well. Modified HEP as appropriate given his concerns about thoracic extensions over towel. Will continue to progress as tolerated.   OBJECTIVE IMPAIRMENTS: decreased ROM, decreased strength, hypomobility, increased fascial restrictions, increased muscle spasms, impaired flexibility, improper body mechanics, postural dysfunction, and pain.   ACTIVITY LIMITATIONS: carrying, lifting, standing, sleeping, and locomotion level  PARTICIPATION LIMITATIONS: meal prep, cleaning, laundry, driving, shopping, community activity, and yard work  PERSONAL FACTORS: Age, Behavior pattern, Education, Fitness,  Past/current experiences, Social background, and Time since onset of injury/illness/exacerbation are also affecting patient's functional outcome.   REHAB POTENTIAL: Good  CLINICAL DECISION MAKING: Stable/uncomplicated  EVALUATION COMPLEXITY: Low   GOALS: Goals reviewed with patient? Yes  SHORT TERM GOALS: Target date: 08/15/2023    Will be compliant with appropriate progressive HEP  Baseline: Goal status: INITIAL  2.  Will demonstrate improved functional posture with ADLs and functional task performance  Baseline:  Goal status: INITIAL  LONG TERM GOALS: Target date: 09/12/2023    Muscle spasms/trigger points to have improved by at least 50% in severity  Baseline:  Goal status: INITIAL  2.  Pain to be no more than 2-3/10 at worst  Baseline:  Goal status: INITIAL  3.  Will be able to perform all desired gym activities and home care activities with pain/fatigue in his back no more than 2-3/10 Baseline:  Goal status: INITIAL  4.  FOTO score to have met at least predicted level by time of DC  Baseline:  Goal status: INITIAL    PLAN:  PT FREQUENCY: 2x/week  PT DURATION: 6 weeks  PLANNED INTERVENTIONS: Therapeutic exercises, Therapeutic activity, Neuromuscular re-education, Gait training, Patient/Family education, Self Care, Manual therapy, and Re-evaluation.  PLAN FOR NEXT SESSION: core and postural strength, consider thoracic joint mobs, manual and DN as desired, biomechanics training, how is updated HEP feeling?    Nedra Hai, PT, DPT 08/07/23 11:56 AM    Referring diagnosis? M54.16 (ICD-10-CM) - Radiculopathy, lumbar region M54.16 (ICD-10-CM) - Lumbar radiculopathy M48.062 (ICD-10-CM) - Spinal stenosis of lumbar region with neurogenic claudication Treatment diagnosis? (if different than referring diagnosis) M54.59, M54.6, R29.3, R29.898 What was this (referring dx) caused by? []  Surgery []  Fall [x]  Ongoing issue []  Arthritis []  Other:  ____________  Laterality: [x]  Rt []  Lt []  Both  Check all possible CPT codes:  *CHOOSE 10 OR LESS*    []  16109 (Therapeutic Exercise)  []  92507 (SLP Treatment)  []  97112 (Neuro Re-ed)   []  92526 (Swallowing Treatment)   []  97116 (Gait Training)   []  [id number] (Cognitive Training, 1st 15 minutes) []  97140 (Manual Therapy)   []  97130 (Cognitive Training, each add'l 15 minutes)  []  97164 (Re-evaluation)                              []  Other, List CPT Code ____________  []  97530 (Therapeutic Activities)     []  97535 (Self Care)   [x]  All codes above (97110 - 97535)  []  97012 (Mechanical Traction)  []  97014 (E-stim Unattended)  []  97032 (E-stim manual)  []  97033 (Ionto)  []  97035 (Ultrasound) []  97750 (Physical Performance Training) []  [id number] (Aquatic Therapy) []  97016 (Vasopneumatic Device) []  [id number] (Paraffin) []  97034 (Contrast Bath) []  97597 (Wound Care 1st 20 sq cm) []   [phone] (Wound Care each add'l 20 sq cm) []  97760 (Orthotic Fabrication, Fitting, Training Initial) []  [id number] (Prosthetic Management and Training Initial) []  [id number] (Orthotic or Prosthetic Training/ Modification Subsequent)

## 2023-08-09 ENCOUNTER — Ambulatory Visit: Payer: Medicare HMO | Admitting: Physical Therapy

## 2023-08-09 ENCOUNTER — Encounter: Payer: Self-pay | Admitting: Physical Therapy

## 2023-08-09 DIAGNOSIS — M5459 Other low back pain: Secondary | ICD-10-CM | POA: Diagnosis not present

## 2023-08-09 DIAGNOSIS — M546 Pain in thoracic spine: Secondary | ICD-10-CM

## 2023-08-09 DIAGNOSIS — R29898 Other symptoms and signs involving the musculoskeletal system: Secondary | ICD-10-CM

## 2023-08-09 DIAGNOSIS — R293 Abnormal posture: Secondary | ICD-10-CM | POA: Diagnosis not present

## 2023-08-09 NOTE — Therapy (Signed)
OUTPATIENT PHYSICAL THERAPY THORACOLUMBAR TREATMENT   Patient Name: Dustin [name] MRN: [id number] DOB:[birth year], 72 y.o., male Today's Date: 08/09/2023  END OF SESSION:  PT End of Session - 08/09/23 1149     Visit Number 3    Number of Visits 13    Date for PT Re-Evaluation 09/12/23    Authorization Type [employer]    Authorization Time Period 08/01/23 to 09/12/23    Progress Note Due on Visit 10    PT Start Time 1133    PT Stop Time 1213    PT Time Calculation (min) 40 min    Activity Tolerance Patient tolerated treatment well    Behavior During Therapy [facility] for tasks assessed/performed               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   Hypertension    Hypothyroidism 07/01/2018   Paroxysmal A-fib (HCC)    S/P mitral valve repair 07/08/2017   Spinal stenosis of lumbar region 08/22/2021   Past Surgical History:  Procedure Laterality Date   CARDIAC CATHETERIZATION  05/14/2017   hx ablation for A-flutter  11/01/2017   MITRAL VALVE REPAIR (MV)/CORONARY ARTERY BYPASS GRAFTING (CABG)  07/08/2017   SVG-OM, LAA closure, MV reconstruction with annuloplasty and sliding plasty via superior septal approach/Medtronic Simplici-T Annuloplasty System, left GSV harvest 07/08/17 by Duanne Limerick, MD at [facility]   TONSILLECTOMY     removed as a child   Patient Active Problem List   Diagnosis Date Noted   S/P lumbar fusion 12/01/2021   Lumbar stenosis 11/29/2021   S/P mitral valve repair 07/01/2018   OSA (obstructive sleep apnea) 07/01/2018   GERD (gastroesophageal reflux disease) 07/01/2018   Hyperlipidemia 07/01/2018   Erectile dysfunction 07/01/2018   Hypothyroid 07/01/2018    PCP: Selina Cooley MD   REFERRING PROVIDER: London Sheer, MD  REFERRING DIAG: M54.16 (ICD-10-CM) - Radiculopathy, lumbar  region M54.16 (ICD-10-CM) - Lumbar radiculopathy M48.062 (ICD-10-CM) - Spinal stenosis of lumbar region with neurogenic claudication  Rationale for Evaluation and Treatment: Rehabilitation  THERAPY DIAG:  Other low back pain  Pain in thoracic spine  Abnormal posture  Other symptoms and signs involving the musculoskeletal system  ONSET DATE: chronic   SUBJECTIVE:  SUBJECTIVE STATEMENT:  New exercises are OK, but that night after last session I went to bed at 12pm and didn't fall asleep until 345am due to muscles jumping after out workout. Having a little bit of a fatigue like ache in that spot in my mid back right now.   PERTINENT HISTORY:  CKD, CAD, HTN, hypothyroidism, Afib, hx mitral valve repair, lumbar spinal stenosis, hx ablation due to aflutter, CABG   PAIN:  Are you having pain? Yes: NPRS scale: 5/10 Pain location: midline thoracic spine about T7-8 Pain description: ache  Aggravating factors: exercises make that area fatigue  Relieving factors: stretches, getting in recliner and stretching back with UEs overhead      PRECAUTIONS: None  RED FLAGS: None   WEIGHT BEARING RESTRICTIONS: No  FALLS:  Has patient fallen in last 6 months? No  LIVING ENVIRONMENT: Lives with: lives with their spouse Lives in: House/apartment Stairs: steps to basement and up to second floor, also 4 STE home  Has following equipment at home: Single point cane and Environmental consultant - 2 wheeled  OCCUPATION: Retired- used to Medical illustrator   PLOF: Independent, Independent with basic ADLs, Independent with gait, and Independent with transfers  PATIENT GOALS: be able to get a shot (insurance requiring PT prior to), get back feeling better   NEXT MD VISIT: Referring 09/18/23  OBJECTIVE:    DIAGNOSTIC FINDINGS:  XR of the lumbar spine from 05/20/2023 was independently reviewed and  interpreted, showing spondylolisthesis at L4/5 with significant loss of  disc height.  Posterior instrumentation at L4 and L5 with no lucency  around the screws.  Screws not backed out.  No evidence of instability on  flexion/extension views.  No fracture or dislocation seen.  No significant  disc height loss at other levels.  CLINICAL DATA:  Lumbar radiculopathy with symptoms persisting over 6 weeks of treatment. Low back pain for 9 months.   EXAM: MRI LUMBAR SPINE WITHOUT CONTRAST   TECHNIQUE: Multiplanar, multisequence MR imaging of the lumbar spine was performed. No intravenous contrast was administered.   COMPARISON:  11/28/2020   FINDINGS: Segmentation:  Standard.   Alignment:  Fused anterolisthesis at L4-5 measuring 1 cm.   Vertebrae:  No fracture, evidence of discitis, or bone lesion.   Conus medullaris and cauda equina: Conus extends to the L1 level. Conus and cauda equina appear normal.   Paraspinal and other soft tissues: Expected postoperative scarring at the posterior soft tissues of L4-5.   Disc levels:   T11-12: Disc collapse and endplate degeneration without impingement or change.   T12- L1: Unremarkable.   L1-L2: Unremarkable.   L2-L3: Disc desiccation and narrowing with circumferential bulge. Bilateral inferior foraminal protrusion.   L3-L4: Disc narrowing and circumferential bulging. Facet spurring and ligamentum flavum thickening that is progressed. Moderate left more than right foraminal narrowing.   L4-L5: Interval posterior decompression with improved thecal sac patency although there is still mild to moderate narrowing of the thecal sac. Disc height loss and bulging causes moderate foraminal stenosis asymmetric to the left. The right foramina is patent   L5-S1:Unremarkable.   IMPRESSION: 1. Interval L4-5 decompression and fusion. Improved  thecal sac patency with mild-to-moderate residual narrowing primarily from listhesis. Foraminal patency is also improved. 2. 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      PATIENT SURVEYS:  FOTO 51, predicted 55 in 12 visits     COGNITION: Overall cognitive status: Within functional limits for tasks assessed  SENSATION: Not tested  MUSCLE LENGTH:  HS mild limitation B Piriformis mild limitation B   POSTURE: rounded shoulders, forward head, increased thoracic kyphosis, and flexed trunk   PALPATION: Severe multiple mm spasms and trigger points noted B thoracic paraspinals, more severe on R side than L   LUMBAR ROM:   AROM eval  Flexion WNL  Extension Moderate limitation, compensates from hips   Right lateral flexion Mild limitation  Left lateral flexion Mild limitation   Right rotation   Left rotation    (Blank rows = not tested)  Thoracic ROM: Flexion WNL, extension WNL, lateral flexion WNL B, rotation WNL B    LOWER EXTREMITY MMT:    MMT Right eval Left eval  Hip flexion 4+ 4+  Hip extension    Hip abduction 4+ 4+  Hip adduction    Hip internal rotation    Hip external rotation    Knee flexion 4+ 4+  Knee extension 4+ 4+  Ankle dorsiflexion 4+ 4+  Ankle plantarflexion    Ankle inversion    Ankle eversion     (Blank rows = not tested)    TODAY'S TREATMENT:                                                                                                                              DATE:   08/09/23  TherEx  UBE L6x6 minutes backwards only Single leg bridges x12 B PPT + opposite alternating UE/LE x10 B  Walking bridge x10 Limited ROM curl up x10 (neutral L spine) Scapular retractions blue TB x12  Shoulder extensions blue TB x12 Pavlov press with TA set green TB x10 B Thoracic rotation strength red TB x10 B Thoracic flexion/rotation stretch x10 B Thoracic extension/rotation  stretch x10 B  Piriformis stretch 2x30 seconds B B HS stretch 2x30 seconds    08/07/23  TherEx  UBE L6x6 minutes backwards only  Thoracic flexion/rotation stretch x10 B Thoracic extension/rotation stretch x10 B  Scapular retractions green TB 15x3 second holds  Shoulder extensions green TB 15x3 second holds PPT 15x3 second holds PPT + march x12 Lumbar rotation stretch 4x5 seconds B Single leg bridges x10 B  L stretch 3x30 seconds Thoracic rotations in modified quadruped x10 B Straight plane thoracic extensions x10   Eval 08/01/23  Objective measures, care planning, education as below  TherEX  PPT 10x3 second holds Thoracic extension stretch over towel roll 2x30-60 seconds Scap retraction green TB 10x3 second holds Tennis ball massage to R thoracic paraspinals on wall      PATIENT EDUCATION:  Education details: exam findings, POC, HEP  Person educated: Patient Education method: Programmer, multimedia, Demonstration, and Handouts Education comprehension: verbalized understanding, returned demonstration, and needs further education  HOME EXERCISE PROGRAM:  Access Code: [email or web address] + tennis ball massage on wall for T-spine  URL: [email or web address] Date: 08/07/2023 Prepared by: Nedra Hai  Exercises - Supine Posterior Pelvic Tilt  - 2 x daily - 7 x  weekly - 1 sets - 10 reps - 3 seconds  hold - Scapular Retraction with Resistance  - 2 x daily - 7 x weekly - 1 sets - 10 reps - 2 seconds  hold - Seated Thoracic Lumbar Extension with Pectoralis Stretch  - 2 x daily - 7 x weekly - 1 sets - 10 reps - 3 seconds  hold - Figure 4 Bridge  - 2 x daily - 7 x weekly - 1 sets - 10 reps - 1 second  hold - Standing 'L' Stretch at [employer]  - 1 x daily - 7 x weekly - 3 sets - 10 reps  ASSESSMENT:  CLINICAL IMPRESSION:  Pt arrives today doing OK, sounds like we were able to target some weak muscles and tight areas appropriately based on his reported response to exercises from  last visit. Continued with progression of all exercises and activities today as tolerated.   OBJECTIVE IMPAIRMENTS: decreased ROM, decreased strength, hypomobility, increased fascial restrictions, increased muscle spasms, impaired flexibility, improper body mechanics, postural dysfunction, and pain.   ACTIVITY LIMITATIONS: carrying, lifting, standing, sleeping, and locomotion level  PARTICIPATION LIMITATIONS: meal prep, cleaning, laundry, driving, shopping, community activity, and yard work  PERSONAL FACTORS: Age, Behavior pattern, Education, Fitness, Past/current experiences, Social background, and Time since onset of injury/illness/exacerbation are also affecting patient's functional outcome.   REHAB POTENTIAL: Good  CLINICAL DECISION MAKING: Stable/uncomplicated  EVALUATION COMPLEXITY: Low   GOALS: Goals reviewed with patient? Yes  SHORT TERM GOALS: Target date: 08/15/2023    Will be compliant with appropriate progressive HEP  Baseline: Goal status: INITIAL  2.  Will demonstrate improved functional posture with ADLs and functional task performance  Baseline:  Goal status: INITIAL  LONG TERM GOALS: Target date: 09/12/2023    Muscle spasms/trigger points to have improved by at least 50% in severity  Baseline:  Goal status: INITIAL  2.  Pain to be no more than 2-3/10 at worst  Baseline:  Goal status: INITIAL  3.  Will be able to perform all desired gym activities and home care activities with pain/fatigue in his back no more than 2-3/10 Baseline:  Goal status: INITIAL  4.  FOTO score to have met at least predicted level by time of DC  Baseline:  Goal status: INITIAL    PLAN:  PT FREQUENCY: 2x/week  PT DURATION: 6 weeks  PLANNED INTERVENTIONS: Therapeutic exercises, Therapeutic activity, Neuromuscular re-education, Gait training, Patient/Family education, Self Care, Manual therapy, and Re-evaluation.  PLAN FOR NEXT SESSION: core and postural strength,  consider thoracic joint mobs, manual and DN as desired, biomechanics training  Nedra Hai, PT, DPT 08/09/23 12:14 PM    Referring diagnosis? M54.16 (ICD-10-CM) - Radiculopathy, lumbar region M54.16 (ICD-10-CM) - Lumbar radiculopathy M48.062 (ICD-10-CM) - Spinal stenosis of lumbar region with neurogenic claudication Treatment diagnosis? (if different than referring diagnosis) M54.59, M54.6, R29.3, R29.898 What was this (referring dx) caused by? []  Surgery []  Fall [x]  Ongoing issue []  Arthritis []  Other: ____________  Laterality: [x]  Rt []  Lt []  Both  Check all possible CPT codes:  *CHOOSE 10 OR LESS*    []  97110 (Therapeutic Exercise)  []  92507 (SLP Treatment)  []  97112 (Neuro Re-ed)   []  92526 (Swallowing Treatment)   []  97116 (Gait Training)   []  16109 (Cognitive Training, 1st 15 minutes) []  97140 (Manual Therapy)   []  97130 (Cognitive Training, each add'l 15 minutes)  []  97164 (Re-evaluation)                              []   Other, List CPT Code ____________  []  62130 (Therapeutic Activities)     []  97535 (Self Care)   [x]  All codes above (97110 - 97535)  []  97012 (Mechanical Traction)  []  97014 (E-stim Unattended)  []  97032 (E-stim manual)  []  97033 (Ionto)  []  97035 (Ultrasound) []  97750 (Physical Performance Training) []  [id number] (Aquatic Therapy) []  97016 (Vasopneumatic Device) []  [id number] (Paraffin) []  97034 (Contrast Bath) []  97597 (Wound Care 1st 20 sq cm) []  97598 (Wound Care each add'l 20 sq cm) []  97760 (Orthotic Fabrication, Fitting, Training Initial) []  [id number] (Prosthetic Management and Training Initial) []  [id number] (Orthotic or Prosthetic Training/ Modification Subsequent)

## 2023-08-12 ENCOUNTER — Ambulatory Visit: Payer: Medicare HMO | Admitting: Physical Therapy

## 2023-08-12 ENCOUNTER — Encounter: Payer: Self-pay | Admitting: Physical Therapy

## 2023-08-12 DIAGNOSIS — R293 Abnormal posture: Secondary | ICD-10-CM

## 2023-08-12 DIAGNOSIS — R29898 Other symptoms and signs involving the musculoskeletal system: Secondary | ICD-10-CM | POA: Diagnosis not present

## 2023-08-12 DIAGNOSIS — M5459 Other low back pain: Secondary | ICD-10-CM | POA: Diagnosis not present

## 2023-08-12 DIAGNOSIS — M546 Pain in thoracic spine: Secondary | ICD-10-CM

## 2023-08-12 NOTE — Therapy (Signed)
OUTPATIENT PHYSICAL THERAPY THORACOLUMBAR TREATMENT   Patient Name: Dustin [name] MRN: [id number] DOB:17-Apr-1951, 72 y.o., male Today's Date: 08/12/2023  END OF SESSION:  PT End of Session - 08/12/23 1129     Visit Number 4    Number of Visits 13    Date for PT Re-Evaluation 09/12/23    Authorization Type [employer]    Authorization Time Period 08/01/23 to 09/12/23    Progress Note Due on Visit 10    PT Start Time 1123   MHP not included in billing   PT Stop Time 1156    PT Time Calculation (min) 33 min    Activity Tolerance Patient tolerated treatment well    Behavior During Therapy [facility] for tasks assessed/performed                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   Hypertension    Hypothyroidism 07/01/2018   Paroxysmal A-fib (HCC)    S/P mitral valve repair 07/08/2017   Spinal stenosis of lumbar region 08/22/2021   Past Surgical History:  Procedure Laterality Date   CARDIAC CATHETERIZATION  05/14/2017   hx ablation for A-flutter  11/01/2017   MITRAL VALVE REPAIR (MV)/CORONARY ARTERY BYPASS GRAFTING (CABG)  07/08/2017   SVG-OM, LAA closure, MV reconstruction with annuloplasty and sliding plasty via superior septal approach/Medtronic Simplici-T Annuloplasty System, left GSV harvest 07/08/17 by Duanne Limerick, MD at [facility]   TONSILLECTOMY     removed as a child   Patient Active Problem List   Diagnosis Date Noted   S/P lumbar fusion 12/01/2021   Lumbar stenosis 11/29/2021   S/P mitral valve repair 07/01/2018   OSA (obstructive sleep apnea) 07/01/2018   GERD (gastroesophageal reflux disease) 07/01/2018   Hyperlipidemia 07/01/2018   Erectile dysfunction 07/01/2018   Hypothyroid 07/01/2018    PCP: Selina Cooley MD   REFERRING PROVIDER: London Sheer, MD  REFERRING DIAG: M54.16  (ICD-10-CM) - Radiculopathy, lumbar region M54.16 (ICD-10-CM) - Lumbar radiculopathy M48.062 (ICD-10-CM) - Spinal stenosis of lumbar region with neurogenic claudication  Rationale for Evaluation and Treatment: Rehabilitation  THERAPY DIAG:  Other low back pain  Pain in thoracic spine  Abnormal posture  Other symptoms and signs involving the musculoskeletal system  ONSET DATE: chronic   SUBJECTIVE:  SUBJECTIVE STATEMENT:  Felt good after last time, just felt fatigued after last time. Feel like back is not getting any worse but its still not getting any better.   PERTINENT HISTORY:  CKD, CAD, HTN, hypothyroidism, Afib, hx mitral valve repair, lumbar spinal stenosis, hx ablation due to aflutter, CABG   PAIN:  Are you having pain? Yes: NPRS scale: 5/10 Pain location: midline thoracic spine about T7-8 Pain description: ache  Aggravating factors: exercises make that area fatigue  Relieving factors: stretches, getting in recliner and stretching back with UEs overhead      PRECAUTIONS: None  RED FLAGS: None   WEIGHT BEARING RESTRICTIONS: No  FALLS:  Has patient fallen in last 6 months? No  LIVING ENVIRONMENT: Lives with: lives with their spouse Lives in: House/apartment Stairs: steps to basement and up to second floor, also 4 STE home  Has following equipment at home: Single point cane and Environmental consultant - 2 wheeled  OCCUPATION: Retired- used to Medical illustrator   PLOF: Independent, Independent with basic ADLs, Independent with gait, and Independent with transfers  PATIENT GOALS: be able to get a shot (insurance requiring PT prior to), get back feeling better   NEXT MD VISIT: Referring 09/18/23  OBJECTIVE:   DIAGNOSTIC FINDINGS:  XR of the lumbar spine from 05/20/2023 was  independently reviewed and  interpreted, showing spondylolisthesis at L4/5 with significant loss of  disc height.  Posterior instrumentation at L4 and L5 with no lucency  around the screws.  Screws not backed out.  No evidence of instability on  flexion/extension views.  No fracture or dislocation seen.  No significant  disc height loss at other levels.  CLINICAL DATA:  Lumbar radiculopathy with symptoms persisting over 6 weeks of treatment. Low back pain for 9 months.   EXAM: MRI LUMBAR SPINE WITHOUT CONTRAST   TECHNIQUE: Multiplanar, multisequence MR imaging of the lumbar spine was performed. No intravenous contrast was administered.   COMPARISON:  11/28/2020   FINDINGS: Segmentation:  Standard.   Alignment:  Fused anterolisthesis at L4-5 measuring 1 cm.   Vertebrae:  No fracture, evidence of discitis, or bone lesion.   Conus medullaris and cauda equina: Conus extends to the L1 level. Conus and cauda equina appear normal.   Paraspinal and other soft tissues: Expected postoperative scarring at the posterior soft tissues of L4-5.   Disc levels:   T11-12: Disc collapse and endplate degeneration without impingement or change.   T12- L1: Unremarkable.   L1-L2: Unremarkable.   L2-L3: Disc desiccation and narrowing with circumferential bulge. Bilateral inferior foraminal protrusion.   L3-L4: Disc narrowing and circumferential bulging. Facet spurring and ligamentum flavum thickening that is progressed. Moderate left more than right foraminal narrowing.   L4-L5: Interval posterior decompression with improved thecal sac patency although there is still mild to moderate narrowing of the thecal sac. Disc height loss and bulging causes moderate foraminal stenosis asymmetric to the left. The right foramina is patent   L5-S1:Unremarkable.   IMPRESSION: 1. Interval L4-5 decompression and fusion. Improved thecal sac patency with mild-to-moderate residual narrowing primarily  from listhesis. Foraminal patency is also improved. 2. 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      PATIENT SURVEYS:  FOTO 51, predicted 55 in 12 visits     COGNITION: Overall cognitive status: Within functional limits for tasks assessed     SENSATION: Not tested  MUSCLE LENGTH:  HS mild limitation B Piriformis mild limitation B   POSTURE:  rounded shoulders, forward head, increased thoracic kyphosis, and flexed trunk  08/12/23- no significant changes   PALPATION: Severe multiple mm spasms and trigger points noted B thoracic paraspinals, more severe on R side than L; 08/12/23- still with multiple spasms and trigger points in thoracic paraspinals   LUMBAR ROM:   AROM eval  Flexion WNL  Extension Moderate limitation, compensates from hips   Right lateral flexion Mild limitation  Left lateral flexion Mild limitation   Right rotation   Left rotation    (Blank rows = not tested)  Thoracic ROM: Flexion WNL, extension WNL, lateral flexion WNL B, rotation WNL B    LOWER EXTREMITY MMT:    MMT Right eval Left eval  Hip flexion 4+ 4+  Hip extension    Hip abduction 4+ 4+  Hip adduction    Hip internal rotation    Hip external rotation    Knee flexion 4+ 4+  Knee extension 4+ 4+  Ankle dorsiflexion 4+ 4+  Ankle plantarflexion    Ankle inversion    Ankle eversion     (Blank rows = not tested)    TODAY'S TREATMENT:                                                                                                                              DATE:   08/12/23  MHP thoracic spine in prone x8 minutes not included in billing   TherEx  UBE L6.3-6.5 x6 minutes backward only  PPT + opposite UE/LE flexion x10 B PPT + double bent knee lower x12 Crunches (lx spine neutral) x12 Single leg bridges x15 B  Standing Ys on wall 0# + scap retraction  x15   Manual  Tennis ball message and light trigger point  release R thoracic paraspinals      08/09/23  TherEx  UBE L6x6 minutes backwards only Single leg bridges x12 B PPT + opposite alternating UE/LE x10 B  Walking bridge x10 Limited ROM curl up x10 (neutral L spine) Scapular retractions blue TB x12  Shoulder extensions blue TB x12 Pavlov press with TA set green TB x10 B Thoracic rotation strength red TB x10 B Thoracic flexion/rotation stretch x10 B Thoracic extension/rotation stretch x10 B  Piriformis stretch 2x30 seconds B B HS stretch 2x30 seconds    08/07/23  TherEx  UBE L6x6 minutes backwards only  Thoracic flexion/rotation stretch x10 B Thoracic extension/rotation stretch x10 B  Scapular retractions green TB 15x3 second holds  Shoulder extensions green TB 15x3 second holds PPT 15x3 second holds PPT + march x12 Lumbar rotation stretch 4x5 seconds B Single leg bridges x10 B  L stretch 3x30 seconds Thoracic rotations in modified quadruped x10 B Straight plane thoracic extensions x10   Eval 08/01/23  Objective measures, care planning, education as below  TherEX  PPT 10x3 second holds Thoracic extension stretch over towel roll 2x30-60 seconds Scap retraction green TB 10x3 second holds Tennis ball massage to R thoracic  paraspinals on wall      PATIENT EDUCATION:  Education details: exam findings, POC, HEP  Person educated: Patient Education method: Explanation, Demonstration, and Handouts Education comprehension: verbalized understanding, returned demonstration, and needs further education  HOME EXERCISE PROGRAM:  Access Code: [email or web address] + tennis ball massage on wall for T-spine  URL: [email or web address] Date: 08/07/2023 Prepared by: Nedra Hai  Exercises - Supine Posterior Pelvic Tilt  - 2 x daily - 7 x weekly - 1 sets - 10 reps - 3 seconds  hold - Scapular Retraction with Resistance  - 2 x daily - 7 x weekly - 1 sets - 10 reps - 2 seconds  hold - Seated Thoracic Lumbar Extension with  Pectoralis Stretch  - 2 x daily - 7 x weekly - 1 sets - 10 reps - 3 seconds  hold - Figure 4 Bridge  - 2 x daily - 7 x weekly - 1 sets - 10 reps - 1 second  hold - Standing 'L' Stretch at [employer]  - 1 x daily - 7 x weekly - 3 sets - 10 reps  ASSESSMENT:  CLINICAL IMPRESSION:  Pt arrives today doing OK, still having fatigue and discomfort in the same area in his back-  still endorses that symptoms of concern are in mid back, not in lumbar region. We have been able to fatigue this area with exercise in PT but symptoms overall have not changed. Tried  a little different approach today to really try and target that spot with use of MHP and then increased manual focus as appropriate. Pain and symptoms seemed to improve considerably today with change in tx approach, will plan on trying dry needling to this area next visit.   OBJECTIVE IMPAIRMENTS: decreased ROM, decreased strength, hypomobility, increased fascial restrictions, increased muscle spasms, impaired flexibility, improper body mechanics, postural dysfunction, and pain.   ACTIVITY LIMITATIONS: carrying, lifting, standing, sleeping, and locomotion level  PARTICIPATION LIMITATIONS: meal prep, cleaning, laundry, driving, shopping, community activity, and yard work  PERSONAL FACTORS: Age, Behavior pattern, Education, Fitness, Past/current experiences, Social background, and Time since onset of injury/illness/exacerbation are also affecting patient's functional outcome.   REHAB POTENTIAL: Good  CLINICAL DECISION MAKING: Stable/uncomplicated  EVALUATION COMPLEXITY: Low   GOALS: Goals reviewed with patient? Yes  SHORT TERM GOALS: Target date: 08/15/2023    Will be compliant with appropriate progressive HEP  Baseline: Goal status: MET 08/12/23  2.  Will demonstrate improved functional posture with ADLs and functional task performance  Baseline:  Goal status: ONGOING 08/12/23  LONG TERM GOALS: Target date: 09/12/2023    Muscle  spasms/trigger points to have improved by at least 50% in severity  Baseline:  Goal status: INITIAL  2.  Pain to be no more than 2-3/10 at worst  Baseline:  Goal status: INITIAL  3.  Will be able to perform all desired gym activities and home care activities with pain/fatigue in his back no more than 2-3/10 Baseline:  Goal status: INITIAL  4.  FOTO score to have met at least predicted level by time of DC  Baseline:  Goal status: INITIAL    PLAN:  PT FREQUENCY: 2x/week  PT DURATION: 6 weeks  PLANNED INTERVENTIONS: Therapeutic exercises, Therapeutic activity, Neuromuscular re-education, Gait training, Patient/Family education, Self Care, Manual therapy, and Re-evaluation.  PLAN FOR NEXT SESSION: core and postural strength, consider thoracic joint mobs,  biomechanics training. How did he feel after we changed PT approach last visit? Try dry needling per pt request   Nedra Hai,  PT, DPT 08/12/23 12:08 PM    Referring diagnosis? M54.16 (ICD-10-CM) - Radiculopathy, lumbar region M54.16 (ICD-10-CM) - Lumbar radiculopathy M48.062 (ICD-10-CM) - Spinal stenosis of lumbar region with neurogenic claudication Treatment diagnosis? (if different than referring diagnosis) M54.59, M54.6, R29.3, R29.898 What was this (referring dx) caused by? []  Surgery []  Fall [x]  Ongoing issue []  Arthritis []  Other: ____________  Laterality: [x]  Rt []  Lt []  Both  Check all possible CPT codes:  *CHOOSE 10 OR LESS*    []  97110 (Therapeutic Exercise)  []  92507 (SLP Treatment)  []  97112 (Neuro Re-ed)   []  92526 (Swallowing Treatment)   []  97116 (Gait Training)   []  [id number] (Cognitive Training, 1st 15 minutes) []  97140 (Manual Therapy)   []  97130 (Cognitive Training, each add'l 15 minutes)  []  97164 (Re-evaluation)                              []  Other, List CPT Code ____________  []  97530 (Therapeutic Activities)     []  97535 (Self Care)   [x]  All codes above (97110 - 97535)  []  97012 (Mechanical  Traction)  []  97014 (E-stim Unattended)  []  97032 (E-stim manual)  []  97033 (Ionto)  []  97035 (Ultrasound) []  97750 (Physical Performance Training) []  [id number] (Aquatic Therapy) []  97016 (Vasopneumatic Device) []  [id number] (Paraffin) []  03474 (Contrast Bath) []  97597 (Wound Care 1st 20 sq cm) []  97598 (Wound Care each add'l 20 sq cm) []  97760 (Orthotic Fabrication, Fitting, Training Initial) []  [id number] (Prosthetic Management and Training Initial) []  [id number] (Orthotic or Prosthetic Training/ Modification Subsequent)

## 2023-08-15 ENCOUNTER — Encounter: Payer: Self-pay | Admitting: Physical Therapy

## 2023-08-15 ENCOUNTER — Ambulatory Visit: Payer: Medicare HMO | Admitting: Physical Therapy

## 2023-08-15 DIAGNOSIS — R29898 Other symptoms and signs involving the musculoskeletal system: Secondary | ICD-10-CM | POA: Diagnosis not present

## 2023-08-15 DIAGNOSIS — R293 Abnormal posture: Secondary | ICD-10-CM | POA: Diagnosis not present

## 2023-08-15 DIAGNOSIS — M546 Pain in thoracic spine: Secondary | ICD-10-CM

## 2023-08-15 DIAGNOSIS — M5459 Other low back pain: Secondary | ICD-10-CM | POA: Diagnosis not present

## 2023-08-15 NOTE — Therapy (Signed)
OUTPATIENT PHYSICAL THERAPY THORACOLUMBAR TREATMENT   Patient Name: Dustin [name] MRN: [id number] DOB:1951/04/12, 72 y.o., male Today's Date: 08/15/2023  END OF SESSION:  PT End of Session - 08/15/23 1413     Visit Number 5    Number of Visits 13    Date for PT Re-Evaluation 09/12/23    Authorization Type [employer]    Authorization Time Period 08/01/23 to 09/12/23    Progress Note Due on Visit 10    PT Start Time 1345    PT Stop Time 1430    PT Time Calculation (min) 45 min    Activity Tolerance Patient tolerated treatment well    Behavior During Therapy [facility] for tasks assessed/performed                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   Hypertension    Hypothyroidism 07/01/2018   Paroxysmal A-fib (HCC)    S/P mitral valve repair 07/08/2017   Spinal stenosis of lumbar region 08/22/2021   Past Surgical History:  Procedure Laterality Date   CARDIAC CATHETERIZATION  05/14/2017   hx ablation for A-flutter  11/01/2017   MITRAL VALVE REPAIR (MV)/CORONARY ARTERY BYPASS GRAFTING (CABG)  07/08/2017   SVG-OM, LAA closure, MV reconstruction with annuloplasty and sliding plasty via superior septal approach/Medtronic Simplici-T Annuloplasty System, left GSV harvest 07/08/17 by Duanne Limerick, MD at [facility]   TONSILLECTOMY     removed as a child   Patient Active Problem List   Diagnosis Date Noted   S/P lumbar fusion 12/01/2021   Lumbar stenosis 11/29/2021   S/P mitral valve repair 07/01/2018   OSA (obstructive sleep apnea) 07/01/2018   GERD (gastroesophageal reflux disease) 07/01/2018   Hyperlipidemia 07/01/2018   Erectile dysfunction 07/01/2018   Hypothyroid 07/01/2018    PCP: Selina Cooley MD   REFERRING PROVIDER: London Sheer, MD  REFERRING DIAG: M54.16 (ICD-10-CM) - Radiculopathy, lumbar  region M54.16 (ICD-10-CM) - Lumbar radiculopathy M48.062 (ICD-10-CM) - Spinal stenosis of lumbar region with neurogenic claudication  Rationale for Evaluation and Treatment: Rehabilitation  THERAPY DIAG:  Other low back pain  Pain in thoracic spine  Abnormal posture  Other symptoms and signs involving the musculoskeletal system  ONSET DATE: chronic   SUBJECTIVE:  SUBJECTIVE STATEMENT: He woke up with about 8/10 pain this morning  PERTINENT HISTORY:  CKD, CAD, HTN, hypothyroidism, Afib, hx mitral valve repair, lumbar spinal stenosis, hx ablation due to aflutter, CABG   PAIN:  Are you having pain? Yes: NPRS scale: 8/10 Pain location: midline thoracic spine about T7-8 Pain description: ache  Aggravating factors: exercises make that area fatigue  Relieving factors: stretches, getting in recliner and stretching back with UEs overhead      PRECAUTIONS: None  RED FLAGS: None   WEIGHT BEARING RESTRICTIONS: No  FALLS:  Has patient fallen in last 6 months? No  LIVING ENVIRONMENT: Lives with: lives with their spouse Lives in: House/apartment Stairs: steps to basement and up to second floor, also 4 STE home  Has following equipment at home: Single point cane and Environmental consultant - 2 wheeled  OCCUPATION: Retired- used to Medical illustrator   PLOF: Independent, Independent with basic ADLs, Independent with gait, and Independent with transfers  PATIENT GOALS: be able to get a shot (insurance requiring PT prior to), get back feeling better   NEXT MD VISIT: Referring 09/18/23  OBJECTIVE:   DIAGNOSTIC FINDINGS:  XR of the lumbar spine from 05/20/2023 was independently reviewed and  interpreted, showing spondylolisthesis at L4/5 with significant loss of  disc height.  Posterior  instrumentation at L4 and L5 with no lucency  around the screws.  Screws not backed out.  No evidence of instability on  flexion/extension views.  No fracture or dislocation seen.  No significant  disc height loss at other levels.  CLINICAL DATA:  Lumbar radiculopathy with symptoms persisting over 6 weeks of treatment. Low back pain for 9 months.   EXAM: MRI LUMBAR SPINE WITHOUT CONTRAST   TECHNIQUE: Multiplanar, multisequence MR imaging of the lumbar spine was performed. No intravenous contrast was administered.   COMPARISON:  11/28/2020   FINDINGS: Segmentation:  Standard.   Alignment:  Fused anterolisthesis at L4-5 measuring 1 cm.   Vertebrae:  No fracture, evidence of discitis, or bone lesion.   Conus medullaris and cauda equina: Conus extends to the L1 level. Conus and cauda equina appear normal.   Paraspinal and other soft tissues: Expected postoperative scarring at the posterior soft tissues of L4-5.   Disc levels:   T11-12: Disc collapse and endplate degeneration without impingement or change.   T12- L1: Unremarkable.   L1-L2: Unremarkable.   L2-L3: Disc desiccation and narrowing with circumferential bulge. Bilateral inferior foraminal protrusion.   L3-L4: Disc narrowing and circumferential bulging. Facet spurring and ligamentum flavum thickening that is progressed. Moderate left more than right foraminal narrowing.   L4-L5: Interval posterior decompression with improved thecal sac patency although there is still mild to moderate narrowing of the thecal sac. Disc height loss and bulging causes moderate foraminal stenosis asymmetric to the left. The right foramina is patent   L5-S1:Unremarkable.   IMPRESSION: 1. Interval L4-5 decompression and fusion. Improved thecal sac patency with mild-to-moderate residual narrowing primarily from listhesis. Foraminal patency is also improved. 2. 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      PATIENT SURVEYS:  FOTO 51, predicted 55 in 12 visits     COGNITION: Overall cognitive status: Within functional limits for tasks assessed     SENSATION: Not tested  MUSCLE LENGTH:  HS mild limitation B Piriformis mild limitation B   POSTURE: rounded shoulders, forward head, increased thoracic kyphosis, and flexed trunk  08/12/23- no significant changes   PALPATION: Severe  multiple mm spasms and trigger points noted B thoracic paraspinals, more severe on R side than L; 08/12/23- still with multiple spasms and trigger points in thoracic paraspinals   LUMBAR ROM:   AROM eval  Flexion WNL  Extension Moderate limitation, compensates from hips   Right lateral flexion Mild limitation  Left lateral flexion Mild limitation   Right rotation   Left rotation    (Blank rows = not tested)  Thoracic ROM: Flexion WNL, extension WNL, lateral flexion WNL B, rotation WNL B    LOWER EXTREMITY MMT:    MMT Right eval Left eval  Hip flexion 4+ 4+  Hip extension    Hip abduction 4+ 4+  Hip adduction    Hip internal rotation    Hip external rotation    Knee flexion 4+ 4+  Knee extension 4+ 4+  Ankle dorsiflexion 4+ 4+  Ankle plantarflexion    Ankle inversion    Ankle eversion     (Blank rows = not tested)    TODAY'S TREATMENT:                                                                                                                              DATE:  08/15/23 Manual therapy for active compression and skilled palpation and Trigger Point Dry-Needling  Treatment instructions: Expect mild to moderate muscle soreness. Patient Consent Given: Yes Education handout provided: YES Muscles treated: Right thoracic paraspinals and multifidi, Estim combined: Yes used micro amp current at 100 frequency to level tolerated as well as milli amp current at frequency 2 to level tolerated Treatment response/outcome: good overall  tolerance,twitch response noted   MHP thoracic spine in prone x8 minutes not included in billing   Therex Seated lumbar/thoracic stretch rolling P ball out 10 sec X 5 forward and 10 sec X 5 to diagonal to left Standing rolling pball up wall 5 sec  X10 Shoulder Horizontal abd green 2X10  08/12/23  MHP thoracic spine in prone x8 minutes not included in billing   TherEx  UBE L6.3-6.5 x6 minutes backward only  PPT + opposite UE/LE flexion x10 B PPT + double bent knee lower x12 Crunches (lx spine neutral) x12 Single leg bridges x15 B  Standing Ys on wall 0# + scap retraction  x15   Manual  Tennis ball message and light trigger point release R thoracic paraspinals      08/09/23  TherEx  UBE L6x6 minutes backwards only Single leg bridges x12 B PPT + opposite alternating UE/LE x10 B  Walking bridge x10 Limited ROM curl up x10 (neutral L spine) Scapular retractions blue TB x12  Shoulder extensions blue TB x12 Pavlov press with TA set green TB x10 B Thoracic rotation strength red TB x10 B Thoracic flexion/rotation stretch x10 B Thoracic extension/rotation stretch x10 B  Piriformis stretch 2x30 seconds B B HS stretch 2x30 seconds    08/07/23  TherEx  UBE L6x6 minutes backwards only  Thoracic  flexion/rotation stretch x10 B Thoracic extension/rotation stretch x10 B  Scapular retractions green TB 15x3 second holds  Shoulder extensions green TB 15x3 second holds PPT 15x3 second holds PPT + march x12 Lumbar rotation stretch 4x5 seconds B Single leg bridges x10 B  L stretch 3x30 seconds Thoracic rotations in modified quadruped x10 B Straight plane thoracic extensions x10   Eval 08/01/23  Objective measures, care planning, education as below  TherEX  PPT 10x3 second holds Thoracic extension stretch over towel roll 2x30-60 seconds Scap retraction green TB 10x3 second holds Tennis ball massage to R thoracic paraspinals on wall      PATIENT EDUCATION:   Education details: exam findings, POC, HEP  Person educated: Patient Education method: Programmer, multimedia, Demonstration, and Handouts Education comprehension: verbalized understanding, returned demonstration, and needs further education  HOME EXERCISE PROGRAM:  Access Code: [email or web address] + tennis ball massage on wall for T-spine  URL: [email or web address] Date: 08/07/2023 Prepared by: Nedra Hai  Exercises - Supine Posterior Pelvic Tilt  - 2 x daily - 7 x weekly - 1 sets - 10 reps - 3 seconds  hold - Scapular Retraction with Resistance  - 2 x daily - 7 x weekly - 1 sets - 10 reps - 2 seconds  hold - Seated Thoracic Lumbar Extension with Pectoralis Stretch  - 2 x daily - 7 x weekly - 1 sets - 10 reps - 3 seconds  hold - Figure 4 Bridge  - 2 x daily - 7 x weekly - 1 sets - 10 reps - 1 second  hold - Standing 'L' Stretch at [employer]  - 1 x daily - 7 x weekly - 3 sets - 10 reps  ASSESSMENT:  CLINICAL IMPRESSION: I did try DN combined with Estim today to his Rt thoracic muscles today in efforts to reduce pain, trigger points, and muscle tension noted. This was followed by stretching program and light strength work. He does express some relief and we will assess his longer term response from this next time.  OBJECTIVE IMPAIRMENTS: decreased ROM, decreased strength, hypomobility, increased fascial restrictions, increased muscle spasms, impaired flexibility, improper body mechanics, postural dysfunction, and pain.   ACTIVITY LIMITATIONS: carrying, lifting, standing, sleeping, and locomotion level  PARTICIPATION LIMITATIONS: meal prep, cleaning, laundry, driving, shopping, community activity, and yard work  PERSONAL FACTORS: Age, Behavior pattern, Education, Fitness, Past/current experiences, Social background, and Time since onset of injury/illness/exacerbation are also affecting patient's functional outcome.   REHAB POTENTIAL: Good  CLINICAL DECISION MAKING:  Stable/uncomplicated  EVALUATION COMPLEXITY: Low   GOALS: Goals reviewed with patient? Yes  SHORT TERM GOALS: Target date: 08/15/2023    Will be compliant with appropriate progressive HEP  Baseline: Goal status: MET 08/12/23  2.  Will demonstrate improved functional posture with ADLs and functional task performance  Baseline:  Goal status: ONGOING 08/12/23  LONG TERM GOALS: Target date: 09/12/2023    Muscle spasms/trigger points to have improved by at least 50% in severity  Baseline:  Goal status: INITIAL  2.  Pain to be no more than 2-3/10 at worst  Baseline:  Goal status: INITIAL  3.  Will be able to perform all desired gym activities and home care activities with pain/fatigue in his back no more than 2-3/10 Baseline:  Goal status: INITIAL  4.  FOTO score to have met at least predicted level by time of DC  Baseline:  Goal status: INITIAL    PLAN:  PT FREQUENCY: 2x/week  PT DURATION: 6 weeks  PLANNED INTERVENTIONS: Therapeutic  exercises, Therapeutic activity, Neuromuscular re-education, Gait training, Patient/Family education, Self Care, Manual therapy, and Re-evaluation.  PLAN FOR NEXT SESSION: how was DN and repeat if desired.  [name], PT, DPT 08/15/23 2:33 PM     Referring diagnosis? M54.16 (ICD-10-CM) - Radiculopathy, lumbar region M54.16 (ICD-10-CM) - Lumbar radiculopathy M48.062 (ICD-10-CM) - Spinal stenosis of lumbar region with neurogenic claudication Treatment diagnosis? (if different than referring diagnosis) M54.59, M54.6, R29.3, R29.898 What was this (referring dx) caused by? []  Surgery []  Fall [x]  Ongoing issue []  Arthritis []  Other: ____________  Laterality: [x]  Rt []  Lt []  Both  Check all possible CPT codes:  *CHOOSE 10 OR LESS*    []  97110 (Therapeutic Exercise)  []  92507 (SLP Treatment)  []  97112 (Neuro Re-ed)   []  92526 (Swallowing Treatment)   []  97116 (Gait Training)   []  [id number] (Cognitive Training, 1st 15 minutes) []   97140 (Manual Therapy)   []  97130 (Cognitive Training, each add'l 15 minutes)  []  97164 (Re-evaluation)                              []  Other, List CPT Code ____________  []  97530 (Therapeutic Activities)     []  97535 (Self Care)   [x]  All codes above (97110 - 97535)  []  40981 (Mechanical Traction)  []  97014 (E-stim Unattended)  []  97032 (E-stim manual)  []  97033 (Ionto)  []  97035 (Ultrasound) []  97750 (Physical Performance Training) []  [id number] (Aquatic Therapy) []  97016 (Vasopneumatic Device) []  [id number] (Paraffin) []  97034 (Contrast Bath) []  97597 (Wound Care 1st 20 sq cm) []  97598 (Wound Care each add'l 20 sq cm) []  97760 (Orthotic Fabrication, Fitting, Training Initial) []  [id number] (Prosthetic Management and Training Initial) []  [id number] (Orthotic or Prosthetic Training/ Modification Subsequent)

## 2023-08-16 ENCOUNTER — Encounter: Payer: Medicare HMO | Admitting: Physical Therapy

## 2023-08-19 ENCOUNTER — Encounter: Payer: Medicare HMO | Admitting: Physical Therapy

## 2023-08-19 ENCOUNTER — Encounter: Payer: Self-pay | Admitting: Physical Therapy

## 2023-08-19 ENCOUNTER — Ambulatory Visit: Payer: Medicare HMO | Admitting: Physical Therapy

## 2023-08-19 DIAGNOSIS — M5459 Other low back pain: Secondary | ICD-10-CM

## 2023-08-19 DIAGNOSIS — R293 Abnormal posture: Secondary | ICD-10-CM | POA: Diagnosis not present

## 2023-08-19 DIAGNOSIS — R29898 Other symptoms and signs involving the musculoskeletal system: Secondary | ICD-10-CM | POA: Diagnosis not present

## 2023-08-19 DIAGNOSIS — M546 Pain in thoracic spine: Secondary | ICD-10-CM

## 2023-08-19 NOTE — Therapy (Signed)
OUTPATIENT PHYSICAL THERAPY THORACOLUMBAR TREATMENT   Patient Name: Dustin [name] MRN: [id number] DOB:[birth year], 72 y.o., male Today's Date: 08/19/2023  END OF SESSION:  PT End of Session - 08/19/23 1556     Visit Number 6    Number of Visits 13    Date for PT Re-Evaluation 09/12/23    Authorization Type [employer]    Authorization Time Period 08/01/23 to 09/12/23    Progress Note Due on Visit 10    PT Start Time 1520    PT Stop Time 1610    PT Time Calculation (min) 50 min    Activity Tolerance Patient tolerated treatment well    Behavior During Therapy [facility] for tasks assessed/performed                 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   Hypertension    Hypothyroidism 07/01/2018   Paroxysmal A-fib (HCC)    S/P mitral valve repair 07/08/2017   Spinal stenosis of lumbar region 08/22/2021   Past Surgical History:  Procedure Laterality Date   CARDIAC CATHETERIZATION  05/14/2017   hx ablation for A-flutter  11/01/2017   MITRAL VALVE REPAIR (MV)/CORONARY ARTERY BYPASS GRAFTING (CABG)  07/08/2017   SVG-OM, LAA closure, MV reconstruction with annuloplasty and sliding plasty via superior septal approach/Medtronic Simplici-T Annuloplasty System, left GSV harvest 07/08/17 by Duanne Limerick, MD at [facility]   TONSILLECTOMY     removed as a child   Patient Active Problem List   Diagnosis Date Noted   S/P lumbar fusion 12/01/2021   Lumbar stenosis 11/29/2021   S/P mitral valve repair 07/01/2018   OSA (obstructive sleep apnea) 07/01/2018   GERD (gastroesophageal reflux disease) 07/01/2018   Hyperlipidemia 07/01/2018   Erectile dysfunction 07/01/2018   Hypothyroid 07/01/2018    PCP: Selina Cooley MD   REFERRING PROVIDER: London Sheer, MD  REFERRING DIAG: M54.16 (ICD-10-CM) - Radiculopathy,  lumbar region M54.16 (ICD-10-CM) - Lumbar radiculopathy M48.062 (ICD-10-CM) - Spinal stenosis of lumbar region with neurogenic claudication  Rationale for Evaluation and Treatment: Rehabilitation  THERAPY DIAG:  Other low back pain  Pain in thoracic spine  Abnormal posture  Other symptoms and signs involving the musculoskeletal system  ONSET DATE: chronic   SUBJECTIVE:  SUBJECTIVE STATEMENT: Pt arriving today with 6/10 pain in his throacic spine.   PERTINENT HISTORY:  CKD, CAD, HTN, hypothyroidism, Afib, hx mitral valve repair, lumbar spinal stenosis, hx ablation due to aflutter, CABG   PAIN:  Are you having pain? Yes: NPRS scale: 6/10 Pain location: midline thoracic spine about T7-8 Pain description: ache  Aggravating factors: exercises make that area fatigue  Relieving factors: stretches, getting in recliner and stretching back with UEs overhead      PRECAUTIONS: None  RED FLAGS: None   WEIGHT BEARING RESTRICTIONS: No  FALLS:  Has patient fallen in last 6 months? No  LIVING ENVIRONMENT: Lives with: lives with their spouse Lives in: House/apartment Stairs: steps to basement and up to second floor, also 4 STE home  Has following equipment at home: Single point cane and Environmental consultant - 2 wheeled  OCCUPATION: Retired- used to Medical illustrator   PLOF: Independent, Independent with basic ADLs, Independent with gait, and Independent with transfers  PATIENT GOALS: be able to get a shot (insurance requiring PT prior to), get back feeling better   NEXT MD VISIT: Referring 09/18/23  OBJECTIVE:   DIAGNOSTIC FINDINGS:  XR of the lumbar spine from 05/20/2023 was independently reviewed and  interpreted, showing spondylolisthesis at L4/5 with significant loss of  disc height.   Posterior instrumentation at L4 and L5 with no lucency  around the screws.  Screws not backed out.  No evidence of instability on  flexion/extension views.  No fracture or dislocation seen.  No significant  disc height loss at other levels.  CLINICAL DATA:  Lumbar radiculopathy with symptoms persisting over 6 weeks of treatment. Low back pain for 9 months.   EXAM: MRI LUMBAR SPINE WITHOUT CONTRAST   TECHNIQUE: Multiplanar, multisequence MR imaging of the lumbar spine was performed. No intravenous contrast was administered.   COMPARISON:  11/28/2020   FINDINGS: Segmentation:  Standard.   Alignment:  Fused anterolisthesis at L4-5 measuring 1 cm.   Vertebrae:  No fracture, evidence of discitis, or bone lesion.   Conus medullaris and cauda equina: Conus extends to the L1 level. Conus and cauda equina appear normal.   Paraspinal and other soft tissues: Expected postoperative scarring at the posterior soft tissues of L4-5.   Disc levels:   T11-12: Disc collapse and endplate degeneration without impingement or change.   T12- L1: Unremarkable.   L1-L2: Unremarkable.   L2-L3: Disc desiccation and narrowing with circumferential bulge. Bilateral inferior foraminal protrusion.   L3-L4: Disc narrowing and circumferential bulging. Facet spurring and ligamentum flavum thickening that is progressed. Moderate left more than right foraminal narrowing.   L4-L5: Interval posterior decompression with improved thecal sac patency although there is still mild to moderate narrowing of the thecal sac. Disc height loss and bulging causes moderate foraminal stenosis asymmetric to the left. The right foramina is patent   L5-S1:Unremarkable.   IMPRESSION: 1. Interval L4-5 decompression and fusion. Improved thecal sac patency with mild-to-moderate residual narrowing primarily from listhesis. Foraminal patency is also improved. 2. 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      PATIENT SURVEYS:  FOTO 51, predicted 55 in 12 visits     COGNITION: Overall cognitive status: Within functional limits for tasks assessed     SENSATION: Not tested  MUSCLE LENGTH:  HS mild limitation B Piriformis mild limitation B   POSTURE: rounded shoulders, forward head, increased thoracic kyphosis, and flexed trunk  08/12/23- no significant changes  PALPATION: Severe multiple mm spasms and trigger points noted B thoracic paraspinals, more severe on R side than L; 08/12/23- still with multiple spasms and trigger points in thoracic paraspinals   LUMBAR ROM:   AROM eval  Flexion WNL  Extension Moderate limitation, compensates from hips   Right lateral flexion Mild limitation  Left lateral flexion Mild limitation   Right rotation   Left rotation    (Blank rows = not tested)  Thoracic ROM: Flexion WNL, extension WNL, lateral flexion WNL B, rotation WNL B    LOWER EXTREMITY MMT:    MMT Right eval Left eval  Hip flexion 4+ 4+  Hip extension    Hip abduction 4+ 4+  Hip adduction    Hip internal rotation    Hip external rotation    Knee flexion 4+ 4+  Knee extension 4+ 4+  Ankle dorsiflexion 4+ 4+  Ankle plantarflexion    Ankle inversion    Ankle eversion     (Blank rows = not tested)    TODAY'S TREATMENT:                                                                                                                              DATE:  08/19/23 Manual therapy:  skilled palpation during trigger point DN of active trigger points Trigger Point Dry-Needling  Treatment instructions: Expect mild to moderate muscle soreness. Patient Consent Given: Yes Education handout provided: YES Muscles treated: Right thoracic paraspinals and multifidi, Estim combined: Yes used micro amp current at 100 frequency to level tolerated as well as milli amp current at frequency 2 to level tolerated Treatment response/outcome:  good overall tolerance,twitch response noted  Modalities:  Moist heat to pt's thoracic spine x 10 minutes (not added to billing) Therex:  UBE x 6 minutes backward, 2 minutes forward at level 5 Scapular squeezes x 5 holding 5 sec Side-lying thoracic stretch x 3 holding 20 sec Wall extension: elbows on the wall x 5 holding 10 sec       08/15/23 Manual therapy for active compression and skilled palpation and Trigger Point Dry-Needling  Treatment instructions: Expect mild to moderate muscle soreness. Patient Consent Given: Yes Education handout provided: YES Muscles treated: Right thoracic paraspinals and multifidi, Estim combined: Yes used micro amp current at 100 frequency to level tolerated as well as milli amp current at frequency 2 to level tolerated Treatment response/outcome: good overall tolerance,twitch response noted   MHP thoracic spine in prone x8 minutes not included in billing   Therex Seated lumbar/thoracic stretch rolling P ball out 10 sec X 5 forward and 10 sec X 5 to diagonal to left Standing rolling pball up wall 5 sec  X10 Shoulder Horizontal abd green 2X10  08/12/23  MHP thoracic spine in prone x8 minutes not included in billing   TherEx  UBE L6.3-6.5 x6 minutes backward only  PPT + opposite UE/LE flexion x10 B PPT + double bent knee lower x12  Crunches (lx spine neutral) x12 Single leg bridges x15 B  Standing Ys on wall 0# + scap retraction  x15   Manual  Tennis ball message and light trigger point release R thoracic paraspinals      08/09/23  TherEx  UBE L6x6 minutes backwards only Single leg bridges x12 B PPT + opposite alternating UE/LE x10 B  Walking bridge x10 Limited ROM curl up x10 (neutral L spine) Scapular retractions blue TB x12  Shoulder extensions blue TB x12 Pavlov press with TA set green TB x10 B Thoracic rotation strength red TB x10 B Thoracic flexion/rotation stretch x10 B Thoracic extension/rotation stretch x10 B   Piriformis stretch 2x30 seconds B B HS stretch 2x30 seconds      PATIENT EDUCATION:  Education details: exam findings, POC, HEP  Person educated: Patient Education method: Programmer, multimedia, Demonstration, and Handouts Education comprehension: verbalized understanding, returned demonstration, and needs further education  HOME EXERCISE PROGRAM:  Access Code: [email or web address] + tennis ball massage on wall for T-spine  URL: [email or web address] Date: 08/07/2023 Prepared by: Nedra Hai  Exercises - Supine Posterior Pelvic Tilt  - 2 x daily - 7 x weekly - 1 sets - 10 reps - 3 seconds  hold - Scapular Retraction with Resistance  - 2 x daily - 7 x weekly - 1 sets - 10 reps - 2 seconds  hold - Seated Thoracic Lumbar Extension with Pectoralis Stretch  - 2 x daily - 7 x weekly - 1 sets - 10 reps - 3 seconds  hold - Figure 4 Bridge  - 2 x daily - 7 x weekly - 1 sets - 10 reps - 1 second  hold - Standing 'L' Stretch at [employer]  - 1 x daily - 7 x weekly - 3 sets - 10 reps  ASSESSMENT:  CLINICAL IMPRESSION: Pt reporting good response to DN last visit and the treatment was repeated this visit. Pt with good response noted with multiple twitch responses note. Pt also tolerating thoracic stretching. Recommend continued skilled PT interventions to maximize pt's function.   OBJECTIVE IMPAIRMENTS: decreased ROM, decreased strength, hypomobility, increased fascial restrictions, increased muscle spasms, impaired flexibility, improper body mechanics, postural dysfunction, and pain.   ACTIVITY LIMITATIONS: carrying, lifting, standing, sleeping, and locomotion level  PARTICIPATION LIMITATIONS: meal prep, cleaning, laundry, driving, shopping, community activity, and yard work  PERSONAL FACTORS: Age, Behavior pattern, Education, Fitness, Past/current experiences, Social background, and Time since onset of injury/illness/exacerbation are also affecting patient's functional outcome.   REHAB POTENTIAL:  Good  CLINICAL DECISION MAKING: Stable/uncomplicated  EVALUATION COMPLEXITY: Low   GOALS: Goals reviewed with patient? Yes  SHORT TERM GOALS: Target date: 08/15/2023    Will be compliant with appropriate progressive HEP  Baseline: Goal status: MET 08/12/23  2.  Will demonstrate improved functional posture with ADLs and functional task performance  Baseline:  Goal status: ONGOING 08/12/23  LONG TERM GOALS: Target date: 09/12/2023    Muscle spasms/trigger points to have improved by at least 50% in severity  Baseline:  Goal status: INITIAL  2.  Pain to be no more than 2-3/10 at worst  Baseline:  Goal status: INITIAL  3.  Will be able to perform all desired gym activities and home care activities with pain/fatigue in his back no more than 2-3/10 Baseline:  Goal status: INITIAL  4.  FOTO score to have met at least predicted level by time of DC  Baseline:  Goal status: INITIAL    PLAN:  PT FREQUENCY: 2x/week  PT DURATION:  6 weeks  PLANNED INTERVENTIONS: Therapeutic exercises, Therapeutic activity, Neuromuscular re-education, Gait training, Patient/Family education, Self Care, Manual therapy, and Re-evaluation.  PLAN FOR NEXT SESSION:  Throacic stretching, mobility, DN as needed   Narda Amber, PT, MPT 08/19/23 4:24 PM   08/19/23 4:24 PM     Referring diagnosis? M54.16 (ICD-10-CM) - Radiculopathy, lumbar region M54.16 (ICD-10-CM) - Lumbar radiculopathy M48.062 (ICD-10-CM) - Spinal stenosis of lumbar region with neurogenic claudication Treatment diagnosis? (if different than referring diagnosis) M54.59, M54.6, R29.3, R29.898 What was this (referring dx) caused by? []  Surgery []  Fall [x]  Ongoing issue []  Arthritis []  Other: ____________  Laterality: [x]  Rt []  Lt []  Both  Check all possible CPT codes:  *CHOOSE 10 OR LESS*    []  97110 (Therapeutic Exercise)  []  92507 (SLP Treatment)  []  97112 (Neuro Re-ed)   []  92526 (Swallowing Treatment)   []   97116 (Gait Training)   []  [id number] (Cognitive Training, 1st 15 minutes) []  97140 (Manual Therapy)   []  97130 (Cognitive Training, each add'l 15 minutes)  []  97164 (Re-evaluation)                              []  Other, List CPT Code ____________  []  97530 (Therapeutic Activities)     []  97535 (Self Care)   [x]  All codes above (97110 - 97535)  []  97012 (Mechanical Traction)  []  97014 (E-stim Unattended)  []  97032 (E-stim manual)  []  97033 (Ionto)  []  97035 (Ultrasound) []  97750 (Physical Performance Training) []  [id number] (Aquatic Therapy) []  25366 (Vasopneumatic Device) []  [id number] (Paraffin) []  97034 (Contrast Bath) []  97597 (Wound Care 1st 20 sq cm) []  97598 (Wound Care each add'l 20 sq cm) []  97760 (Orthotic Fabrication, Fitting, Training Initial) []  [id number] (Prosthetic Management and Training Initial) []  [id number] (Orthotic or Prosthetic Training/ Modification Subsequent)

## 2023-08-21 ENCOUNTER — Ambulatory Visit: Payer: Medicare HMO | Admitting: Physical Therapy

## 2023-08-21 ENCOUNTER — Encounter: Payer: Self-pay | Admitting: Physical Therapy

## 2023-08-21 DIAGNOSIS — R293 Abnormal posture: Secondary | ICD-10-CM

## 2023-08-21 DIAGNOSIS — M546 Pain in thoracic spine: Secondary | ICD-10-CM | POA: Diagnosis not present

## 2023-08-21 DIAGNOSIS — M5459 Other low back pain: Secondary | ICD-10-CM

## 2023-08-21 NOTE — Therapy (Signed)
OUTPATIENT PHYSICAL THERAPY THORACOLUMBAR TREATMENT   Patient Name: Dustin [name] MRN: [id number] DOB:Mar 13, 1951, 72 y.o., male Today's Date: 08/21/2023  END OF SESSION:  PT End of Session - 08/21/23 1200     Visit Number 7    Number of Visits 13    Date for PT Re-Evaluation 09/12/23    Authorization Type [employer]    Authorization Time Period 08/01/23 to 09/12/23    Progress Note Due on Visit 10    PT Start Time 1145    PT Stop Time 1230    PT Time Calculation (min) 45 min    Activity Tolerance Patient tolerated treatment well    Behavior During Therapy 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   Hypertension    Hypothyroidism 07/01/2018   Paroxysmal A-fib (HCC)    S/P mitral valve repair 07/08/2017   Spinal stenosis of lumbar region 08/22/2021   Past Surgical History:  Procedure Laterality Date   CARDIAC CATHETERIZATION  05/14/2017   hx ablation for A-flutter  11/01/2017   MITRAL VALVE REPAIR (MV)/CORONARY ARTERY BYPASS GRAFTING (CABG)  07/08/2017   SVG-OM, LAA closure, MV reconstruction with annuloplasty and sliding plasty via superior septal approach/Medtronic Simplici-T Annuloplasty System, left GSV harvest 07/08/17 by Duanne Limerick, MD at [facility]   TONSILLECTOMY     removed as a child   Patient Active Problem List   Diagnosis Date Noted   S/P lumbar fusion 12/01/2021   Lumbar stenosis 11/29/2021   S/P mitral valve repair 07/01/2018   OSA (obstructive sleep apnea) 07/01/2018   GERD (gastroesophageal reflux disease) 07/01/2018   Hyperlipidemia 07/01/2018   Erectile dysfunction 07/01/2018   Hypothyroid 07/01/2018    PCP: Selina Cooley MD   REFERRING PROVIDER: London Sheer, MD  REFERRING DIAG: M54.16 (ICD-10-CM) - Radiculopathy,  lumbar region M54.16 (ICD-10-CM) - Lumbar radiculopathy M48.062 (ICD-10-CM) - Spinal stenosis of lumbar region with neurogenic claudication  Rationale for Evaluation and Treatment: Rehabilitation  THERAPY DIAG:  Other low back pain  Pain in thoracic spine  Abnormal posture  ONSET DATE: chronic   SUBJECTIVE:                                                                                                                                                                                           SUBJECTIVE STATEMENT: Pt states the  DN helped for about 2 days. He is still having the pain.    PERTINENT HISTORY:  CKD, CAD, HTN, hypothyroidism, Afib, hx mitral valve repair, lumbar spinal stenosis, hx ablation due to aflutter, CABG   PAIN:  Are you having pain? Yes: NPRS scale: 5/10 Pain location: midline thoracic spine about T7-8 Pain description: ache  Aggravating factors: exercises make that area fatigue  Relieving factors: stretches, getting in recliner and stretching back with UEs overhead      PRECAUTIONS: None  RED FLAGS: None   WEIGHT BEARING RESTRICTIONS: No  FALLS:  Has patient fallen in last 6 months? No  LIVING ENVIRONMENT: Lives with: lives with their spouse Lives in: House/apartment Stairs: steps to basement and up to second floor, also 4 STE home  Has following equipment at home: Single point cane and Environmental consultant - 2 wheeled  OCCUPATION: Retired- used to Medical illustrator   PLOF: Independent, Independent with basic ADLs, Independent with gait, and Independent with transfers  PATIENT GOALS: be able to get a shot (insurance requiring PT prior to), get back feeling better   NEXT MD VISIT: Referring 09/18/23  OBJECTIVE:   DIAGNOSTIC FINDINGS:  XR of the lumbar spine from 05/20/2023 was independently reviewed and  interpreted, showing spondylolisthesis at L4/5 with significant loss of  disc height.  Posterior instrumentation at L4 and L5 with no  lucency  around the screws.  Screws not backed out.  No evidence of instability on  flexion/extension views.  No fracture or dislocation seen.  No significant  disc height loss at other levels.  CLINICAL DATA:  Lumbar radiculopathy with symptoms persisting over 6 weeks of treatment. Low back pain for 9 months.   EXAM: MRI LUMBAR SPINE WITHOUT CONTRAST   TECHNIQUE: Multiplanar, multisequence MR imaging of the lumbar spine was performed. No intravenous contrast was administered.   COMPARISON:  11/28/2020   FINDINGS: Segmentation:  Standard.   Alignment:  Fused anterolisthesis at L4-5 measuring 1 cm.   Vertebrae:  No fracture, evidence of discitis, or bone lesion.   Conus medullaris and cauda equina: Conus extends to the L1 level. Conus and cauda equina appear normal.   Paraspinal and other soft tissues: Expected postoperative scarring at the posterior soft tissues of L4-5.   Disc levels:   T11-12: Disc collapse and endplate degeneration without impingement or change.   T12- L1: Unremarkable.   L1-L2: Unremarkable.   L2-L3: Disc desiccation and narrowing with circumferential bulge. Bilateral inferior foraminal protrusion.   L3-L4: Disc narrowing and circumferential bulging. Facet spurring and ligamentum flavum thickening that is progressed. Moderate left more than right foraminal narrowing.   L4-L5: Interval posterior decompression with improved thecal sac patency although there is still mild to moderate narrowing of the thecal sac. Disc height loss and bulging causes moderate foraminal stenosis asymmetric to the left. The right foramina is patent   L5-S1:Unremarkable.   IMPRESSION: 1. Interval L4-5 decompression and fusion. Improved thecal sac patency with mild-to-moderate residual narrowing primarily from listhesis. Foraminal patency is also improved. 2. 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       PATIENT SURVEYS:  FOTO 51, predicted 55 in 12 visits     COGNITION: Overall cognitive status: Within functional limits for tasks assessed     SENSATION: Not tested  MUSCLE LENGTH:  HS mild limitation B Piriformis mild limitation B   POSTURE: rounded shoulders, forward head, increased thoracic kyphosis, and flexed trunk  08/12/23- no significant changes  PALPATION: Severe multiple mm spasms and trigger points noted B thoracic paraspinals, more severe on R side than L; 08/12/23- still with multiple spasms and trigger points in thoracic paraspinals   LUMBAR ROM:   AROM eval  Flexion WNL  Extension Moderate limitation, compensates from hips   Right lateral flexion Mild limitation  Left lateral flexion Mild limitation   Right rotation   Left rotation    (Blank rows = not tested)  Thoracic ROM: Flexion WNL, extension WNL, lateral flexion WNL B, rotation WNL B    LOWER EXTREMITY MMT:    MMT Right eval Left eval  Hip flexion 4+ 4+  Hip extension    Hip abduction 4+ 4+  Hip adduction    Hip internal rotation    Hip external rotation    Knee flexion 4+ 4+  Knee extension 4+ 4+  Ankle dorsiflexion 4+ 4+  Ankle plantarflexion    Ankle inversion    Ankle eversion     (Blank rows = not tested)    TODAY'S TREATMENT:                                                                                                                              DATE:  08/21/23 Manual therapy:  skilled palpation during trigger point DN of active trigger points Trigger Point Dry-Needling  Treatment instructions: Expect mild to moderate muscle soreness. Patient Consent Given: Yes Education handout provided: YES Muscles treated: Right thoracic paraspinals and multifidi, Estim combined: Yes used micro amp current at 100 frequency to level tolerated as well as milli amp current at frequency 2 to level tolerated Treatment response/outcome: good overall tolerance,twitch response noted   Modalities:  Moist heat to pt's thoracic spine x 10 minutes (not added to billing) Therex:  Seated lumbar/thoracic stretch rolling P ball out 10 sec X 5 forward and 10 sec X 5 to diagonal Standing rolling pball up wall 5 sec  X10 Scapular squeezes x 5 holding 5 sec Side-lying thoracic stretch x 5 holding 10 sec   08/19/23 Manual therapy:  skilled palpation during trigger point DN of active trigger points Trigger Point Dry-Needling  Treatment instructions: Expect mild to moderate muscle soreness. Patient Consent Given: Yes Education handout provided: YES Muscles treated: Right thoracic paraspinals and multifidi, Estim combined: Yes used micro amp current at 100 frequency to level tolerated as well as milli amp current at frequency 2 to level tolerated Treatment response/outcome: good overall tolerance,twitch response noted  Modalities:  Moist heat to pt's thoracic spine x 10 minutes (not added to billing) Therex:  UBE x 6 minutes backward, 2 minutes forward at level 5 Scapular squeezes x 5 holding 5 sec Side-lying thoracic stretch x 3 holding 20 sec Wall extension: elbows on the wall x 5 holding 10 sec       08/15/23 Manual therapy for active compression and skilled palpation and Trigger Point Dry-Needling  Treatment instructions: Expect mild to moderate muscle soreness. Patient  Consent Given: Yes Education handout provided: YES Muscles treated: Right thoracic paraspinals and multifidi, Estim combined: Yes used micro amp current at 100 frequency to level tolerated as well as milli amp current at frequency 2 to level tolerated Treatment response/outcome: good overall tolerance,twitch response noted   MHP thoracic spine in prone x8 minutes not included in billing   Therex Seated lumbar/thoracic stretch rolling P ball out 10 sec X 5 forward and 10 sec X 5 to diagonal to left Standing rolling pball up wall 5 sec  X10 Shoulder Horizontal abd green 2X10  08/12/23  MHP  thoracic spine in prone x8 minutes not included in billing   TherEx  UBE L6.3-6.5 x6 minutes backward only  PPT + opposite UE/LE flexion x10 B PPT + double bent knee lower x12 Crunches (lx spine neutral) x12 Single leg bridges x15 B  Standing Ys on wall 0# + scap retraction  x15   Manual  Tennis ball message and light trigger point release R thoracic paraspinals    PATIENT EDUCATION:  Education details: exam findings, POC, HEP  Person educated: Patient Education method: Explanation, Demonstration, and Handouts Education comprehension: verbalized understanding, returned demonstration, and needs further education  HOME EXERCISE PROGRAM:  Access Code: [email or web address] + tennis ball massage on wall for T-spine  URL: [email or web address] Date: 08/07/2023 Prepared by: Nedra Hai  Exercises - Supine Posterior Pelvic Tilt  - 2 x daily - 7 x weekly - 1 sets - 10 reps - 3 seconds  hold - Scapular Retraction with Resistance  - 2 x daily - 7 x weekly - 1 sets - 10 reps - 2 seconds  hold - Seated Thoracic Lumbar Extension with Pectoralis Stretch  - 2 x daily - 7 x weekly - 1 sets - 10 reps - 3 seconds  hold - Figure 4 Bridge  - 2 x daily - 7 x weekly - 1 sets - 10 reps - 1 second  hold - Standing 'L' Stretch at [employer]  - 1 x daily - 7 x weekly - 3 sets - 10 reps  ASSESSMENT:  CLINICAL IMPRESSION: I continued with DN treatment combined with Estim as he relays some improvement from this.  Pt with good response noted with multiple twitch responses noted. This was followed by thoracic stretching and mobility program. PT recommending to continue with current plan  OBJECTIVE IMPAIRMENTS: decreased ROM, decreased strength, hypomobility, increased fascial restrictions, increased muscle spasms, impaired flexibility, improper body mechanics, postural dysfunction, and pain.   ACTIVITY LIMITATIONS: carrying, lifting, standing, sleeping, and locomotion level  PARTICIPATION LIMITATIONS:  meal prep, cleaning, laundry, driving, shopping, community activity, and yard work  PERSONAL FACTORS: Age, Behavior pattern, Education, Fitness, Past/current experiences, Social background, and Time since onset of injury/illness/exacerbation are also affecting patient's functional outcome.   REHAB POTENTIAL: Good  CLINICAL DECISION MAKING: Stable/uncomplicated  EVALUATION COMPLEXITY: Low   GOALS: Goals reviewed with patient? Yes  SHORT TERM GOALS: Target date: 08/15/2023    Will be compliant with appropriate progressive HEP  Baseline: Goal status: MET 08/12/23  2.  Will demonstrate improved functional posture with ADLs and functional task performance  Baseline:  Goal status: ONGOING 08/12/23  LONG TERM GOALS: Target date: 09/12/2023    Muscle spasms/trigger points to have improved by at least 50% in severity  Baseline:  Goal status: INITIAL  2.  Pain to be no more than 2-3/10 at worst  Baseline:  Goal status: INITIAL  3.  Will be able to perform all desired gym activities and  home care activities with pain/fatigue in his back no more than 2-3/10 Baseline:  Goal status: INITIAL  4.  FOTO score to have met at least predicted level by time of DC  Baseline:  Goal status: INITIAL    PLAN:  PT FREQUENCY: 2x/week  PT DURATION: 6 weeks  PLANNED INTERVENTIONS: Therapeutic exercises, Therapeutic activity, Neuromuscular re-education, Gait training, Patient/Family education, Self Care, Manual therapy, and Re-evaluation.  PLAN FOR NEXT SESSION:  Throacic stretching, mobility, DN as needed   [name], PT, DPT 08/21/23 12:09 PM      Referring diagnosis? M54.16 (ICD-10-CM) - Radiculopathy, lumbar region M54.16 (ICD-10-CM) - Lumbar radiculopathy M48.062 (ICD-10-CM) - Spinal stenosis of lumbar region with neurogenic claudication Treatment diagnosis? (if different than referring diagnosis) M54.59, M54.6, R29.3, R29.898 What was this (referring dx) caused by? []   Surgery []  Fall [x]  Ongoing issue []  Arthritis []  Other: ____________  Laterality: [x]  Rt []  Lt []  Both  Check all possible CPT codes:  *CHOOSE 10 OR LESS*    []  97110 (Therapeutic Exercise)  []  16109 (SLP Treatment)  []  97112 (Neuro Re-ed)   []  92526 (Swallowing Treatment)   []  97116 (Gait Training)   []  [id number] (Cognitive Training, 1st 15 minutes) []  97140 (Manual Therapy)   []  97130 (Cognitive Training, each add'l 15 minutes)  []  97164 (Re-evaluation)                              []  Other, List CPT Code ____________  []  97530 (Therapeutic Activities)     []  97535 (Self Care)   [x]  All codes above (97110 - 97535)  []  97012 (Mechanical Traction)  []  97014 (E-stim Unattended)  []  97032 (E-stim manual)  []  97033 (Ionto)  []  97035 (Ultrasound) []  97750 (Physical Performance Training) []  [id number] (Aquatic Therapy) []  97016 (Vasopneumatic Device) []  [id number] (Paraffin) []  97034 (Contrast Bath) []  97597 (Wound Care 1st 20 sq cm) []  97598 (Wound Care each add'l 20 sq cm) []  97760 (Orthotic Fabrication, Fitting, Training Initial) []  [id number] (Prosthetic Management and Training Initial) []  [id number] (Orthotic or Prosthetic Training/ Modification Subsequent)

## 2023-08-26 ENCOUNTER — Ambulatory Visit: Payer: Medicare HMO | Admitting: Physical Therapy

## 2023-08-26 ENCOUNTER — Encounter: Payer: Self-pay | Admitting: Physical Therapy

## 2023-08-26 DIAGNOSIS — M5459 Other low back pain: Secondary | ICD-10-CM

## 2023-08-26 DIAGNOSIS — M546 Pain in thoracic spine: Secondary | ICD-10-CM | POA: Diagnosis not present

## 2023-08-26 DIAGNOSIS — R293 Abnormal posture: Secondary | ICD-10-CM | POA: Diagnosis not present

## 2023-08-26 NOTE — Therapy (Signed)
OUTPATIENT PHYSICAL THERAPY THORACOLUMBAR TREATMENT   Patient Name: Dustin [name] MRN: [id number] DOB:[birth year], 72 y.o., male Today's Date: 08/26/2023  END OF SESSION:  PT End of Session - 08/26/23 1140     Visit Number 8    Number of Visits 13    Date for PT Re-Evaluation 09/12/23    Authorization Type [employer]    Authorization Time Period 08/01/23 to 09/12/23    Progress Note Due on Visit 10    PT Start Time 1145    PT Stop Time 1230    PT Time Calculation (min) 45 min    Activity Tolerance Patient tolerated treatment well    Behavior During Therapy [facility] for tasks assessed/performed                 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   Hypertension    Hypothyroidism 07/01/2018   Paroxysmal A-fib (HCC)    S/P mitral valve repair 07/08/2017   Spinal stenosis of lumbar region 08/22/2021   Past Surgical History:  Procedure Laterality Date   CARDIAC CATHETERIZATION  05/14/2017   hx ablation for A-flutter  11/01/2017   MITRAL VALVE REPAIR (MV)/CORONARY ARTERY BYPASS GRAFTING (CABG)  07/08/2017   SVG-OM, LAA closure, MV reconstruction with annuloplasty and sliding plasty via superior septal approach/Medtronic Simplici-T Annuloplasty System, left GSV harvest 07/08/17 by Duanne Limerick, MD at [facility]   TONSILLECTOMY     removed as a child   Patient Active Problem List   Diagnosis Date Noted   S/P lumbar fusion 12/01/2021   Lumbar stenosis 11/29/2021   S/P mitral valve repair 07/01/2018   OSA (obstructive sleep apnea) 07/01/2018   GERD (gastroesophageal reflux disease) 07/01/2018   Hyperlipidemia 07/01/2018   Erectile dysfunction 07/01/2018   Hypothyroid 07/01/2018    PCP: Selina Cooley MD   REFERRING PROVIDER: London Sheer, MD  REFERRING DIAG: M54.16 (ICD-10-CM) - Radiculopathy,  lumbar region M54.16 (ICD-10-CM) - Lumbar radiculopathy M48.062 (ICD-10-CM) - Spinal stenosis of lumbar region with neurogenic claudication  Rationale for Evaluation and Treatment: Rehabilitation  THERAPY DIAG:  Other low back pain  Pain in thoracic spine  Abnormal posture  ONSET DATE: chronic   SUBJECTIVE:                                                                                                                                                                                           SUBJECTIVE STATEMENT: Pt states he  feels like he is getting some cumulative positive results from DN and would like to have this again today  PERTINENT HISTORY:  CKD, CAD, HTN, hypothyroidism, Afib, hx mitral valve repair, lumbar spinal stenosis, hx ablation due to aflutter, CABG   PAIN:  Are you having pain? Yes: NPRS scale: 4/10 Pain location: midline thoracic spine about T7-8 Pain description: ache  Aggravating factors: exercises make that area fatigue  Relieving factors: stretches, getting in recliner and stretching back with UEs overhead      PRECAUTIONS: None  RED FLAGS: None   WEIGHT BEARING RESTRICTIONS: No  FALLS:  Has patient fallen in last 6 months? No  LIVING ENVIRONMENT: Lives with: lives with their spouse Lives in: House/apartment Stairs: steps to basement and up to second floor, also 4 STE home  Has following equipment at home: Single point cane and Environmental consultant - 2 wheeled  OCCUPATION: Retired- used to Medical illustrator   PLOF: Independent, Independent with basic ADLs, Independent with gait, and Independent with transfers  PATIENT GOALS: be able to get a shot (insurance requiring PT prior to), get back feeling better   NEXT MD VISIT: Referring 09/18/23  OBJECTIVE:   DIAGNOSTIC FINDINGS:  XR of the lumbar spine from 05/20/2023 was independently reviewed and  interpreted, showing spondylolisthesis at L4/5 with significant loss of  disc height.   Posterior instrumentation at L4 and L5 with no lucency  around the screws.  Screws not backed out.  No evidence of instability on  flexion/extension views.  No fracture or dislocation seen.  No significant  disc height loss at other levels.  CLINICAL DATA:  Lumbar radiculopathy with symptoms persisting over 6 weeks of treatment. Low back pain for 9 months.   EXAM: MRI LUMBAR SPINE WITHOUT CONTRAST   TECHNIQUE: Multiplanar, multisequence MR imaging of the lumbar spine was performed. No intravenous contrast was administered.   COMPARISON:  11/28/2020   FINDINGS: Segmentation:  Standard.   Alignment:  Fused anterolisthesis at L4-5 measuring 1 cm.   Vertebrae:  No fracture, evidence of discitis, or bone lesion.   Conus medullaris and cauda equina: Conus extends to the L1 level. Conus and cauda equina appear normal.   Paraspinal and other soft tissues: Expected postoperative scarring at the posterior soft tissues of L4-5.   Disc levels:   T11-12: Disc collapse and endplate degeneration without impingement or change.   T12- L1: Unremarkable.   L1-L2: Unremarkable.   L2-L3: Disc desiccation and narrowing with circumferential bulge. Bilateral inferior foraminal protrusion.   L3-L4: Disc narrowing and circumferential bulging. Facet spurring and ligamentum flavum thickening that is progressed. Moderate left more than right foraminal narrowing.   L4-L5: Interval posterior decompression with improved thecal sac patency although there is still mild to moderate narrowing of the thecal sac. Disc height loss and bulging causes moderate foraminal stenosis asymmetric to the left. The right foramina is patent   L5-S1:Unremarkable.   IMPRESSION: 1. Interval L4-5 decompression and fusion. Improved thecal sac patency with mild-to-moderate residual narrowing primarily from listhesis. Foraminal patency is also improved. 2. 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      PATIENT SURVEYS:  FOTO 51, predicted 55 in 12 visits     COGNITION: Overall cognitive status: Within functional limits for tasks assessed     SENSATION: Not tested  MUSCLE LENGTH:  HS mild limitation B Piriformis mild limitation B   POSTURE: rounded shoulders, forward head, increased thoracic kyphosis, and flexed trunk  08/12/23- no significant changes   PALPATION: Severe multiple mm spasms and trigger points noted B thoracic paraspinals, more severe on R side than L; 08/12/23- still with multiple spasms and trigger points in thoracic paraspinals   LUMBAR ROM:   AROM eval  Flexion WNL  Extension Moderate limitation, compensates from hips   Right lateral flexion Mild limitation  Left lateral flexion Mild limitation   Right rotation   Left rotation    (Blank rows = not tested)  Thoracic ROM: Flexion WNL, extension WNL, lateral flexion WNL B, rotation WNL B    LOWER EXTREMITY MMT:    MMT Right eval Left eval  Hip flexion 4+ 4+  Hip extension    Hip abduction 4+ 4+  Hip adduction    Hip internal rotation    Hip external rotation    Knee flexion 4+ 4+  Knee extension 4+ 4+  Ankle dorsiflexion 4+ 4+  Ankle plantarflexion    Ankle inversion    Ankle eversion     (Blank rows = not tested)    TODAY'S TREATMENT:                                                                                                                              DATE:  08/26/23 Manual therapy:  skilled palpation during trigger point DN of active trigger points Trigger Point Dry-Needling  Treatment instructions: Expect mild to moderate muscle soreness. Patient Consent Given: Yes Education handout provided: YES Muscles treated: Right thoracic paraspinals and multifidi, Estim combined: Yes used milli amp current at frequency 2-50 to level of intenesity tolerated Treatment response/outcome: good overall tolerance,twitch response noted    Therex:  Seated lumbar/thoracic stretch rolling P ball out 10 sec X 5 forward and 10 sec X 5 to diagonal Standing rolling pball up wall 5 sec  X10 Scapular squeezes x 5 holding 5 sec Side-lying thoracic stretch x 5 holding 10 sec  DATE:  08/21/23 Manual therapy:  skilled palpation during trigger point DN of active trigger points Trigger Point Dry-Needling  Treatment instructions: Expect mild to moderate muscle soreness. Patient Consent Given: Yes Education handout provided: YES Muscles treated: Right thoracic paraspinals and multifidi, Estim combined: Yes used micro amp current at 100 frequency to level tolerated as well as milli amp current at frequency 2 to level tolerated Treatment response/outcome: good overall tolerance,twitch response noted  Modalities:  Moist heat to pt's thoracic spine x 10 minutes (not added to billing) Therex:  Seated lumbar/thoracic stretch rolling P ball out 10 sec X 5 forward and 10 sec X 5 to diagonal Standing rolling pball up wall 5 sec  X10 Scapular squeezes x 5 holding 5 sec Side-lying thoracic stretch x 5 holding 10 sec   08/19/23 Manual therapy:  skilled palpation during trigger point DN of active trigger points Trigger Point Dry-Needling  Treatment instructions: Expect mild to moderate muscle soreness. Patient Consent Given: Yes Education handout provided: YES Muscles treated: Right thoracic  paraspinals and multifidi, Estim combined: Yes used micro amp current at 100 frequency to level tolerated as well as milli amp current at frequency 2 to level tolerated Treatment response/outcome: good overall tolerance,twitch response noted  Modalities:  Moist heat to pt's thoracic spine x 10 minutes (not added to billing) Therex:  UBE x 6 minutes backward, 2 minutes forward at level 5 Scapular squeezes x 5 holding 5 sec Side-lying thoracic stretch x 3 holding 20 sec Wall extension: elbows on the wall x 5 holding 10 sec  PATIENT EDUCATION:   Education details: exam findings, POC, HEP  Person educated: Patient Education method: Programmer, multimedia, Demonstration, and Handouts Education comprehension: verbalized understanding, returned demonstration, and needs further education  HOME EXERCISE PROGRAM:  Access Code: [email or web address] + tennis ball massage on wall for T-spine  URL: [email or web address] Date: 08/07/2023 Prepared by: Nedra Hai  Exercises - Supine Posterior Pelvic Tilt  - 2 x daily - 7 x weekly - 1 sets - 10 reps - 3 seconds  hold - Scapular Retraction with Resistance  - 2 x daily - 7 x weekly - 1 sets - 10 reps - 2 seconds  hold - Seated Thoracic Lumbar Extension with Pectoralis Stretch  - 2 x daily - 7 x weekly - 1 sets - 10 reps - 3 seconds  hold - Figure 4 Bridge  - 2 x daily - 7 x weekly - 1 sets - 10 reps - 1 second  hold - Standing 'L' Stretch at [employer]  - 1 x daily - 7 x weekly - 3 sets - 10 reps  ASSESSMENT:  CLINICAL IMPRESSION: DN treatment combined with Estim seemed helpful last session so this was continued again today with good response noted with multiple twitch responses noted. This was followed by thoracic stretching and mobility program.  OBJECTIVE IMPAIRMENTS: decreased ROM, decreased strength, hypomobility, increased fascial restrictions, increased muscle spasms, impaired flexibility, improper body mechanics, postural dysfunction, and pain.   ACTIVITY LIMITATIONS: carrying, lifting, standing, sleeping, and locomotion level  PARTICIPATION LIMITATIONS: meal prep, cleaning, laundry, driving, shopping, community activity, and yard work  PERSONAL FACTORS: Age, Behavior pattern, Education, Fitness, Past/current experiences, Social background, and Time since onset of injury/illness/exacerbation are also affecting patient's functional outcome.   REHAB POTENTIAL: Good  CLINICAL DECISION MAKING: Stable/uncomplicated  EVALUATION COMPLEXITY: Low   GOALS: Goals reviewed with patient?  Yes  SHORT TERM GOALS: Target date: 08/15/2023    Will be compliant with appropriate progressive HEP  Baseline: Goal status: MET 08/12/23  2.  Will demonstrate improved functional posture with ADLs and functional task performance  Baseline:  Goal status: ONGOING 08/12/23  LONG TERM GOALS: Target date: 09/12/2023    Muscle spasms/trigger points to have improved by at least 50% in severity  Baseline:  Goal status: INITIAL  2.  Pain to be no more than 2-3/10 at worst  Baseline:  Goal status: INITIAL  3.  Will be able to perform all desired gym activities and home care activities with pain/fatigue in his back no more than 2-3/10 Baseline:  Goal status: INITIAL  4.  FOTO score to have met at least predicted level by time of DC  Baseline:  Goal status: INITIAL    PLAN:  PT FREQUENCY: 2x/week  PT DURATION: 6 weeks  PLANNED INTERVENTIONS: Therapeutic exercises, Therapeutic activity, Neuromuscular re-education, Gait training, Patient/Family education, Self Care, Manual therapy, and Re-evaluation.  PLAN FOR NEXT SESSION:  Throacic stretching, mobility, DN as needed   [name], PT, DPT 08/26/23 11:41 AM  Referring diagnosis? M54.16 (ICD-10-CM) - Radiculopathy, lumbar region M54.16 (ICD-10-CM) - Lumbar radiculopathy M48.062 (ICD-10-CM) - Spinal stenosis of lumbar region with neurogenic claudication Treatment diagnosis? (if different than referring diagnosis) M54.59, M54.6, R29.3, R29.898 What was this (referring dx) caused by? []  Surgery []  Fall [x]  Ongoing issue []  Arthritis []  Other: ____________  Laterality: [x]  Rt []  Lt []  Both  Check all possible CPT codes:  *CHOOSE 10 OR LESS*    []  97110 (Therapeutic Exercise)  []  92507 (SLP Treatment)  []  97112 (Neuro Re-ed)   []  92526 (Swallowing Treatment)   []  97116 (Gait Training)   []  [id number] (Cognitive Training, 1st 15 minutes) []  97140 (Manual Therapy)   []  97130 (Cognitive Training, each add'l 15  minutes)  []  97164 (Re-evaluation)                              []  Other, List CPT Code ____________  []  97530 (Therapeutic Activities)     []  97535 (Self Care)   [x]  All codes above (97110 - 97535)  []  97012 (Mechanical Traction)  []  97014 (E-stim Unattended)  []  97032 (E-stim manual)  []  97033 (Ionto)  []  97035 (Ultrasound) []  97750 (Physical Performance Training) []  [id number] (Aquatic Therapy) []  97016 (Vasopneumatic Device) []  72536 (Paraffin) []  97034 (Contrast Bath) []  97597 (Wound Care 1st 20 sq cm) []  97598 (Wound Care each add'l 20 sq cm) []  97760 (Orthotic Fabrication, Fitting, Training Initial) []  [id number] (Prosthetic Management and Training Initial) []  [id number] (Orthotic or Prosthetic Training/ Modification Subsequent)

## 2023-08-28 ENCOUNTER — Encounter: Payer: Self-pay | Admitting: Physical Therapy

## 2023-08-28 ENCOUNTER — Ambulatory Visit: Payer: Medicare HMO | Admitting: Physical Therapy

## 2023-08-28 DIAGNOSIS — M5459 Other low back pain: Secondary | ICD-10-CM

## 2023-08-28 DIAGNOSIS — R293 Abnormal posture: Secondary | ICD-10-CM | POA: Diagnosis not present

## 2023-08-28 DIAGNOSIS — M546 Pain in thoracic spine: Secondary | ICD-10-CM | POA: Diagnosis not present

## 2023-08-28 DIAGNOSIS — R29898 Other symptoms and signs involving the musculoskeletal system: Secondary | ICD-10-CM | POA: Diagnosis not present

## 2023-08-28 NOTE — Therapy (Signed)
OUTPATIENT PHYSICAL THERAPY THORACOLUMBAR TREATMENT   Patient Name: Dustin [name] MRN: [id number] DOB:1951/10/10, 72 y.o., male Today's Date: 08/28/2023  END OF SESSION:  PT End of Session - 08/28/23 1122     Visit Number 9    Number of Visits 13    Date for PT Re-Evaluation 09/12/23    Authorization Type [employer]    Authorization Time Period 08/01/23 to 09/12/23    Progress Note Due on Visit 10    PT Start Time 1110    PT Stop Time 1150    PT Time Calculation (min) 40 min    Activity Tolerance Patient tolerated treatment well    Behavior During Therapy [facility] for tasks assessed/performed                 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   Hypertension    Hypothyroidism 07/01/2018   Paroxysmal A-fib (HCC)    S/P mitral valve repair 07/08/2017   Spinal stenosis of lumbar region 08/22/2021   Past Surgical History:  Procedure Laterality Date   CARDIAC CATHETERIZATION  05/14/2017   hx ablation for A-flutter  11/01/2017   MITRAL VALVE REPAIR (MV)/CORONARY ARTERY BYPASS GRAFTING (CABG)  07/08/2017   SVG-OM, LAA closure, MV reconstruction with annuloplasty and sliding plasty via superior septal approach/Medtronic Simplici-T Annuloplasty System, left GSV harvest 07/08/17 by Duanne Limerick, MD at [facility]   TONSILLECTOMY     removed as a child   Patient Active Problem List   Diagnosis Date Noted   S/P lumbar fusion 12/01/2021   Lumbar stenosis 11/29/2021   S/P mitral valve repair 07/01/2018   OSA (obstructive sleep apnea) 07/01/2018   GERD (gastroesophageal reflux disease) 07/01/2018   Hyperlipidemia 07/01/2018   Erectile dysfunction 07/01/2018   Hypothyroid 07/01/2018    PCP: Selina Cooley MD   REFERRING PROVIDER: London Sheer, MD  REFERRING DIAG: M54.16 (ICD-10-CM) - Radiculopathy,  lumbar region M54.16 (ICD-10-CM) - Lumbar radiculopathy M48.062 (ICD-10-CM) - Spinal stenosis of lumbar region with neurogenic claudication  Rationale for Evaluation and Treatment: Rehabilitation  THERAPY DIAG:  Other low back pain  Pain in thoracic spine  Abnormal posture  Other symptoms and signs involving the musculoskeletal system  ONSET DATE: chronic   SUBJECTIVE:  SUBJECTIVE STATEMENT: Pt states he feels good today, not really having pain but aware that it is there in thoracic area. He would like to continue with DN treatment  PERTINENT HISTORY:  CKD, CAD, HTN, hypothyroidism, Afib, hx mitral valve repair, lumbar spinal stenosis, hx ablation due to aflutter, CABG   PAIN:  Are you having pain? Yes: NPRS scale: 1/10 Pain location: midline thoracic spine about T7-8 Pain description: ache  Aggravating factors: exercises make that area fatigue  Relieving factors: stretches, getting in recliner and stretching back with UEs overhead      PRECAUTIONS: None  RED FLAGS: None   WEIGHT BEARING RESTRICTIONS: No  FALLS:  Has patient fallen in last 6 months? No  LIVING ENVIRONMENT: Lives with: lives with their spouse Lives in: House/apartment Stairs: steps to basement and up to second floor, also 4 STE home  Has following equipment at home: Single point cane and Environmental consultant - 2 wheeled  OCCUPATION: Retired- used to Medical illustrator   PLOF: Independent, Independent with basic ADLs, Independent with gait, and Independent with transfers  PATIENT GOALS: be able to get a shot (insurance requiring PT prior to), get back feeling better   NEXT MD VISIT: Referring 09/18/23  OBJECTIVE:   DIAGNOSTIC FINDINGS:  XR of the lumbar spine from 05/20/2023 was independently reviewed and   interpreted, showing spondylolisthesis at L4/5 with significant loss of  disc height.  Posterior instrumentation at L4 and L5 with no lucency  around the screws.  Screws not backed out.  No evidence of instability on  flexion/extension views.  No fracture or dislocation seen.  No significant  disc height loss at other levels.  CLINICAL DATA:  Lumbar radiculopathy with symptoms persisting over 6 weeks of treatment. Low back pain for 9 months.   EXAM: MRI LUMBAR SPINE WITHOUT CONTRAST   TECHNIQUE: Multiplanar, multisequence MR imaging of the lumbar spine was performed. No intravenous contrast was administered.   COMPARISON:  11/28/2020   FINDINGS: Segmentation:  Standard.   Alignment:  Fused anterolisthesis at L4-5 measuring 1 cm.   Vertebrae:  No fracture, evidence of discitis, or bone lesion.   Conus medullaris and cauda equina: Conus extends to the L1 level. Conus and cauda equina appear normal.   Paraspinal and other soft tissues: Expected postoperative scarring at the posterior soft tissues of L4-5.   Disc levels:   T11-12: Disc collapse and endplate degeneration without impingement or change.   T12- L1: Unremarkable.   L1-L2: Unremarkable.   L2-L3: Disc desiccation and narrowing with circumferential bulge. Bilateral inferior foraminal protrusion.   L3-L4: Disc narrowing and circumferential bulging. Facet spurring and ligamentum flavum thickening that is progressed. Moderate left more than right foraminal narrowing.   L4-L5: Interval posterior decompression with improved thecal sac patency although there is still mild to moderate narrowing of the thecal sac. Disc height loss and bulging causes moderate foraminal stenosis asymmetric to the left. The right foramina is patent   L5-S1:Unremarkable.   IMPRESSION: 1. Interval L4-5 decompression and fusion. Improved thecal sac patency with mild-to-moderate residual narrowing primarily from listhesis. Foraminal  patency is also improved. 2. 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      PATIENT SURVEYS:  FOTO 51, predicted 55 in 12 visits     COGNITION: Overall cognitive status: Within functional limits for tasks assessed     SENSATION: Not tested  MUSCLE LENGTH:  HS mild limitation B Piriformis mild limitation B   POSTURE: rounded  shoulders, forward head, increased thoracic kyphosis, and flexed trunk  08/12/23- no significant changes   PALPATION: Severe multiple mm spasms and trigger points noted B thoracic paraspinals, more severe on R side than L; 08/12/23- still with multiple spasms and trigger points in thoracic paraspinals   LUMBAR ROM:   AROM eval 08/28/23  Flexion WNL   Extension Moderate limitation, compensates from hips    Right lateral flexion Mild limitation   Left lateral flexion Mild limitation    Right rotation    Left rotation     (Blank rows = not tested)  Thoracic ROM: Flexion WNL, extension WNL, lateral flexion WNL B, rotation WNL B    LOWER EXTREMITY MMT:    MMT Right eval Left eval  Hip flexion 4+ 4+  Hip extension    Hip abduction 4+ 4+  Hip adduction    Hip internal rotation    Hip external rotation    Knee flexion 4+ 4+  Knee extension 4+ 4+  Ankle dorsiflexion 4+ 4+  Ankle plantarflexion    Ankle inversion    Ankle eversion     (Blank rows = not tested)    TODAY'S TREATMENT:                                                                                                                              DATE:  08/28/23 Manual therapy:  skilled palpation during trigger point DN of active trigger points Trigger Point Dry-Needling  Treatment instructions: Expect mild to moderate muscle soreness. Patient Consent Given: Yes Education handout provided: YES Muscles treated: Right thoracic paraspinals and multifidi, Estim combined: Yes used milli amp current at frequency 2-50 to  level of intenesity tolerated Treatment response/outcome: good overall tolerance,twitch response noted   Therex:  Seated lumbar/thoracic stretch rolling P ball out 10 sec X 5 forward and 10 sec X 5 to diagonal Standing rolling pball up wall 5 sec  X10 Unilat Scapular squeezes with rotation x 5 holding 5 sec  08/26/23 Manual therapy:  skilled palpation during trigger point DN of active trigger points Trigger Point Dry-Needling  Treatment instructions: Expect mild to moderate muscle soreness. Patient Consent Given: Yes Education handout provided: YES Muscles treated: Right thoracic paraspinals and multifidi, Estim combined: Yes used milli amp current at frequency 2-50 to level of intenesity tolerated Treatment response/outcome: good overall tolerance,twitch response noted   Therex:  Seated lumbar/thoracic stretch rolling P ball out 10 sec X 5 forward and 10 sec X 5 to diagonal Standing rolling pball up wall 5 sec  X10 Scapular squeezes x 5 holding 5 sec Side-lying thoracic stretch x 5 holding 10 sec  DATE:  08/21/23 Manual therapy:  skilled palpation during trigger point DN of active trigger points Trigger Point Dry-Needling  Treatment instructions: Expect mild to moderate muscle soreness. Patient Consent Given: Yes Education handout provided: YES Muscles treated: Right thoracic paraspinals and multifidi, Estim combined: Yes used micro amp current at 100 frequency  to level tolerated as well as milli amp current at frequency 2 to level tolerated Treatment response/outcome: good overall tolerance,twitch response noted  Modalities:  Moist heat to pt's thoracic spine x 10 minutes (not added to billing) Therex:  Seated lumbar/thoracic stretch rolling P ball out 10 sec X 5 forward and 10 sec X 5 to diagonal Standing rolling pball up wall 5 sec  X10 Scapular squeezes x 5 holding 5 sec Side-lying thoracic stretch x 5 holding 10 sec   PATIENT EDUCATION:  Education details: exam  findings, POC, HEP  Person educated: Patient Education method: Programmer, multimedia, Demonstration, and Handouts Education comprehension: verbalized understanding, returned demonstration, and needs further education  HOME EXERCISE PROGRAM:  Access Code: [email or web address] + tennis ball massage on wall for T-spine  URL: [email or web address] Date: 08/07/2023 Prepared by: Nedra Hai  Exercises - Supine Posterior Pelvic Tilt  - 2 x daily - 7 x weekly - 1 sets - 10 reps - 3 seconds  hold - Scapular Retraction with Resistance  - 2 x daily - 7 x weekly - 1 sets - 10 reps - 2 seconds  hold - Seated Thoracic Lumbar Extension with Pectoralis Stretch  - 2 x daily - 7 x weekly - 1 sets - 10 reps - 3 seconds  hold - Figure 4 Bridge  - 2 x daily - 7 x weekly - 1 sets - 10 reps - 1 second  hold - Standing 'L' Stretch at [employer]  - 1 x daily - 7 x weekly - 3 sets - 10 reps  ASSESSMENT:  CLINICAL IMPRESSION: He is feeling overall improvement and noticing he is feeling better since last few sessions so we continued with same program of DN combined with Estim as well as thoracic mobility and stretching.   OBJECTIVE IMPAIRMENTS: decreased ROM, decreased strength, hypomobility, increased fascial restrictions, increased muscle spasms, impaired flexibility, improper body mechanics, postural dysfunction, and pain.   ACTIVITY LIMITATIONS: carrying, lifting, standing, sleeping, and locomotion level  PARTICIPATION LIMITATIONS: meal prep, cleaning, laundry, driving, shopping, community activity, and yard work  PERSONAL FACTORS: Age, Behavior pattern, Education, Fitness, Past/current experiences, Social background, and Time since onset of injury/illness/exacerbation are also affecting patient's functional outcome.   REHAB POTENTIAL: Good  CLINICAL DECISION MAKING: Stable/uncomplicated  EVALUATION COMPLEXITY: Low   GOALS: Goals reviewed with patient? Yes  SHORT TERM GOALS: Target date: 08/15/2023     Will be compliant with appropriate progressive HEP  Baseline: Goal status: MET 08/12/23  2.  Will demonstrate improved functional posture with ADLs and functional task performance  Baseline:  Goal status: ONGOING 08/12/23  LONG TERM GOALS: Target date: 09/12/2023    Muscle spasms/trigger points to have improved by at least 50% in severity  Baseline:  Goal status: INITIAL  2.  Pain to be no more than 2-3/10 at worst  Baseline:  Goal status: INITIAL  3.  Will be able to perform all desired gym activities and home care activities with pain/fatigue in his back no more than 2-3/10 Baseline:  Goal status: INITIAL  4.  FOTO score to have met at least predicted level by time of DC  Baseline:  Goal status: INITIAL    PLAN:  PT FREQUENCY: 2x/week  PT DURATION: 6 weeks  PLANNED INTERVENTIONS: Therapeutic exercises, Therapeutic activity, Neuromuscular re-education, Gait training, Patient/Family education, Self Care, Manual therapy, and Re-evaluation.  PLAN FOR NEXT SESSION:  Throacic stretching, mobility, DN as needed   [name], PT, DPT 08/28/23 11:23 AM      Referring  diagnosis? M54.16 (ICD-10-CM) - Radiculopathy, lumbar region M54.16 (ICD-10-CM) - Lumbar radiculopathy M48.062 (ICD-10-CM) - Spinal stenosis of lumbar region with neurogenic claudication Treatment diagnosis? (if different than referring diagnosis) M54.59, M54.6, R29.3, R29.898 What was this (referring dx) caused by? []  Surgery []  Fall [x]  Ongoing issue []  Arthritis []  Other: ____________  Laterality: [x]  Rt []  Lt []  Both  Check all possible CPT codes:  *CHOOSE 10 OR LESS*    []  97110 (Therapeutic Exercise)  []  92507 (SLP Treatment)  []  97112 (Neuro Re-ed)   []  92526 (Swallowing Treatment)   []  97116 (Gait Training)   []  [id number] (Cognitive Training, 1st 15 minutes) []  97140 (Manual Therapy)   []  97130 (Cognitive Training, each add'l 15 minutes)  []  97164 (Re-evaluation)                               []  Other, List CPT Code ____________  []  97530 (Therapeutic Activities)     []  97535 (Self Care)   [x]  All codes above (97110 - 97535)  []  97012 (Mechanical Traction)  []  97014 (E-stim Unattended)  []  97032 (E-stim manual)  []  97033 (Ionto)  []  97035 (Ultrasound) []  97750 (Physical Performance Training) []  [id number] (Aquatic Therapy) []  97016 (Vasopneumatic Device) []  [id number] (Paraffin) []  82956 (Contrast Bath) []  97597 (Wound Care 1st 20 sq cm) []  97598 (Wound Care each add'l 20 sq cm) []  97760 (Orthotic Fabrication, Fitting, Training Initial) []  [id number] (Prosthetic Management and Training Initial) []  [id number] (Orthotic or Prosthetic Training/ Modification Subsequent)

## 2023-09-02 ENCOUNTER — Ambulatory Visit: Payer: Medicare HMO | Admitting: Physical Therapy

## 2023-09-02 ENCOUNTER — Encounter: Payer: Self-pay | Admitting: Physical Therapy

## 2023-09-02 DIAGNOSIS — R293 Abnormal posture: Secondary | ICD-10-CM

## 2023-09-02 DIAGNOSIS — M5459 Other low back pain: Secondary | ICD-10-CM | POA: Diagnosis not present

## 2023-09-02 DIAGNOSIS — M546 Pain in thoracic spine: Secondary | ICD-10-CM

## 2023-09-02 NOTE — Therapy (Signed)
OUTPATIENT PHYSICAL THERAPY THORACOLUMBAR TREATMENT/progress note Progress Note reporting period 08/01/23 to 09/02/23  See below for objective and subjective measurements relating to patients progress with PT.    Patient Name: Dustin [name] MRN: [id number] DOB:1951-05-25, 72 y.o., male Today's Date: 09/02/2023  END OF SESSION:  PT End of Session - 09/02/23 1127     Visit Number 10    Number of Visits 13    Date for PT Re-Evaluation 09/12/23    Authorization Type [employer]    Authorization Time Period 08/01/23 to 09/12/23    Progress Note Due on Visit 10    PT Start Time 1126    PT Stop Time 1205    PT Time Calculation (min) 39 min    Activity Tolerance Patient tolerated treatment well    Behavior During Therapy [facility] for tasks assessed/performed                 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   Hypertension    Hypothyroidism 07/01/2018   Paroxysmal A-fib (HCC)    S/P mitral valve repair 07/08/2017   Spinal stenosis of lumbar region 08/22/2021   Past Surgical History:  Procedure Laterality Date   CARDIAC CATHETERIZATION  05/14/2017   hx ablation for A-flutter  11/01/2017   MITRAL VALVE REPAIR (MV)/CORONARY ARTERY BYPASS GRAFTING (CABG)  07/08/2017   SVG-OM, LAA closure, MV reconstruction with annuloplasty and sliding plasty via superior septal approach/Medtronic Simplici-T Annuloplasty System, left GSV harvest 07/08/17 by Duanne Limerick, MD at [facility]   TONSILLECTOMY     removed as a child   Patient Active Problem List   Diagnosis Date Noted   S/P lumbar fusion 12/01/2021   Lumbar stenosis 11/29/2021   S/P mitral valve repair 07/01/2018   OSA (obstructive sleep apnea) 07/01/2018   GERD (gastroesophageal reflux disease) 07/01/2018   Hyperlipidemia 07/01/2018   Erectile dysfunction  07/01/2018   Hypothyroid 07/01/2018    PCP: Selina Cooley MD   REFERRING PROVIDER: London Sheer, MD  REFERRING DIAG: M54.16 (ICD-10-CM) - Radiculopathy, lumbar region M54.16 (ICD-10-CM) - Lumbar radiculopathy M48.062 (ICD-10-CM) - Spinal stenosis of lumbar region with neurogenic claudication  Rationale for Evaluation and Treatment: Rehabilitation  THERAPY DIAG:  Other low back pain  Pain in thoracic spine  Abnormal posture  ONSET DATE: chronic   SUBJECTIVE:  SUBJECTIVE STATEMENT: Pt states he had a lot of back pain yesterday after doing a lot of standing hosting a large birthday party. He rated this about 7/10 but today his back feels a lot better. Overall he feels the therapy is helping.   PERTINENT HISTORY:  CKD, CAD, HTN, hypothyroidism, Afib, hx mitral valve repair, lumbar spinal stenosis, hx ablation due to aflutter, CABG   PAIN:  Are you having pain? Yes: NPRS scale: 1/10 Pain location: midline thoracic spine about T7-8 Pain description: ache  Aggravating factors: exercises make that area fatigue  Relieving factors: stretches, getting in recliner and stretching back with UEs overhead      PRECAUTIONS: None  RED FLAGS: None   WEIGHT BEARING RESTRICTIONS: No  FALLS:  Has patient fallen in last 6 months? No  LIVING ENVIRONMENT: Lives with: lives with their spouse Lives in: House/apartment Stairs: steps to basement and up to second floor, also 4 STE home  Has following equipment at home: Single point cane and Environmental consultant - 2 wheeled  OCCUPATION: Retired- used to Medical illustrator   PLOF: Independent, Independent with basic ADLs, Independent with gait, and Independent with transfers  PATIENT GOALS: be able to get a shot (insurance requiring PT prior to), get  back feeling better   NEXT MD VISIT: Referring 09/18/23  OBJECTIVE:   DIAGNOSTIC FINDINGS:  XR of the lumbar spine from 05/20/2023 was independently reviewed and  interpreted, showing spondylolisthesis at L4/5 with significant loss of  disc height.  Posterior instrumentation at L4 and L5 with no lucency  around the screws.  Screws not backed out.  No evidence of instability on  flexion/extension views.  No fracture or dislocation seen.  No significant  disc height loss at other levels.  CLINICAL DATA:  Lumbar radiculopathy with symptoms persisting over 6 weeks of treatment. Low back pain for 9 months.   EXAM: MRI LUMBAR SPINE WITHOUT CONTRAST   TECHNIQUE: Multiplanar, multisequence MR imaging of the lumbar spine was performed. No intravenous contrast was administered.   COMPARISON:  11/28/2020   FINDINGS: Segmentation:  Standard.   Alignment:  Fused anterolisthesis at L4-5 measuring 1 cm.   Vertebrae:  No fracture, evidence of discitis, or bone lesion.   Conus medullaris and cauda equina: Conus extends to the L1 level. Conus and cauda equina appear normal.   Paraspinal and other soft tissues: Expected postoperative scarring at the posterior soft tissues of L4-5.   Disc levels:   T11-12: Disc collapse and endplate degeneration without impingement or change.   T12- L1: Unremarkable.   L1-L2: Unremarkable.   L2-L3: Disc desiccation and narrowing with circumferential bulge. Bilateral inferior foraminal protrusion.   L3-L4: Disc narrowing and circumferential bulging. Facet spurring and ligamentum flavum thickening that is progressed. Moderate left more than right foraminal narrowing.   L4-L5: Interval posterior decompression with improved thecal sac patency although there is still mild to moderate narrowing of the thecal sac. Disc height loss and bulging causes moderate foraminal stenosis asymmetric to the left. The right foramina is patent    L5-S1:Unremarkable.   IMPRESSION: 1. Interval L4-5 decompression and fusion. Improved thecal sac patency with mild-to-moderate residual narrowing primarily from listhesis. Foraminal patency is also improved. 2. 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      PATIENT SURVEYS:  FOTO 51, predicted 55 in 12 visits  09/02/23 FOTO improved to 56% in 10 visits    COGNITION: Overall cognitive status: Within functional limits for tasks assessed  SENSATION: Not tested  MUSCLE LENGTH:  HS mild limitation B Piriformis mild limitation B   POSTURE: rounded shoulders, forward head, increased thoracic kyphosis, and flexed trunk  08/12/23- no significant changes   PALPATION: Severe multiple mm spasms and trigger points noted B thoracic paraspinals, more severe on R side than L; 08/12/23- still with multiple spasms and trigger points in thoracic paraspinals   LUMBAR ROM:   AROM eval 09/02/23  Flexion WNL WNL  Extension Moderate limitation, compensates from hips  WNL  Right lateral flexion Mild limitation WNL  Left lateral flexion Mild limitation  WNL  Right rotation    Left rotation     (Blank rows = not tested)  Thoracic ROM: Flexion WNL, extension WNL, lateral flexion WNL B, rotation WNL B    LOWER EXTREMITY MMT:    MMT Right eval Left eval Rt/Lt 09/02/23  Hip flexion 4+ 4+ 5/5  Hip extension     Hip abduction 4+ 4+ 5/5  Hip adduction     Hip internal rotation     Hip external rotation     Knee flexion 4+ 4+ 5/5  Knee extension 4+ 4+ 5/5  Ankle dorsiflexion 4+ 4+ 5/5  Ankle plantarflexion     Ankle inversion     Ankle eversion      (Blank rows = not tested)    TODAY'S TREATMENT:                                                                                                                              DATE:  08/28/23 Manual therapy:  skilled palpation during trigger point DN of active trigger  points Trigger Point Dry-Needling  Treatment instructions: Expect mild to moderate muscle soreness. Patient Consent Given: Yes Education handout provided: YES Muscles treated: Right thoracic paraspinals and multifidi, Estim combined: Yes used milli amp current at frequency 2-50 to level of intenesity tolerated Treatment response/outcome: good overall tolerance,twitch response noted  Therex:  Seated lumbar/thoracic stretch rolling P ball out 10 sec X 5 forward and 10 sec X 5 to diagonal Standing rolling pball up wall 5 sec  X10 Updated measurements and goals for progress note Updated FOTO  08/28/23 Manual therapy:  skilled palpation during trigger point DN of active trigger points Trigger Point Dry-Needling  Treatment instructions: Expect mild to moderate muscle soreness. Patient Consent Given: Yes Education handout provided: YES Muscles treated: Right thoracic paraspinals and multifidi, Estim combined: Yes used milli amp current at frequency 2-50 to level of intenesity tolerated Treatment response/outcome: good overall tolerance,twitch response noted   Therex:  Seated lumbar/thoracic stretch rolling P ball out 10 sec X 5 forward and 10 sec X 5 to diagonal Standing rolling pball up wall 5 sec  X10 Unilat Scapular squeezes with rotation x 5 holding 5 sec  08/26/23 Manual therapy:  skilled palpation during trigger point DN of active trigger points Trigger Point Dry-Needling  Treatment instructions: Expect mild to moderate muscle soreness. Patient  Consent Given: Yes Education handout provided: YES Muscles treated: Right thoracic paraspinals and multifidi, Estim combined: Yes used milli amp current at frequency 2-50 to level of intenesity tolerated Treatment response/outcome: good overall tolerance,twitch response noted   Therex:  Seated lumbar/thoracic stretch rolling P ball out 10 sec X 5 forward and 10 sec X 5 to diagonal Standing rolling pball up wall 5 sec  X10 Scapular  squeezes x 5 holding 5 sec Side-lying thoracic stretch x 5 holding 10 sec  DATE:  08/21/23 Manual therapy:  skilled palpation during trigger point DN of active trigger points Trigger Point Dry-Needling  Treatment instructions: Expect mild to moderate muscle soreness. Patient Consent Given: Yes Education handout provided: YES Muscles treated: Right thoracic paraspinals and multifidi, Estim combined: Yes used micro amp current at 100 frequency to level tolerated as well as milli amp current at frequency 2 to level tolerated Treatment response/outcome: good overall tolerance,twitch response noted  Modalities:  Moist heat to pt's thoracic spine x 10 minutes (not added to billing) Therex:  Seated lumbar/thoracic stretch rolling P ball out 10 sec X 5 forward and 10 sec X 5 to diagonal Standing rolling pball up wall 5 sec  X10 Scapular squeezes x 5 holding 5 sec Side-lying thoracic stretch x 5 holding 10 sec   PATIENT EDUCATION:  Education details: exam findings, POC, HEP  Person educated: Patient Education method: Programmer, multimedia, Demonstration, and Handouts Education comprehension: verbalized understanding, returned demonstration, and needs further education  HOME EXERCISE PROGRAM:  Access Code: [email or web address] + tennis ball massage on wall for T-spine  URL: [email or web address] Date: 08/07/2023 Prepared by: Nedra Hai  Exercises - Supine Posterior Pelvic Tilt  - 2 x daily - 7 x weekly - 1 sets - 10 reps - 3 seconds  hold - Scapular Retraction with Resistance  - 2 x daily - 7 x weekly - 1 sets - 10 reps - 2 seconds  hold - Seated Thoracic Lumbar Extension with Pectoralis Stretch  - 2 x daily - 7 x weekly - 1 sets - 10 reps - 3 seconds  hold - Figure 4 Bridge  - 2 x daily - 7 x weekly - 1 sets - 10 reps - 1 second  hold - Standing 'L' Stretch at [employer]  - 1 x daily - 7 x weekly - 3 sets - 10 reps  ASSESSMENT:  CLINICAL IMPRESSION: He has made good overall functional  progress with PT. He does still have pain but this has been somewhat managed with DN combined with Estim. He has one more visit scheduled at this time and then will follow up with MD again.   OBJECTIVE IMPAIRMENTS: decreased ROM, decreased strength, hypomobility, increased fascial restrictions, increased muscle spasms, impaired flexibility, improper body mechanics, postural dysfunction, and pain.   ACTIVITY LIMITATIONS: carrying, lifting, standing, sleeping, and locomotion level  PARTICIPATION LIMITATIONS: meal prep, cleaning, laundry, driving, shopping, community activity, and yard work  PERSONAL FACTORS: Age, Behavior pattern, Education, Fitness, Past/current experiences, Social background, and Time since onset of injury/illness/exacerbation are also affecting patient's functional outcome.   REHAB POTENTIAL: Good  CLINICAL DECISION MAKING: Stable/uncomplicated  EVALUATION COMPLEXITY: Low   GOALS: Goals reviewed with patient? Yes  SHORT TERM GOALS: Target date: 08/15/2023    Will be compliant with appropriate progressive HEP  Baseline: Goal status: MET 08/12/23  2.  Will demonstrate improved functional posture with ADLs and functional task performance  Baseline:  Goal status: MET 09/02/23  LONG TERM GOALS: Target date: 09/12/2023    Muscle  spasms/trigger points to have improved by at least 50% in severity  Baseline:  Goal status: MET 09/02/23  2.  Pain to be no more than 2-3/10 at worst  Baseline:  Goal status: has improved but ongoing and had flare up in pain last weekend, 09/02/23  3.  Will be able to perform all desired gym activities and home care activities with pain/fatigue in his back no more than 2-3/10 Baseline:  Goal status: MET 09/02/23  4.  FOTO score to have met at least predicted level by time of DC  Baseline:  Goal status: MET 09/02/23, improved to 56% and predicted was 55%    PLAN:  PT FREQUENCY: 2x/week  PT DURATION: 6 weeks  PLANNED  INTERVENTIONS: Therapeutic exercises, Therapeutic activity, Neuromuscular re-education, Gait training, Patient/Family education, Self Care, Manual therapy, and Re-evaluation.  PLAN FOR NEXT SESSION: trial of independent program  before he follows up with MD. If he feels he needs more PT after that we will submit for more visits.   [name], PT, DPT 09/02/23 11:44 AM      Referring diagnosis? M54.16 (ICD-10-CM) - Radiculopathy, lumbar region M54.16 (ICD-10-CM) - Lumbar radiculopathy M48.062 (ICD-10-CM) - Spinal stenosis of lumbar region with neurogenic claudication Treatment diagnosis? (if different than referring diagnosis) M54.59, M54.6, R29.3, R29.898 What was this (referring dx) caused by? []  Surgery []  Fall [x]  Ongoing issue []  Arthritis []  Other: ____________  Laterality: [x]  Rt []  Lt []  Both  Check all possible CPT codes:  *CHOOSE 10 OR LESS*    []  97110 (Therapeutic Exercise)  []  92507 (SLP Treatment)  []  97112 (Neuro Re-ed)   []  92526 (Swallowing Treatment)   []  40981 (Gait Training)   []  [id number] (Cognitive Training, 1st 15 minutes) []  97140 (Manual Therapy)   []  97130 (Cognitive Training, each add'l 15 minutes)  []  97164 (Re-evaluation)                              []  Other, List CPT Code ____________  []  97530 (Therapeutic Activities)     []  97535 (Self Care)   [x]  All codes above (97110 - 97535)  []  97012 (Mechanical Traction)  []  97014 (E-stim Unattended)  []  97032 (E-stim manual)  []  97033 (Ionto)  []  97035 (Ultrasound) []  97750 (Physical Performance Training) []  [id number] (Aquatic Therapy) []  97016 (Vasopneumatic Device) []  [id number] (Paraffin) []  97034 (Contrast Bath) []  97597 (Wound Care 1st 20 sq cm) []  97598 (Wound Care each add'l 20 sq cm) []  97760 (Orthotic Fabrication, Fitting, Training Initial) []  [id number] (Prosthetic Management and Training Initial) []  [id number] (Orthotic or Prosthetic Training/ Modification Subsequent)

## 2023-09-04 ENCOUNTER — Ambulatory Visit: Payer: Medicare HMO | Admitting: Physical Therapy

## 2023-09-04 ENCOUNTER — Encounter: Payer: Self-pay | Admitting: Physical Therapy

## 2023-09-04 DIAGNOSIS — M546 Pain in thoracic spine: Secondary | ICD-10-CM

## 2023-09-04 DIAGNOSIS — R293 Abnormal posture: Secondary | ICD-10-CM

## 2023-09-04 DIAGNOSIS — M5459 Other low back pain: Secondary | ICD-10-CM | POA: Diagnosis not present

## 2023-09-04 NOTE — Therapy (Signed)
OUTPATIENT PHYSICAL THERAPY TREATMENT/DISCHARGE PHYSICAL THERAPY DISCHARGE SUMMARY  Visits from Start of Care: 13  Current functional level related to goals / functional outcomes: See below   Remaining deficits: See below   Education / Equipment: HEP  Plan:  Patient goals were met except still with some pain. He is being discharged due to meeting functional goals.       Patient Name: Dustin [name] MRN: [id number] DOB:[birth year], 72 y.o., male Today's Date: 09/04/2023  END OF SESSION:  PT End of Session - 09/04/23 1205     Visit Number 11    Number of Visits 13    Date for PT Re-Evaluation 09/12/23    Authorization Type [employer]    Authorization Time Period 08/01/23 to 09/12/23    Progress Note Due on Visit 10    PT Start Time 1145    PT Stop Time 1220    PT Time Calculation (min) 35 min    Activity Tolerance Patient tolerated treatment well    Behavior During Therapy [facility] for tasks assessed/performed                 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   Hypertension    Hypothyroidism 07/01/2018   Paroxysmal A-fib (HCC)    S/P mitral valve repair 07/08/2017   Spinal stenosis of lumbar region 08/22/2021   Past Surgical History:  Procedure Laterality Date   CARDIAC CATHETERIZATION  05/14/2017   hx ablation for A-flutter  11/01/2017   MITRAL VALVE REPAIR (MV)/CORONARY ARTERY BYPASS GRAFTING (CABG)  07/08/2017   SVG-OM, LAA closure, MV reconstruction with annuloplasty and sliding plasty via superior septal approach/Medtronic Simplici-T Annuloplasty System, left GSV harvest 07/08/17 by Duanne Limerick, MD at [facility]   TONSILLECTOMY     removed as a child   Patient Active Problem List   Diagnosis Date Noted   S/P lumbar fusion 12/01/2021   Lumbar stenosis 11/29/2021   S/P mitral valve  repair 07/01/2018   OSA (obstructive sleep apnea) 07/01/2018   GERD (gastroesophageal reflux disease) 07/01/2018   Hyperlipidemia 07/01/2018   Erectile dysfunction 07/01/2018   Hypothyroid 07/01/2018    PCP: Selina Cooley MD   REFERRING PROVIDER: London Sheer, MD  REFERRING DIAG: M54.16 (ICD-10-CM) - Radiculopathy, lumbar region M54.16 (ICD-10-CM) - Lumbar radiculopathy M48.062 (ICD-10-CM) - Spinal stenosis of lumbar region with neurogenic claudication  Rationale for Evaluation and Treatment: Rehabilitation  THERAPY DIAG:  Other low back pain  Pain in thoracic spine  Abnormal posture  ONSET DATE: chronic   SUBJECTIVE:  SUBJECTIVE STATEMENT: Pt states overall doing good, he feels ready to finish up with PT today.  PERTINENT HISTORY:  CKD, CAD, HTN, hypothyroidism, Afib, hx mitral valve repair, lumbar spinal stenosis, hx ablation due to aflutter, CABG   PAIN:  Are you having pain? Yes: NPRS scale: 1/10 Pain location: midline thoracic spine about T7-8 Pain description: ache  Aggravating factors: exercises make that area fatigue  Relieving factors: stretches, getting in recliner and stretching back with UEs overhead      PRECAUTIONS: None  RED FLAGS: None   WEIGHT BEARING RESTRICTIONS: No  FALLS:  Has patient fallen in last 6 months? No  LIVING ENVIRONMENT: Lives with: lives with their spouse Lives in: House/apartment Stairs: steps to basement and up to second floor, also 4 STE home  Has following equipment at home: Single point cane and Environmental consultant - 2 wheeled  OCCUPATION: Retired- used to Medical illustrator   PLOF: Independent, Independent with basic ADLs, Independent with gait, and Independent with transfers  PATIENT GOALS: be able to get a shot (insurance  requiring PT prior to), get back feeling better   NEXT MD VISIT: Referring 09/18/23  OBJECTIVE:   DIAGNOSTIC FINDINGS:  XR of the lumbar spine from 05/20/2023 was independently reviewed and  interpreted, showing spondylolisthesis at L4/5 with significant loss of  disc height.  Posterior instrumentation at L4 and L5 with no lucency  around the screws.  Screws not backed out.  No evidence of instability on  flexion/extension views.  No fracture or dislocation seen.  No significant  disc height loss at other levels.  CLINICAL DATA:  Lumbar radiculopathy with symptoms persisting over 6 weeks of treatment. Low back pain for 9 months.   EXAM: MRI LUMBAR SPINE WITHOUT CONTRAST   TECHNIQUE: Multiplanar, multisequence MR imaging of the lumbar spine was performed. No intravenous contrast was administered.   COMPARISON:  11/28/2020   FINDINGS: Segmentation:  Standard.   Alignment:  Fused anterolisthesis at L4-5 measuring 1 cm.   Vertebrae:  No fracture, evidence of discitis, or bone lesion.   Conus medullaris and cauda equina: Conus extends to the L1 level. Conus and cauda equina appear normal.   Paraspinal and other soft tissues: Expected postoperative scarring at the posterior soft tissues of L4-5.   Disc levels:   T11-12: Disc collapse and endplate degeneration without impingement or change.   T12- L1: Unremarkable.   L1-L2: Unremarkable.   L2-L3: Disc desiccation and narrowing with circumferential bulge. Bilateral inferior foraminal protrusion.   L3-L4: Disc narrowing and circumferential bulging. Facet spurring and ligamentum flavum thickening that is progressed. Moderate left more than right foraminal narrowing.   L4-L5: Interval posterior decompression with improved thecal sac patency although there is still mild to moderate narrowing of the thecal sac. Disc height loss and bulging causes moderate foraminal stenosis asymmetric to the left. The right foramina is  patent   L5-S1:Unremarkable.   IMPRESSION: 1. Interval L4-5 decompression and fusion. Improved thecal sac patency with mild-to-moderate residual narrowing primarily from listhesis. Foraminal patency is also improved. 2. 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      PATIENT SURVEYS:  FOTO 51, predicted 55 in 12 visits  09/02/23 FOTO improved to 56% in 10 visits    COGNITION: Overall cognitive status: Within functional limits for tasks assessed     SENSATION: Not tested  MUSCLE LENGTH:  HS mild limitation B Piriformis mild limitation B   POSTURE: rounded shoulders, forward head, increased thoracic  kyphosis, and flexed trunk  08/12/23- no significant changes   PALPATION: Severe multiple mm spasms and trigger points noted B thoracic paraspinals, more severe on R side than L; 08/12/23- still with multiple spasms and trigger points in thoracic paraspinals   LUMBAR ROM:   AROM eval 09/02/23  Flexion WNL WNL  Extension Moderate limitation, compensates from hips  WNL  Right lateral flexion Mild limitation WNL  Left lateral flexion Mild limitation  WNL  Right rotation    Left rotation     (Blank rows = not tested)  Thoracic ROM: Flexion WNL, extension WNL, lateral flexion WNL B, rotation WNL B    LOWER EXTREMITY MMT:    MMT Right eval Left eval Rt/Lt 09/02/23  Hip flexion 4+ 4+ 5/5  Hip extension     Hip abduction 4+ 4+ 5/5  Hip adduction     Hip internal rotation     Hip external rotation     Knee flexion 4+ 4+ 5/5  Knee extension 4+ 4+ 5/5  Ankle dorsiflexion 4+ 4+ 5/5  Ankle plantarflexion     Ankle inversion     Ankle eversion      (Blank rows = not tested)    TODAY'S TREATMENT:                                                                                                                              DATE:  09/04/23 Manual therapy:  skilled palpation during trigger point DN of active  trigger points Trigger Point Dry-Needling  Treatment instructions: Expect mild to moderate muscle soreness. Patient Consent Given: Yes Education handout provided: YES Muscles treated: Right thoracic paraspinals and multifidi, Estim combined: Yes used milli amp current at frequency 2-50 to level of intenesity tolerated Treatment response/outcome: good overall tolerance,twitch response noted   09/02/23 Manual therapy:  skilled palpation during trigger point DN of active trigger points Trigger Point Dry-Needling  Treatment instructions: Expect mild to moderate muscle soreness. Patient Consent Given: Yes Education handout provided: YES Muscles treated: Right thoracic paraspinals and multifidi, Estim combined: Yes used milli amp current at frequency 2-50 to level of intenesity tolerated Treatment response/outcome: good overall tolerance,twitch response noted  Therex:  Seated lumbar/thoracic stretch rolling P ball out 10 sec X 5 forward and 10 sec X 5 to diagonal Standing rolling pball up wall 5 sec  X10 Updated measurements and goals for progress note Updated FOTO   PATIENT EDUCATION:  Education details: exam findings, POC, HEP  Person educated: Patient Education method: Programmer, multimedia, Demonstration, and Handouts Education comprehension: verbalized understanding, returned demonstration, and needs further education  HOME EXERCISE PROGRAM:  Access Code: [email or web address] + tennis ball massage on wall for T-spine  URL: [email or web address] Date: 08/07/2023 Prepared by: Nedra Hai  Exercises - Supine Posterior Pelvic Tilt  - 2 x daily - 7 x weekly - 1 sets - 10 reps - 3 seconds  hold - Scapular Retraction with Resistance  - 2 x daily -  7 x weekly - 1 sets - 10 reps - 2 seconds  hold - Seated Thoracic Lumbar Extension with Pectoralis Stretch  - 2 x daily - 7 x weekly - 1 sets - 10 reps - 3 seconds  hold - Figure 4 Bridge  - 2 x daily - 7 x weekly - 1 sets - 10 reps - 1 second   hold - Standing 'L' Stretch at [employer]  - 1 x daily - 7 x weekly - 3 sets - 10 reps  ASSESSMENT:  CLINICAL IMPRESSION: This is the last PT visit for now and we will transition to independent program and be discharged from PT today. He will follow up with MD in 2 weeks and will continue with HEP. See below regarding his progress with PT.   OBJECTIVE IMPAIRMENTS: decreased ROM, decreased strength, hypomobility, increased fascial restrictions, increased muscle spasms, impaired flexibility, improper body mechanics, postural dysfunction, and pain.   ACTIVITY LIMITATIONS: carrying, lifting, standing, sleeping, and locomotion level  PARTICIPATION LIMITATIONS: meal prep, cleaning, laundry, driving, shopping, community activity, and yard work  PERSONAL FACTORS: Age, Behavior pattern, Education, Fitness, Past/current experiences, Social background, and Time since onset of injury/illness/exacerbation are also affecting patient's functional outcome.   REHAB POTENTIAL: Good  CLINICAL DECISION MAKING: Stable/uncomplicated  EVALUATION COMPLEXITY: Low   GOALS: Goals reviewed with patient? Yes  SHORT TERM GOALS: Target date: 08/15/2023    Will be compliant with appropriate progressive HEP  Baseline: Goal status: MET 08/12/23  2.  Will demonstrate improved functional posture with ADLs and functional task performance  Baseline:  Goal status: MET 09/02/23  LONG TERM GOALS: Target date: 09/12/2023    Muscle spasms/trigger points to have improved by at least 50% in severity  Baseline:  Goal status: MET 09/02/23  2.  Pain to be no more than 2-3/10 at worst  Baseline:  Goal status: has improved but ongoing and had flare up in pain last weekend, 09/02/23  3.  Will be able to perform all desired gym activities and home care activities with pain/fatigue in his back no more than 2-3/10 Baseline:  Goal status: MET 09/02/23  4.  FOTO score to have met at least predicted level by time of DC   Baseline:  Goal status: MET 09/02/23, improved to 56% and predicted was 55%    PLAN:  PT FREQUENCY: 2x/week  PT DURATION: 6 weeks  PLANNED INTERVENTIONS: Therapeutic exercises, Therapeutic activity, Neuromuscular re-education, Gait training, Patient/Family education, Self Care, Manual therapy, and Re-evaluation.  PLAN FOR NEXT SESSION: trial of independent program  before he follows up with MD. If he feels he needs more PT after that we will submit for more visits.   [name], PT, DPT 09/04/23 1:10 PM      Referring diagnosis? M54.16 (ICD-10-CM) - Radiculopathy, lumbar region M54.16 (ICD-10-CM) - Lumbar radiculopathy M48.062 (ICD-10-CM) - Spinal stenosis of lumbar region with neurogenic claudication Treatment diagnosis? (if different than referring diagnosis) M54.59, M54.6, R29.3, R29.898 What was this (referring dx) caused by? []  Surgery []  Fall [x]  Ongoing issue []  Arthritis []  Other: ____________  Laterality: [x]  Rt []  Lt []  Both  Check all possible CPT codes:  *CHOOSE 10 OR LESS*    []  97110 (Therapeutic Exercise)  []  92507 (SLP Treatment)  []  97112 (Neuro Re-ed)   []  92526 (Swallowing Treatment)   []  97116 (Gait Training)   []  [id number] (Cognitive Training, 1st 15 minutes) []  97140 (Manual Therapy)   []  97130 (Cognitive Training, each add'l 15 minutes)  []  97164 (  Re-evaluation)                              []  Other, List CPT Code ____________  []  97530 (Therapeutic Activities)     []  97535 (Self Care)   [x]  All codes above (97110 - 97535)  []  97012 (Mechanical Traction)  []  97014 (E-stim Unattended)  []  97032 (E-stim manual)  []  97033 (Ionto)  []  97035 (Ultrasound) []  97750 (Physical Performance Training) []  [id number] (Aquatic Therapy) []  97016 (Vasopneumatic Device) []  [id number] (Paraffin) []  97034 (Contrast Bath) []  97597 (Wound Care 1st 20 sq cm) []  97598 (Wound Care each add'l 20 sq cm) []  97760 (Orthotic Fabrication, Fitting, Training Initial) []  [id number]  (Prosthetic Management and Training Initial) []  [id number] (Orthotic or Prosthetic Training/ Modification Subsequent)

## 2023-09-18 ENCOUNTER — Ambulatory Visit: Payer: Medicare HMO | Admitting: Orthopedic Surgery

## 2023-09-18 DIAGNOSIS — M5416 Radiculopathy, lumbar region: Secondary | ICD-10-CM

## 2023-09-18 NOTE — Progress Notes (Signed)
Orthopedic Spine Surgery Office Note   Assessment: Patient is a 72 y.o. male with low back pain and tired feeling in his legs bilaterally.  Has lateral recess and bilateral foraminal stenosis at L3/4.  He still has weakness with the proximal musculature on the left side, but has not developed any progression.  Still no radicular pain or symptoms of myelopathy     Plan: -Patient has tried PT, activity modification, home strengthening, gabapentin, Lyrica -He has stable weakness in his left upper extremity. No symptoms of myelopathy or radicular pain so will continue to monitor -He has done 6 weeks of PT now without any relief, so recommended diagnostic/therapeutic injection at L3/4 -Patient should return to office in 3 months, x-rays at next visit: none     Patient expressed understanding of the plan and all questions were answered to the patient's satisfaction.    __________________________________________________________________________   History: Patient is a 72 y.o. male who comes in today to follow-up on his lumbar and cervical spine. After our last visit, he did six weeks of PT. He found the dry needling mildly helpful but otherwise did not notice any significant changes. He is still having low back pain. He also notes heavy feeling in his bilateral lower extremities. He has not developed any new symptoms since our last visit.    In regards to his cervical spine, he still has weakness in his left proximal arm musculature. He has not noticed any new weakness. He has not developed any symptoms of myelopathy. He has not radicular pain.    Patient rates his pain as a 7/10 on VAS scale     Previous treatments: PT, activity modification, home strengthening, gabapentin, Lyrica     Physical Exam:   General: no acute distress, appears stated age Neurologic: alert, answering questions appropriately, following commands Respiratory: unlabored breathing on room air, symmetric chest  rise Psychiatric: appropriate affect, normal cadence to speech     MSK (spine):   -Strength exam                                                   Left                  Right Grip strength                5/5                  5/5 Interosseus                  5/5                  5/5 Wrist extension            5/5                  5/5 Wrist flexion                 5/5                  5/5 Elbow flexion                4+/5                5/5 Deltoid  4/5                  5/5   EHL                              4/5                  4/5 TA                                 5/5                  5/5 GSC                             5/5                  5/5 Knee extension            5/5                  5/5 Hip flexion                    5/5                  5/5   -Sensory exam                           Sensation intact to light touch in L3-S1 nerve distributions of bilateral lower extremities             Sensation intact to light touch in C5-T1 nerve distributions of bilateral upper extremities     -Hoffman sign: Negative bilaterally -Clonus: No beats bilaterally -Interosseous wasting: None seen -Grip and release test: Negative -Gait: Normal   -Brachioradialis DTR: 1/4 on the left, 1/4 on the right -Biceps DTR: 1/4 on the left, 1/4 on the right -Achilles DTR: 1/4 on the left, 1/4 on the right -Patellar tendon DTR: 2/4 on the left, 2/4 on the right   Imaging: XR of the lumbar spine from 05/20/2023 was previously independently reviewed and interpreted, showing spondylolisthesis at L4/5 with significant loss of disc height.  Posterior instrumentation at L4 and L5 with no lucency around the screws.  Screws not backed out.  No evidence of instability on flexion/extension views.  No fracture or dislocation seen.  No significant disc height loss at other levels.   MRI of the lumbar spine from 05/31/2023 was previously independently reviewed and interpreted, showing  spondylolisthesis at L4/5 with significant DDD.  Foraminal stenosis bilaterally at L4/5.  There is DDD with bilateral lateral recess stenosis and foraminal stenosis at L3/4.     Patient name: Dustin [name] Patient MRN: [id number] Date of visit: 09/18/23

## 2023-09-30 ENCOUNTER — Other Ambulatory Visit: Payer: Self-pay

## 2023-09-30 ENCOUNTER — Ambulatory Visit: Payer: Medicare HMO | Admitting: Physical Medicine and Rehabilitation

## 2023-09-30 VITALS — BP 119/67 | HR 64

## 2023-09-30 DIAGNOSIS — M5416 Radiculopathy, lumbar region: Secondary | ICD-10-CM

## 2023-09-30 MED ORDER — METHYLPREDNISOLONE ACETATE 40 MG/ML IJ SUSP
40.0000 mg | Freq: Once | INTRAMUSCULAR | Status: AC
Start: 2023-09-30 — End: 2023-09-30
  Administered 2023-09-30: 40 mg

## 2023-09-30 NOTE — Procedures (Signed)
Lumbar Epidural Steroid Injection - Interlaminar Approach with Fluoroscopic Guidance  Patient: Dustin [name]      Date of Birth: 1951-10-30 MRN: [id number] PCP: Selina Cooley, MD      Visit Date: 09/30/2023   Universal Protocol:     Consent Given By: the patient  Position: PRONE  Additional Comments: Vital signs were monitored before and after the procedure. Patient was prepped and draped in the usual sterile fashion. The correct patient, procedure, and site was verified.   Injection Procedure Details:   Procedure diagnoses: Lumbar radiculopathy [M54.16]   Meds Administered:  Meds ordered this encounter  Medications   methylPREDNISolone acetate (DEPO-MEDROL) injection 40 mg     Laterality: Left  Location/Site:  L3-4  Needle: 3.5 in., 20 ga. Tuohy  Needle Placement: Paramedian epidural  Findings:   -Comments: Excellent flow of contrast into the epidural space.  Procedure Details: Using a paramedian approach from the side mentioned above, the region overlying the inferior lamina was localized under fluoroscopic visualization and the soft tissues overlying this structure were infiltrated with 4 ml. of 1% Lidocaine without Epinephrine. The Tuohy needle was inserted into the epidural space using a paramedian approach.   The epidural space was localized using loss of resistance along with counter oblique bi-planar fluoroscopic views.  After negative aspirate for air, blood, and CSF, a 2 ml. volume of Isovue-250 was injected into the epidural space and the flow of contrast was observed. Radiographs were obtained for documentation purposes.    The injectate was administered into the level noted above.   Additional Comments:  No complications occurred Dressing: 2 x 2 sterile gauze and Band-Aid    Post-procedure details: Patient was observed during the procedure. Post-procedure instructions were reviewed.  Patient left the clinic in stable condition.

## 2023-09-30 NOTE — Progress Notes (Signed)
Dustin [name] - 72 y.o. [name] MRN [id number]  Date of birth: Dec 03, 1950  Office Visit Note: Visit Date: 09/30/2023 PCP: Selina Cooley, MD Referred by: London Sheer, MD  Subjective: Chief Complaint  Patient presents with   Lower Back - Pain   HPI:  Dustin [name] is a 72 y.o. [name] who comes in today at the request of Dr. Willia Craze for planned Left L3-4 Lumbar Interlaminar epidural steroid injection with fluoroscopic guidance.  The patient has failed conservative care including home exercise, medications, time and activity modification.  This injection will be diagnostic and hopefully therapeutic.  Please see requesting physician notes for further details and justification.   ROS Otherwise per HPI.  Assessment & Plan: Visit Diagnoses:    ICD-10-CM   1. Lumbar radiculopathy  M54.16 XR C-ARM NO REPORT    Epidural Steroid injection    methylPREDNISolone acetate (DEPO-MEDROL) injection 40 mg      Plan: No additional findings.   Meds & Orders:  Meds ordered this encounter  Medications   methylPREDNISolone acetate (DEPO-MEDROL) injection 40 mg    Orders Placed This Encounter  Procedures   XR C-ARM NO REPORT   Epidural Steroid injection    Follow-up: Return for visit to requesting provider as needed.   Procedures: No procedures performed  Lumbar Epidural Steroid Injection - Interlaminar Approach with Fluoroscopic Guidance  Patient: Dustin [name]      Date of Birth: Apr 05, 1951 MRN: [id number] PCP: Selina Cooley, MD      Visit Date: 09/30/2023   Universal Protocol:     Consent Given By: the patient  Position: PRONE  Additional Comments: Vital signs were monitored before and after the procedure. Patient was prepped and draped in the usual sterile fashion. The correct patient, procedure, and site was verified.   Injection Procedure Details:   Procedure diagnoses: Lumbar radiculopathy [M54.16]   Meds Administered:  Meds ordered this encounter  Medications    methylPREDNISolone acetate (DEPO-MEDROL) injection 40 mg     Laterality: Left  Location/Site:  L3-4  Needle: 3.5 in., 20 ga. Tuohy  Needle Placement: Paramedian epidural  Findings:   -Comments: Excellent flow of contrast into the epidural space.  Procedure Details: Using a paramedian approach from the side mentioned above, the region overlying the inferior lamina was localized under fluoroscopic visualization and the soft tissues overlying this structure were infiltrated with 4 ml. of 1% Lidocaine without Epinephrine. The Tuohy needle was inserted into the epidural space using a paramedian approach.   The epidural space was localized using loss of resistance along with counter oblique bi-planar fluoroscopic views.  After negative aspirate for air, blood, and CSF, a 2 ml. volume of Isovue-250 was injected into the epidural space and the flow of contrast was observed. Radiographs were obtained for documentation purposes.    The injectate was administered into the level noted above.   Additional Comments:  No complications occurred Dressing: 2 x 2 sterile gauze and Band-Aid    Post-procedure details: Patient was observed during the procedure. Post-procedure instructions were reviewed.  Patient left the clinic in stable condition.   Clinical History: EXAM: MRI LUMBAR SPINE WITHOUT CONTRAST   TECHNIQUE: Multiplanar, multisequence MR imaging of the lumbar spine was performed. No intravenous contrast was administered.   COMPARISON:  10/16/2011   FINDINGS: Segmentation:  5 lumbar type vertebral bodies.   Alignment: 11 mm of anterolisthesis at L4-5, increased from 7 mm on the study of 20/12.   Vertebrae: No fracture or  primary bone lesion. Chronic discogenic endplate marrow changes at T11-12, L2-3 and L4-5. No active edematous change. Insignificant hemangioma within the right posterior corner of the T11 vertebral body.   Conus medullaris and cauda equina: Conus extends to  the T12-L1 level. Conus and cauda equina appear normal.   Paraspinal and other soft tissues: Negative   Disc levels:   T11-12: Disc degeneration with mild disc bulge. No compressive stenosis.   T12-L1 and L1-2: Normal.   L2-3: Desiccation and moderate bulging of the disc. Mild facet and ligamentous prominence. Mild stenosis of both lateral recesses and foramina but without definite neural compression. Slight worsening since 2012.   L3-4: Moderate bulging of the disc. Mild facet and ligamentous hypertrophy. Mild stenosis of the lateral recesses and foramina but without definite neural compression. Slight worsening since 2012.   L4-5: Chronic facet arthropathy, with 11 mm of anterolisthesis. Chronic disc degeneration with loss of disc height. Severe multifactorial stenosis of the canal and neural foramina likely to cause neural compression. Considerable worsening since 2012.   L5-S1: Mild bulging of the disc.  No canal or foraminal stenosis.   IMPRESSION: 1. L4-5: Severe multifactorial spinal stenosis likely to cause neural compression. Considerable worsening since 2012. Contributing factors include facet arthropathy with 11 mm of anterolisthesis and bulging of the disc. 2. L2-3 and L3-4: Moderate disc bulges. Mild facet and ligamentous hypertrophy. Mild stenosis of the lateral recesses and foramina but without definite neural compression. Slight worsening since 2012.     Electronically Signed   By: Paulina Fusi M.D.   On: 11/28/2020 15:54     Objective:  VS:  HT:    WT:   BMI:     BP:119/67  HR:64bpm  TEMP: ( )  RESP:  Physical Exam Vitals and nursing note reviewed.  Constitutional:      General: He is not in acute distress.    Appearance: Normal appearance. He is not ill-appearing.  HENT:     Head: Normocephalic and atraumatic.     Right Ear: External ear normal.     Left Ear: External ear normal.     Nose: No congestion.  Eyes:     Extraocular Movements:  Extraocular movements intact.  Cardiovascular:     Rate and Rhythm: Normal rate.     Pulses: Normal pulses.  Pulmonary:     Effort: Pulmonary effort is normal. No respiratory distress.  Abdominal:     General: There is no distension.     Palpations: Abdomen is soft.  Musculoskeletal:        General: No tenderness or signs of injury.     Cervical back: Neck supple.     Right lower leg: No edema.     Left lower leg: No edema.     Comments: Patient has good distal strength without clonus.  Skin:    Findings: No erythema or rash.  Neurological:     General: No focal deficit present.     Mental Status: He is alert and oriented to person, place, and time.     Sensory: No sensory deficit.     Motor: No weakness or abnormal muscle tone.     Coordination: Coordination normal.  Psychiatric:        Mood and Affect: Mood normal.        Behavior: Behavior normal.      Imaging: No results found.

## 2023-09-30 NOTE — Patient Instructions (Signed)

## 2023-09-30 NOTE — Progress Notes (Signed)
Functional Pain Scale - descriptive words and definitions  No Pain (0)   No Pain/Loss of function  Average Pain 5   +Driver, -BT, -Dye Allergies.  Patient is here for a lower back injection. He takes Ibuprofen as needed.

## 2023-10-30 ENCOUNTER — Other Ambulatory Visit: Payer: Self-pay

## 2023-10-30 DIAGNOSIS — M542 Cervicalgia: Secondary | ICD-10-CM

## 2023-11-25 ENCOUNTER — Other Ambulatory Visit: Payer: Self-pay

## 2023-11-25 ENCOUNTER — Ambulatory Visit: Payer: Medicare HMO | Admitting: Physical Medicine and Rehabilitation

## 2023-11-25 ENCOUNTER — Encounter: Payer: Self-pay | Admitting: Physical Medicine and Rehabilitation

## 2023-11-25 VITALS — BP 144/84 | HR 77 | Ht 70.5 in | Wt 208.0 lb

## 2023-11-25 DIAGNOSIS — M5412 Radiculopathy, cervical region: Secondary | ICD-10-CM | POA: Diagnosis not present

## 2023-11-25 MED ORDER — METHYLPREDNISOLONE ACETATE 40 MG/ML IJ SUSP
40.0000 mg | Freq: Once | INTRAMUSCULAR | Status: AC
Start: 2023-11-25 — End: 2023-11-25
  Administered 2023-11-25: 40 mg

## 2023-11-25 NOTE — Progress Notes (Signed)
 Patient has complaint of neck pain that radiates down into the left arm. He occasionally has issues with his left thumb and index finger. He has left arm weakness and decreased range of motion.  He had a lot of pain and burning at area of left shoulder over the weekend. He is occasionally having to hold his left arm up with his right when performing certain activities.  He is right hand dominant. He takes ibuprofen for pain which helps, however, his PCP does not want him to take it as it is not good for his kidneys. Patient does take Aspirin 81mg  daily.  Functional Pain Scale - descriptive words and definitions  Distracting (5)    Aware of pain/able to complete some ADL's but limited by pain/sleep is affected and active distractions are only slightly useful. Moderate range order  Average Pain 5   +Driver, -BT, -Dye Allergies.

## 2023-11-25 NOTE — Progress Notes (Signed)
 Dustin [name] - 73 y.o. male MRN [id number]  Date of birth: Dec 09, 1950  Office Visit Note: Visit Date: 11/25/2023 PCP: Xenia Devonshire, MD Referred by: Xenia Devonshire, MD  Subjective: Chief Complaint  Patient presents with   Neck - Pain   HPI:  Dustin [name] is a 73 y.o. male who comes in today at the request of Dr. JUDITHANN Glendia Hutchinson for planned Left C7-T1 Cervical Interlaminar epidural steroid injection with fluoroscopic guidance.  The patient has failed conservative care including home exercise, medications, time and activity modification.  This injection will be diagnostic and hopefully therapeutic.  Please see requesting physician notes for further details and justification.   ROS Otherwise per HPI.  Assessment & Plan: Visit Diagnoses:    ICD-10-CM   1. Radiculopathy, cervical region  M54.12 XR C-ARM NO REPORT    Epidural Steroid injection    methylPREDNISolone  acetate (DEPO-MEDROL ) injection 40 mg      Plan: No additional findings.   Meds & Orders:  Meds ordered this encounter  Medications   methylPREDNISolone  acetate (DEPO-MEDROL ) injection 40 mg    Orders Placed This Encounter  Procedures   XR C-ARM NO REPORT   Epidural Steroid injection    Follow-up: Return for Dr. Georgina as scheduled.   Procedures: No procedures performed  Cervical Epidural Steroid Injection - Interlaminar Approach with Fluoroscopic Guidance  Patient: Dustin [name]      Date of Birth: 1951/02/04 MRN: [id number] PCP: Xenia Devonshire, MD      Visit Date: 11/25/2023   Universal Protocol:    Date/Time: 01/06/251:21 PM  Consent Given By: the patient  Position: PRONE  Additional Comments: Vital signs were monitored before and after the procedure. Patient was prepped and draped in the usual sterile fashion. The correct patient, procedure, and site was verified.   Injection Procedure Details:   Procedure diagnoses: Radiculopathy, cervical region [M54.12]    Meds Administered:  Meds ordered this  encounter  Medications   methylPREDNISolone  acetate (DEPO-MEDROL ) injection 40 mg     Laterality: Left  Location/Site: C7-T1  Needle: 3.5 in., 20 ga. Tuohy  Needle Placement: Paramedian epidural space  Findings:  -Comments: Excellent flow of contrast into the epidural space.  Procedure Details: Using a paramedian approach from the side mentioned above, the region overlying the inferior lamina was localized under fluoroscopic visualization and the soft tissues overlying this structure were infiltrated with 4 ml. of 1% Lidocaine  without Epinephrine. A # 20 gauge, Tuohy needle was inserted into the epidural space using a paramedian approach.  The epidural space was localized using loss of resistance along with contralateral oblique bi-planar fluoroscopic views.  After negative aspirate for air, blood, and CSF, a 2 ml. volume of Isovue -250 was injected into the epidural space and the flow of contrast was observed. Radiographs were obtained for documentation purposes.   The injectate was administered into the level noted above.  Additional Comments:  No complications occurred Dressing: 2 x 2 sterile gauze and Band-Aid    Post-procedure details: Patient was observed during the procedure. Post-procedure instructions were reviewed.  Patient left the clinic in stable condition.   Clinical History: EXAM: MRI LUMBAR SPINE WITHOUT CONTRAST   TECHNIQUE: Multiplanar, multisequence MR imaging of the lumbar spine was performed. No intravenous contrast was administered.   COMPARISON:  10/16/2011   FINDINGS: Segmentation:  5 lumbar type vertebral bodies.   Alignment: 11 mm of anterolisthesis at L4-5, increased from 7 mm on the study of 20/12.   Vertebrae: No  fracture or primary bone lesion. Chronic discogenic endplate marrow changes at T11-12, L2-3 and L4-5. No active edematous change. Insignificant hemangioma within the right posterior corner of the T11 vertebral body.   Conus  medullaris and cauda equina: Conus extends to the T12-L1 level. Conus and cauda equina appear normal.   Paraspinal and other soft tissues: Negative   Disc levels:   T11-12: Disc degeneration with mild disc bulge. No compressive stenosis.   T12-L1 and L1-2: Normal.   L2-3: Desiccation and moderate bulging of the disc. Mild facet and ligamentous prominence. Mild stenosis of both lateral recesses and foramina but without definite neural compression. Slight worsening since 2012.   L3-4: Moderate bulging of the disc. Mild facet and ligamentous hypertrophy. Mild stenosis of the lateral recesses and foramina but without definite neural compression. Slight worsening since 2012.   L4-5: Chronic facet arthropathy, with 11 mm of anterolisthesis. Chronic disc degeneration with loss of disc height. Severe multifactorial stenosis of the canal and neural foramina likely to cause neural compression. Considerable worsening since 2012.   L5-S1: Mild bulging of the disc.  No canal or foraminal stenosis.   IMPRESSION: 1. L4-5: Severe multifactorial spinal stenosis likely to cause neural compression. Considerable worsening since 2012. Contributing factors include facet arthropathy with 11 mm of anterolisthesis and bulging of the disc. 2. L2-3 and L3-4: Moderate disc bulges. Mild facet and ligamentous hypertrophy. Mild stenosis of the lateral recesses and foramina but without definite neural compression. Slight worsening since 2012.     Electronically Signed   By: Oneil Officer M.D.   On: 11/28/2020 15:54     Objective:  VS:  HT:5' 10.5 (179.1 cm)   WT:208 lb (94.3 kg)  BMI:29.41    BP:(!) 144/84  HR:77bpm  TEMP: ( )  RESP:  Physical Exam Vitals and nursing note reviewed.  Constitutional:      General: He is not in acute distress.    Appearance: Normal appearance. He is not ill-appearing.  HENT:     Head: Normocephalic and atraumatic.     Right Ear: External ear normal.     Left  Ear: External ear normal.  Eyes:     Extraocular Movements: Extraocular movements intact.  Cardiovascular:     Rate and Rhythm: Normal rate.     Pulses: Normal pulses.  Abdominal:     General: There is no distension.     Palpations: Abdomen is soft.  Musculoskeletal:        General: No signs of injury.     Cervical back: Neck supple. Tenderness present. No rigidity.     Right lower leg: No edema.     Left lower leg: No edema.     Comments: Patient has good strength in the upper extremities with 5 out of 5 strength in wrist extension long finger flexion APB.  No intrinsic hand muscle atrophy.  Negative Hoffmann's test.  Lymphadenopathy:     Cervical: No cervical adenopathy.  Skin:    Findings: No erythema or rash.  Neurological:     General: No focal deficit present.     Mental Status: He is alert and oriented to person, place, and time.     Sensory: No sensory deficit.     Motor: No weakness or abnormal muscle tone.     Coordination: Coordination normal.  Psychiatric:        Mood and Affect: Mood normal.        Behavior: Behavior normal.      Imaging: XR  C-ARM NO REPORT Result Date: 11/25/2023 Please see Notes tab for imaging impression.

## 2023-11-25 NOTE — Procedures (Signed)
 Cervical Epidural Steroid Injection - Interlaminar Approach with Fluoroscopic Guidance  Patient: Dustin [name]      Date of Birth: July 29, 1951 MRN: [id number] PCP: Xenia Devonshire, MD      Visit Date: 11/25/2023   Universal Protocol:    Date/Time: 01/06/251:21 PM  Consent Given By: the patient  Position: PRONE  Additional Comments: Vital signs were monitored before and after the procedure. Patient was prepped and draped in the usual sterile fashion. The correct patient, procedure, and site was verified.   Injection Procedure Details:   Procedure diagnoses: Radiculopathy, cervical region [M54.12]    Meds Administered:  Meds ordered this encounter  Medications   methylPREDNISolone  acetate (DEPO-MEDROL ) injection 40 mg     Laterality: Left  Location/Site: C7-T1  Needle: 3.5 in., 20 ga. Tuohy  Needle Placement: Paramedian epidural space  Findings:  -Comments: Excellent flow of contrast into the epidural space.  Procedure Details: Using a paramedian approach from the side mentioned above, the region overlying the inferior lamina was localized under fluoroscopic visualization and the soft tissues overlying this structure were infiltrated with 4 ml. of 1% Lidocaine  without Epinephrine. A # 20 gauge, Tuohy needle was inserted into the epidural space using a paramedian approach.  The epidural space was localized using loss of resistance along with contralateral oblique bi-planar fluoroscopic views.  After negative aspirate for air, blood, and CSF, a 2 ml. volume of Isovue -250 was injected into the epidural space and the flow of contrast was observed. Radiographs were obtained for documentation purposes.   The injectate was administered into the level noted above.  Additional Comments:  No complications occurred Dressing: 2 x 2 sterile gauze and Band-Aid    Post-procedure details: Patient was observed during the procedure. Post-procedure instructions were reviewed.  Patient  left the clinic in stable condition.

## 2023-12-19 ENCOUNTER — Ambulatory Visit: Payer: Medicare HMO | Admitting: Orthopedic Surgery

## 2023-12-19 DIAGNOSIS — M5412 Radiculopathy, cervical region: Secondary | ICD-10-CM | POA: Diagnosis not present

## 2023-12-19 NOTE — Progress Notes (Signed)
Orthopedic Spine Surgery Office Note   Assessment: Patient is a 73 y.o. male who I have been previously following for painless weakness in his left upper extremity.  He subsequently developed pain ended within the last 2 months has developed progressive weakness in that left upper extremity.  Has multiple levels with foraminal stenosis     Plan: -Patient has tried PT, activity modification, home strengthening, gabapentin, Lyrica, cervical ESI -Patient still does not have any symptoms or signs of myelopathy.  However, he has developed progressive weakness in the left upper extremity and is noticing difficulty with doing everyday tasks like brushing his teeth as result of this weakness.  As a result, discussed surgery as a treatment option for him.  His wife is currently recovering from rotator cuff surgery and he needs to help her.  He does not want to proceed at this time but wants to reconvene in about a month after she has recovered some to go over things further -Patient should return to office in 1 month, x-rays at next visit: AP/lateral/flex/ex cervical     Patient expressed understanding of the plan and all questions were answered to the patient's satisfaction.    __________________________________________________________________________   History: Patient is a 73 y.o. male who has been followed in the office for some time for painless left upper extremity weakness.  He has had weakness in his biceps and deltoids.  His weakness have been stable for a long time and he was able to function with that level of weakness.  He was not interested in any kind of surgical management given that degree of weakness.  More recently, he has developed pain that starts in his neck and radiates into his left trapezius and shoulder to the level of the deltoid insertion.  It does not radiate past that point.  He has no pain radiating into his right upper extremity.  In the last 2 months, he has noticed progressive  weakness in his left upper extremity.  He now cannot abduct or forward flex his arm.  He brushes his teeth with his left arm and has had difficulty with even that task as a result of the weakness.  He has not noticed any weakness in the right upper extremity.  He has no bowel or bladder incontinence.  No saddle anesthesia.  No trouble with fine motor skills of the hand.  No unsteadiness or imbalance.    Previous treatments: PT, activity modification, home strengthening, gabapentin, Lyrica, cervical ESI     Physical Exam:   General: no acute distress, appears stated age Neurologic: alert, answering questions appropriately, following commands Respiratory: unlabored breathing on room air, symmetric chest rise Psychiatric: appropriate affect, normal cadence to speech     MSK (spine):   -Strength exam                                                   Left                  Right Grip strength                5/5                  5/5 Interosseus                  5/5  5/5 Wrist extension            5/5                  5/5 Wrist flexion                 5/5                  5/5 Elbow flexion                4/5                5/5 Deltoid                          1/5                  5/5   EHL                              4/5                  4/5 TA                                 5/5                  5/5 GSC                             5/5                  5/5 Knee extension            5/5                  5/5 Hip flexion                    5/5                  5/5   -Sensory exam                           Sensation intact to light touch in L3-S1 nerve distributions of bilateral lower extremities             Sensation intact to light touch in C5-T1 nerve distributions of bilateral upper extremities     -Hoffman sign: Negative bilaterally -Clonus: No beats bilaterally -Interosseous wasting: None seen -Grip and release test: Negative -Gait: Normal -Negative Romberg    -Brachioradialis DTR: 1/4 on the left, 1/4 on the right -Biceps DTR: 1/4 on the left, 1/4 on the right Triceps DTR: 1/4 on the left, 1/4 on the right -Achilles DTR: 1/4 on the left, 1/4 on the right -Patellar tendon DTR: 2/4 on the left, 2/4 on the right   Imaging: XRs of the cervical spine from 12/05/2022 and 12/202/2023 were previously independently reviewed and interpreted, showing disc height loss at C3/4, C4/5, C5/6, C6/7. Lordotic alignment. T1 slope of 33 degrees. C2 slope of 12 degrees. Spondylolisthesis at C2/3 measures 2.53mm on flexion and reduces on extension.   MRI of the cervical spine from 12/04/2022 was previously independently reviewed and interpreted, showing central and foraminal stenosis bilaterally at C3/4. Central and foraminal stenosis bilaterally at C4/5. Bilateral  foraminal stenosis at C5/6. Right sided foraminal stenosis at C6/7. DDD at C3/4, C4/5, C5/6, C6/7.     Patient name: Dustin [name] Patient MRN: [id number] Date of visit: 12/19/23

## 2024-01-13 ENCOUNTER — Ambulatory Visit: Payer: Medicare HMO | Admitting: Orthopedic Surgery

## 2024-02-03 ENCOUNTER — Ambulatory Visit (INDEPENDENT_AMBULATORY_CARE_PROVIDER_SITE_OTHER): Payer: Medicare HMO | Admitting: Orthopedic Surgery

## 2024-02-03 ENCOUNTER — Other Ambulatory Visit (INDEPENDENT_AMBULATORY_CARE_PROVIDER_SITE_OTHER): Payer: Self-pay

## 2024-02-03 DIAGNOSIS — M5412 Radiculopathy, cervical region: Secondary | ICD-10-CM

## 2024-02-03 DIAGNOSIS — M542 Cervicalgia: Secondary | ICD-10-CM

## 2024-02-03 NOTE — Progress Notes (Signed)
 Orthopedic Spine Surgery Office Note   Assessment: Patient is a 73 y.o. male who I had been following for painless weakness in his left upper extremity. His weakness has recently become progressive and has developed radicular type pain     Plan: -Patient has tried PT, activity modification, home strengthening, gabapentin, Lyrica, cervical ESI -Since patient has been having progressive weakness in his left upper extremity, discussed surgery as a treatment option. Given his distribution of weakness with the deltoids and biceps effected, I suspect his symptoms are coming from the C4 and C5 nerves. He has foraminal stenosis at all those level so discussed a C3-5 ACDF with him. After this conversation, patient elected to proceed.  -Patient will next be seen at date of surgery     The patient has cervical radiculopathy with progressive weakness developing in his leg arm so discussed operative management. Discussed surgery in the form of C3-5 anterior cervical discectomy and fusion. The risks, including but not limited to pseudarthrosis, dysphagia, hematoma, airway compromise, recurrent laryngeal nerve injury, esophageal perforation, durotomy, spinal cord injury, nerve root injury, persistent pain, adjacent segment disease, infection, bleeding, hardware failure, vascular injury, heart attack, death, stroke, fracture, and need for additional procedures were discussed with the patient. The benefit of surgery would be improvement in the patient's radiating arm pain and would prevent him from developing worsening weakness. Surgery may even allow for recovery of his neurologic deficit but it will take time to see. Explained that the patient may not get full relief of his pain with this surgery, especially any neck pain. The alternatives to surgical management would be continued monitoring, physical therapy, over-the-counter pain medications, injections, traction, and activity modification. With his progressive  weakness though, I recommended against further conservative treatment. All the patient's questions were answered to his satisfaction. After this discussion, the patient expressed understanding and elected to proceed with surgical intervention.      __________________________________________________________________________   History: Patient is a 73 y.o. male who has been followed in the office for some time for painless left upper extremity weakness. At our last visit he was complaining of pain in is neck that goes into his trapezius and shoulder to the level of the deltoid. He also had been noticing progressive weakness with his deltoids and biceps in the left arm. Surgery was discussed given the progressive weakness but he wanted to wait as his wife was recovering from rotator cuff surgery. He comes in today to discuss further. He has no noticed any changes in his symptoms since our last visit.    Previous treatments: PT, activity modification, home strengthening, gabapentin, Lyrica, cervical ESI     Physical Exam:   General: no acute distress, appears stated age Neurologic: alert, answering questions appropriately, following commands Respiratory: unlabored breathing on room air, symmetric chest rise Psychiatric: appropriate affect, normal cadence to speech     MSK (spine):   -Strength exam                                                   Left                  Right Grip strength                5/5  5/5 Interosseus                  5/5                  5/5 Wrist extension            5/5                  5/5 Wrist flexion                 5/5                  5/5 Elbow flexion                4/5                5/5 Deltoid                          1/5                  5/5   EHL                              4/5                  4/5 TA                                 5/5                  5/5 GSC                             5/5                  5/5 Knee extension             5/5                  5/5 Hip flexion                    5/5                  5/5   -Sensory exam                           Sensation intact to light touch in L2-S1 nerve distributions of bilateral lower extremities             Sensation intact to light touch in C4-T1 nerve distributions of bilateral upper extremities     -Hoffman sign: Negative bilaterally -Clonus: No beats bilaterally -Interosseous wasting: None seen -Grip and release test: Negative -Gait: Normal -Negative Romberg   -Brachioradialis DTR: 1/4 on the left, 1/4 on the right -Biceps DTR: 1/4 on the left, 1/4 on the right Triceps DTR: 1/4 on the left, 1/4 on the right -Achilles DTR: 1/4 on the left, 1/4 on the right -Patellar tendon DTR: 2/4 on the left, 2/4 on the right   Imaging: XRs of the cervical spine from 02/03/2024 were independently reviewed and interpreted, showing disc height loss at C3/4, C4/5, C5/6, C6/7. Small anterior osteophyte formation seen at those level as well. Spondylolisthesis seen at C2/3 that shifts about 2.69mm between flexion  and extension views. No fracture or dislocation seen.    MRI of the cervical spine from 12/04/2022 was previously independently reviewed and interpreted, showing central and foraminal stenosis bilaterally at C3/4. Central and foraminal stenosis bilaterally at C4/5. Bilateral foraminal stenosis at C5/6. Right sided foraminal stenosis at C6/7. DDD at C3/4, C4/5, C5/6, C6/7.     Patient name: Dustin [name] Patient MRN: [id number] Date of visit: 02/03/24  Pre-operative Scores  NDI: 24% VAS arm: 7 VAS neck: 2

## 2024-02-12 ENCOUNTER — Encounter: Payer: Self-pay | Admitting: Orthopedic Surgery

## 2024-02-24 NOTE — Progress Notes (Signed)
 Surgical Instructions   Your procedure is scheduled on Tuesday, April 22, 25. Report to [facility] Main Entrance "A" at 5:30 A.M., then check in with the Admitting office. Any questions or running late day of surgery: call [phone]  Questions prior to your surgery date: call [phone], Monday-Friday, 8am-4pm. If you experience any cold or flu symptoms such as cough, fever, chills, shortness of breath, etc. between now and your scheduled surgery, please notify us at the above number.     Remember:  Do not eat after midnight the night before your surgery  You may drink clear liquids until 4:30 the morning of your surgery.   Clear liquids allowed are: Water, Non-Citrus Juices (without pulp), Carbonated Beverages, Clear Tea (no milk, honey, etc.), Black Coffee Only (NO MILK, CREAM OR POWDERED CREAMER of any kind), and Gatorade.    Patient Instructions  The night before surgery:  No food after midnight. ONLY clear liquids after midnight  The day of surgery (if you do NOT have diabetes):  Drink ONE (1) Pre-Surgery Clear Ensure by 4:30 the morning of surgery. Drink in one sitting. Do not sip.  This drink was given to you during your hospital pre-op appointment visit.  Nothing else to drink after completing the Pre-Surgery Clear Ensure.         If you have questions, please contact your surgeon's office.   Take these medicines the morning of surgery with A SIP OF WATER  atorvastatin (LIPITOR)  esomeprazole (NEXIUM)  levothyroxine (SYNTHROID, LEVOTHROID)  metoprolol succinate (TOPROL-XL)    May take these medicines IF NEEDED: albuterol (PROVENTIL) nebulizer   Follow your surgeon's instructions on when to stop Asprin.  If no instructions were given by your surgeon then you will need to call the office to get those instructions.      One week prior to surgery, STOP taking any Aleve, Naproxen, Ibuprofen, Motrin, Advil, Goody's, BC's, all herbal medications, fish oil, and  non-prescription vitamins.                     Do NOT Smoke (Tobacco/Vaping) for 24 hours prior to your procedure.  If you use a CPAP at night, you may bring your mask/headgear for your overnight stay.   You will be asked to remove any contacts, glasses, piercing's, hearing aid's, dentures/partials prior to surgery. Please bring cases for these items if needed.    Patients discharged the day of surgery will not be allowed to drive home, and someone needs to stay with them for 24 hours.  SURGICAL WAITING ROOM VISITATION Patients may have no more than 2 support people in the waiting area - these visitors may rotate.   Pre-op nurse will coordinate an appropriate time for 1 ADULT support person, who may not rotate, to accompany patient in pre-op.  Children under the age of 38 must have an adult with them who is not the patient and must remain in the main waiting area with an adult.  If the patient needs to stay at the hospital during part of their recovery, the visitor guidelines for inpatient rooms apply.  Please refer to the [facility] website for the visitor guidelines for any additional information.   If you received a COVID test during your pre-op visit  it is requested that you wear a mask when out in public, stay away from anyone that may not be feeling well and notify your surgeon if you develop symptoms. If you have been in contact with anyone  that has tested positive in the last 10 days please notify you surgeon.      Pre-operative 5 CHG Bathing Instructions   You can play a key role in reducing the risk of infection after surgery. Your skin needs to be as free of germs as possible. You can reduce the number of germs on your skin by washing with CHG (chlorhexidine gluconate) soap before surgery. CHG is an antiseptic soap that kills germs and continues to kill germs even after washing.   DO NOT use if you have an allergy to chlorhexidine/CHG or antibacterial soaps. If your skin  becomes reddened or irritated, stop using the CHG and notify one of our RNs at [phone].   Please shower with the CHG soap starting 4 days before surgery using the following schedule:     Please keep in mind the following:  DO NOT shave, including legs and underarms, starting the day of your first shower.   You may shave your face at any point before/day of surgery.  Place clean sheets on your bed the day you start using CHG soap. Use a clean washcloth (not used since being washed) for each shower. DO NOT sleep with pets once you start using the CHG.   CHG Shower Instructions:  Wash your face and private area with normal soap. If you choose to wash your hair, wash first with your normal shampoo.  After you use shampoo/soap, rinse your hair and body thoroughly to remove shampoo/soap residue.  Turn the water OFF and apply about 3 tablespoons (45 ml) of CHG soap to a CLEAN washcloth.  Apply CHG soap ONLY FROM YOUR NECK DOWN TO YOUR TOES (washing for 3-5 minutes)  DO NOT use CHG soap on face, private areas, open wounds, or sores.  Pay special attention to the area where your surgery is being performed.  If you are having back surgery, having someone wash your back for you may be helpful. Wait 2 minutes after CHG soap is applied, then you may rinse off the CHG soap.  Pat dry with a clean towel  Put on clean clothes/pajamas   If you choose to wear lotion, please use ONLY the CHG-compatible lotions that are listed below.  Additional instructions for the day of surgery: DO NOT APPLY any lotions, deodorants, cologne, or perfumes.   Do not bring valuables to the hospital. [facility] is not responsible for any belongings/valuables. Do not wear nail polish, gel polish, artificial nails, or any other type of covering on natural nails (fingers and toes) Do not wear jewelry or makeup Put on clean/comfortable clothes.  Please brush your teeth.  Ask your nurse before applying any prescription  medications to the skin.     CHG Compatible Lotions   Aveeno Moisturizing lotion  Cetaphil Moisturizing Cream  Cetaphil Moisturizing Lotion  Clairol Herbal Essence Moisturizing Lotion, Dry Skin  Clairol Herbal Essence Moisturizing Lotion, Extra Dry Skin  Clairol Herbal Essence Moisturizing Lotion, Normal Skin  Curel Age Defying Therapeutic Moisturizing Lotion with Alpha Hydroxy  Curel Extreme Care Body Lotion  Curel Soothing Hands Moisturizing Hand Lotion  Curel Therapeutic Moisturizing Cream, Fragrance-Free  Curel Therapeutic Moisturizing Lotion, Fragrance-Free  Curel Therapeutic Moisturizing Lotion, Original Formula  Eucerin Daily Replenishing Lotion  Eucerin Dry Skin Therapy Plus Alpha Hydroxy Crme  Eucerin Dry Skin Therapy Plus Alpha Hydroxy Lotion  Eucerin Original Crme  Eucerin Original Lotion  Eucerin Plus Crme Eucerin Plus Lotion  Eucerin TriLipid Replenishing Lotion  Keri Anti-Bacterial Hand Lotion  Keri  Deep Conditioning Original Lotion Dry Skin Formula Softly Scented  Keri Deep Conditioning Original Lotion, Fragrance Free Sensitive Skin Formula  Keri Lotion Fast Absorbing Fragrance Free Sensitive Skin Formula  Keri Lotion Fast Absorbing Softly Scented Dry Skin Formula  Keri Original Lotion  Keri Skin Renewal Lotion Keri Silky Smooth Lotion  Keri Silky Smooth Sensitive Skin Lotion  Nivea Body Creamy Conditioning Oil  Nivea Body Extra Enriched Lotion  Nivea Body Original Lotion  Nivea Body Sheer Moisturizing Lotion Nivea Crme  Nivea Skin Firming Lotion  NutraDerm 30 Skin Lotion  NutraDerm Skin Lotion  NutraDerm Therapeutic Skin Cream  NutraDerm Therapeutic Skin Lotion  ProShield Protective Hand Cream  Provon moisturizing lotion  Please read over the following fact sheets that you were given.

## 2024-02-25 ENCOUNTER — Encounter (HOSPITAL_COMMUNITY)
Admission: RE | Admit: 2024-02-25 | Discharge: 2024-02-25 | Disposition: A | Discharge status: Home / Self Care | Source: Ambulatory Visit | Attending: Orthopedic Surgery | Admitting: Orthopedic Surgery

## 2024-02-25 ENCOUNTER — Other Ambulatory Visit: Payer: Self-pay

## 2024-02-25 ENCOUNTER — Encounter (HOSPITAL_COMMUNITY): Payer: Self-pay

## 2024-02-25 DIAGNOSIS — Z01818 Encounter for other preprocedural examination: Secondary | ICD-10-CM | POA: Diagnosis present

## 2024-02-25 HISTORY — DX: Unspecified osteoarthritis, unspecified site: M19.90

## 2024-02-25 HISTORY — DX: Nonrheumatic aortic (valve) insufficiency: I35.1

## 2024-02-25 LAB — TYPE AND SCREEN
ABO/RH(D): A NEG
Antibody Screen: NEGATIVE

## 2024-02-25 LAB — BASIC METABOLIC PANEL WITH GFR
Anion gap: 10 (ref 5–15)
BUN: 15 mg/dL (ref 8–23)
CO2: 23 mmol/L (ref 22–32)
Calcium: 9.7 mg/dL (ref 8.9–10.3)
Chloride: 102 mmol/L (ref 98–111)
Creatinine, Ser: 1.37 mg/dL — ABNORMAL HIGH (ref 0.61–1.24)
GFR, Estimated: 55 mL/min — ABNORMAL LOW (ref >60)
Glucose, Bld: 100 mg/dL — ABNORMAL HIGH (ref 70–99)
Potassium: 4.6 mmol/L (ref 3.5–5.1)
Sodium: 135 mmol/L (ref 135–145)

## 2024-02-25 LAB — CBC
HCT: 37.8 % — ABNORMAL LOW (ref 39.0–52.0)
Hemoglobin: 12.5 g/dL — ABNORMAL LOW (ref 13.0–17.0)
MCH: 29.3 pg (ref 26.0–34.0)
MCHC: 33.1 g/dL (ref 30.0–36.0)
MCV: 88.7 fL (ref 80.0–100.0)
Platelets: 133 10*3/uL — ABNORMAL LOW (ref 150–400)
RBC: 4.26 MIL/uL (ref 4.22–5.81)
RDW: 14.8 % (ref 11.5–15.5)
WBC: 6.2 10*3/uL (ref 4.0–10.5)
nRBC: 0 % (ref 0.0–0.2)

## 2024-02-25 LAB — SURGICAL PCR SCREEN
MRSA, PCR: NEGATIVE
Staphylococcus aureus: NEGATIVE

## 2024-02-25 NOTE — Progress Notes (Signed)
 PCP - Burnadette Peter, MD  Cardiologist - Threasa Alpha, MD    PPM/ICD - denies Device Orders - n/a Rep Notified - n/a  Chest x-ray - 12-2023 EKG - 02-25-24 Stress Test - years ago ECHO - 01-22-24 Cardiac Cath - 04-2017  Sleep Study - per patient years ago CPAP -   DM -denies  Blood Thinner Instructions: denies Aspirin Instructions: per patient to stop asa on 03-02-24  ERAS Protcol - clear liquids until  PRE-SURGERY Ensure  COVID TEST-    Anesthesia review: yes cardiac hx, surgical clearance for PCP 02-17-24  Patient denies shortness of breath, fever, cough and chest pain at PAT appointment   All instructions explained to the patient, with a verbal understanding of the material. Patient agrees to go over the instructions while at home for a better understanding. Patient also instructed to self quarantine after being tested for COVID-19. The opportunity to ask questions was provided.

## 2024-02-26 ENCOUNTER — Encounter: Payer: Self-pay | Admitting: Orthopedic Surgery

## 2024-02-26 ENCOUNTER — Encounter (HOSPITAL_COMMUNITY): Payer: Self-pay

## 2024-02-26 NOTE — Progress Notes (Signed)
 Anesthesia Chart Review:  Case: [id number] Date/Time: 03/10/24 0715   Procedure: ANTERIOR CERVICAL DECOMPRESSION/DISCECTOMY FUSION 2 LEVELS - C3-5 ANTERIOR CERVICAL DISCECTOMY AND FUSION   Anesthesia type: General   Diagnosis: Cervical radiculopathy [M54.12]   Pre-op diagnosis: CERVICAL RADICULOPATHY   [facility]: [facility] ROOM [facility] / [facility] OR   Surgeons: London Sheer, MD       DISCUSSION: Patient is a 73 year old male scheduled for the above procedure.  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), PAF/flutter (s/p a-flutter ablation 11/01/17; "No indication for anticoagulant since successful surgical LAA closure with 13-month follow-up TEE showing continued closure and no stump."), hypothyroidism, GERD, spinal surgery (L4-5 posterolateral fusion 11/29/21).    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    PCP Selina Cooley, MD signed a note of medical clearance for surgery on 02/13/24 (scanned under Media tab).  Last cardiology visit with Dr. Jodie Echevaria was on 01/10/24. S/p MV Repair with SVG-CX in August 2018. Has had eccentric moderate AI that is a  jet onto anterior MV leaflet on July 2022 echo. Has had some fatigue. Zio monitor for near syncope in 2022 was unremarkable. 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 Chlorthalidone discontinued and Toprol XL 25 mg daily to see if that would help with fatigue. 08/2013 Holter moniotr showed predominant SR with HR 49-164, average 73 bpm, first degree AV block and BBB/IVCD, 6 beats of SVT, 4.9% isolated SVE, < 1.0% VEs. Updated echocardiogram  done on 01/22/24 showed showed LVEF 55-60%, normal LV wall motion, moderately dilated RV, normal RV systolic function, moderate to severe grade III-IV (maging  challenging, but PHT 234  msec and descending aorta end diastolic reversal velocity of 47.8 cm-sec just under the cutoff of 20 cm-sec, recommend cardiac MRI for further quantitation if clinically appropriate), stable MV annuloplasty ring with mild MR, RVSP 25 mmHg.  Given cardiac history would advise preoperative cardiology input. April at Dr. Kathi Der office noted. He reported last aspirin scheduled for 03/02/2024.  ADDENDUM 02/28/24 6:28 PM: Preoperative cardiac risk assessment received from Dr. Jodie Echevaria dated 02/26/2024.  RCRI score 3: Intermediate risk. Functional capacity > 4 METS (20 minutes weight training, 45 minutes treadmill). LVEF 55-60% 01/22/24. He added, " no further cardiac testing is warranted.  Patient should continue beta-blocker throughout surgery as clinically able if palpable." Reviewed with anesthesiologist Val Eagle, MD. Anesthesia team to evaluate on the day of surgery.    VS: BP 104/67   Pulse 85   Temp 36.4 C   Resp 18   Ht 5\' 11"  (1.803 m)   Wt 94.8 kg   SpO2 100%   BMI 29.15 kg/m    PROVIDERS: Selina Cooley, MD is PCP ([facility]) Jodie Echevaria Osie Cheeks, MD is cardiologist ([facility])   LABS: Labs reviewed: Acceptable for surgery. (all labs ordered are listed, but only abnormal results are displayed)  Labs Reviewed  CBC - Abnormal; Notable for the following components:      Result Value   Hemoglobin 12.5 (*)    HCT 37.8 (*)    Platelets 133 (*)    All other components within normal limits  BASIC METABOLIC PANEL WITH GFR - Abnormal; Notable for the following components:   Glucose, Bld 100 (*)    Creatinine, Ser 1.37 (*)    GFR,  Estimated 55 (*)    All other components within normal limits  SURGICAL PCR SCREEN  TYPE AND SCREEN    IMAGES: CXR 01/15/24 ([facility]): FINDINGS:  - Cardiomediastinal silhouette within normal limits in size and  contour. No evidence of central vascular congestion. No interlobular  septal thickening.  - Surgical changes  of median sternotomy and mitral annuloplasty  - Asymmetric elevation of the left hemidiaphragm  - No pneumothorax or pleural effusion. Coarsened interstitial  markings, with no confluent airspace disease.  - No acute displaced fracture. Degenerative changes of the spine.  IMPRESSION: Negative for acute cardiopulmonary disease.    EKG: EKG 02/25/24 Normal sinus rhythm with sinus arrhythmia Cannot rule out Anterior infarct , age undetermined - Known first degree AV block (PR "Was 242 msec even in 2018")   CV: Echo 01/22/24 ([facility]): SUMMARY  The left ventricle is moderately dilated [55ml-m2] with normal left ventricular wall thickness,  normal wall motion and normal systolic function.  LV ejection fraction = 55-60%.  Normal LV Global L Strain =-21.1%.  The right ventricle is moderately dilated.  The right ventricular systolic function is normal.  The atria are mildly dilated.  There is moderate to severe [Grade III-IV] aortic regurgitation. Imaging is challenging, but PHT 234  msec and descending aorta end diastolic reversal velocity of 16.1 cm-sec is just under the cutoff of  20 cm-sec. Recommend cardiac MRI for further quantitation if clinically appropriate.  Stable annuloplasty ring is noted in the mitral position with mean gradient across the mitral valve  is 5 mmHg at heart rate 61 BPM.  There is mild mitral regurgitation.  No pulmonary hypertension.  Estimated right ventricular systolic pressure is 25 mmHg.  Normal IVC size and collapsibility; Right atrial pressure is estimated to be 3 mm Hg.  The aortic sinus is normal size.  About the same as last year.  - Comparison 05/31/21: LVEF 55-60%, moderate AR, s/p MV repair with annuloplasty ring, inflow gradient 4 mmHg at HR 55 bpm, mild MR   8-14 day Holter monitor 08/26/23 ([facility]): Patient had a min HR of 49 bpm, max HR of 164 bpm, and avg HR of 73 bpm. Predominant underlying rhythm was Sinus Rhythm. First Degree AV Block  was present. Bundle Branch Block-IVCD was present. 1 run of Supraventricular Tachycardia occurred lasting 6 beats with a max rate of 164 bpm  avg 132 bpm . Isolated SVEs were occasional  4.9%, 70956 , SVE Couplets were occasional  1.7%, 12062 , and SVE Triplets were rare  <1.0%, 773 . Isolated VEs were rare  <1.0%, 3070 , VE Couplets were rare  <1.0%, 14 , and VE Triplets were rare  <1.0%, 1 . Ventricular Trigeminy was present.  - No significant symptoms or triggered events noted.  No significant arrhythmia recorded.     Cardiac cath 05/16/17 ([facility]): Coronary Findings Diagnostic Dominance: Right Left Main: LM lesion, 10% stenosed. The lesion is calcified.  Left Anterior Descending: Prox LAD lesion, 10% stenosed. The lesion is irregular.  Left Circumflex: Ost Cx lesion, 60% stenosed. Prox Cx to Mid Cx lesion, 65% stenosed. The lesion is moderately calcified.  Right Coronary Artery: Prox RCA lesion, 30% stenosed. Mid RCA lesion, 45% stenosed.   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   Hypertension    Hypothyroidism 07/01/2018   Paroxysmal A-fib (HCC)    S/P mitral valve repair 07/08/2017   Spinal stenosis of lumbar region 08/22/2021    Past Surgical History:  Procedure Laterality Date   CARDIAC CATHETERIZATION  05/14/2017   hx ablation for A-flutter  11/01/2017   MITRAL VALVE REPAIR (MV)/CORONARY ARTERY BYPASS GRAFTING (CABG)  07/08/2017   SVG-OM, LAA closure, MV reconstruction with annuloplasty and sliding plasty via superior septal approach/Medtronic Simplici-T Annuloplasty System, left GSV harvest 07/08/17 by Duanne Limerick, MD at [facility]   TONSILLECTOMY     removed as a child    MEDICATIONS:  albuterol (PROVENTIL) (2.5 MG/3ML) 0.083% nebulizer solution   aspirin  EC 81 MG tablet   atorvastatin (LIPITOR) 80 MG tablet   Cholecalciferol 125 MCG (5000 UT) TABS   esomeprazole (NEXIUM) 40 MG capsule   levothyroxine (SYNTHROID, LEVOTHROID) 50 MCG tablet   losartan (COZAAR) 100 MG tablet   metoprolol succinate (TOPROL-XL) 25 MG 24 hr tablet   sildenafil (REVATIO) 20 MG tablet   No current facility-administered medications for this encounter.    Shonna Chock, PA-C Surgical Short Stay/Anesthesiology [facility] Phone [phone] [facility] Phone [phone] 02/26/2024 7:15 PM

## 2024-02-28 NOTE — Anesthesia Preprocedure Evaluation (Addendum)
 Anesthesia Evaluation  Patient identified by MRN, date of birth, ID band Patient awake    Reviewed: Allergy & Precautions, NPO status , Patient's Chart, lab work & pertinent test results  Airway Mallampati: II  TM Distance: >3 FB Neck ROM: Full    Dental no notable dental hx. (+) Dental Advisory Given, Teeth Intact   Pulmonary neg sleep apnea (per pt was negative study)   Pulmonary exam normal breath sounds clear to auscultation       Cardiovascular hypertension, Pt. on medications and Pt. on home beta blockers + CAD (s/p CABG) and + CABG (2018)  + dysrhythmias (s/p ablation, no A/C) Atrial Fibrillation + Valvular Problems/Murmurs (s/p CABG MVR 2018, mod AI, mild MR) AI and MR  Rhythm:Regular + Systolic murmurs Echo 05/31/21 ([facility]): SUMMARY  The left ventricular size is normal.  There is mild concentric left ventricular hypertrophy.  Left ventricular filling pattern is indeterminate.  LV ejection fraction = 55-60%.  The right ventricle is normal in size and function.  The left atrium is moderately dilated.  The right atrium is mildly dilated.  There is moderate aortic regurgitation.  There is an eccentric jet of aortic insufficiency directed against the  anterior mitral leaflet.  Status post mitral valve repair with annuloplasty ring; inflow  gradient 4 mmHg at a HR of 55 bpm.  There is mild mitral regurgitation.  The mitral regurgitant jet is eccentrically directed.  IVC size was normal.  There is no pericardial effusion.  There is no significant change in comparison with the last study.    Neuro/Psych negative neurological ROS  negative psych ROS   GI/Hepatic Neg liver ROS,GERD  Medicated and Controlled,,  Endo/Other  Hypothyroidism    Renal/GU Renal disease (Cr 1.32)  negative genitourinary   Musculoskeletal  (+) Arthritis , Osteoarthritis,    Abdominal   Peds negative pediatric ROS (+)   Hematology negative hematology ROS (+) hct 31.8, plt 140    Anesthesia Other Findings   Reproductive/Obstetrics negative OB ROS                             Anesthesia Physical Anesthesia Plan  ASA: 3  Anesthesia Plan: General   Post-op Pain Management:    Induction: Intravenous  PONV Risk Score and Plan: 2 and Ondansetron , Treatment may vary due to age or medical condition and Midazolam   Airway Management Planned: Oral ETT  Additional Equipment: None  Intra-op Plan:   Post-operative Plan: Extubation in OR  Informed Consent: I have reviewed the patients History and Physical, chart, labs and discussed the procedure including the risks, benefits and alternatives for the proposed anesthesia with the patient or authorized representative who has indicated his/her understanding and acceptance.     Dental advisory given  Plan Discussed with: CRNA  Anesthesia Plan Comments: (PAT note written by Allison Zelenak, PA-C. Classified as intermediate risk by [facility] cardiologist Dr. Drenda Gentle.  Echo 01/22/24 ([facility]): SUMMARY  The left ventricle is moderately dilated [70ml-m2] with normal left ventricular wall thickness,  normal wall motion and normal systolic function.  LV ejection fraction = 55-60%.  Normal LV Global L Strain =-21.1%.  The right ventricle is moderately dilated.  The right ventricular systolic function is normal.  The atria are mildly dilated.  There is moderate to severe [Grade III-IV] aortic regurgitation. Imaging is challenging, but PHT 234  msec and descending aorta end diastolic reversal velocity of 16.1 cm-sec is just under  the cutoff of  20 cm-sec. Recommend cardiac MRI for further quantitation if clinically appropriate.  Stable annuloplasty ring is noted in the mitral position with mean gradient across the mitral valve  is 5 mmHg at heart rate 61 BPM.  There is mild mitral regurgitation.  No pulmonary hypertension.  Estimated right  ventricular systolic pressure is 25 mmHg.  Normal IVC size and collapsibility; Right atrial pressure is estimated to be 3 mm Hg.  The aortic sinus is normal size.  About the same as last year.  - Comparison 05/31/21: LVEF 55-60%, moderate AR, s/p MV repair with annuloplasty ring, inflow gradient 4 mmHg at HR 55 bpm, mild MR     )        Anesthesia Quick Evaluation

## 2024-03-10 ENCOUNTER — Encounter (HOSPITAL_COMMUNITY): Payer: Self-pay | Admitting: Orthopedic Surgery

## 2024-03-10 ENCOUNTER — Ambulatory Visit (HOSPITAL_COMMUNITY)

## 2024-03-10 ENCOUNTER — Encounter (HOSPITAL_COMMUNITY)
Admission: RE | Disposition: A | Discharge status: Home / Self Care | Payer: Self-pay | Source: Ambulatory Visit | Attending: Orthopedic Surgery

## 2024-03-10 ENCOUNTER — Other Ambulatory Visit: Payer: Self-pay

## 2024-03-10 ENCOUNTER — Observation Stay (HOSPITAL_COMMUNITY)
Admission: RE | Admit: 2024-03-10 | Discharge: 2024-03-11 | Disposition: A | Discharge status: Home / Self Care | Source: Ambulatory Visit | Attending: Orthopedic Surgery | Admitting: Orthopedic Surgery

## 2024-03-10 ENCOUNTER — Ambulatory Visit (HOSPITAL_COMMUNITY): Payer: Self-pay | Admitting: Vascular Surgery

## 2024-03-10 ENCOUNTER — Ambulatory Visit (HOSPITAL_BASED_OUTPATIENT_CLINIC_OR_DEPARTMENT_OTHER): Admitting: Certified Registered Nurse Anesthetist

## 2024-03-10 DIAGNOSIS — I130 Hypertensive heart and chronic kidney disease with heart failure and stage 1 through stage 4 chronic kidney disease, or unspecified chronic kidney disease: Secondary | ICD-10-CM | POA: Diagnosis not present

## 2024-03-10 DIAGNOSIS — E039 Hypothyroidism, unspecified: Secondary | ICD-10-CM | POA: Diagnosis not present

## 2024-03-10 DIAGNOSIS — J449 Chronic obstructive pulmonary disease, unspecified: Secondary | ICD-10-CM | POA: Diagnosis not present

## 2024-03-10 DIAGNOSIS — I509 Heart failure, unspecified: Secondary | ICD-10-CM | POA: Diagnosis not present

## 2024-03-10 DIAGNOSIS — I2510 Atherosclerotic heart disease of native coronary artery without angina pectoris: Secondary | ICD-10-CM | POA: Diagnosis not present

## 2024-03-10 DIAGNOSIS — N189 Chronic kidney disease, unspecified: Secondary | ICD-10-CM | POA: Diagnosis not present

## 2024-03-10 DIAGNOSIS — I4891 Unspecified atrial fibrillation: Secondary | ICD-10-CM | POA: Diagnosis not present

## 2024-03-10 DIAGNOSIS — M5412 Radiculopathy, cervical region: Secondary | ICD-10-CM

## 2024-03-10 DIAGNOSIS — M4802 Spinal stenosis, cervical region: Secondary | ICD-10-CM | POA: Diagnosis present

## 2024-03-10 DIAGNOSIS — Z01818 Encounter for other preprocedural examination: Principal | ICD-10-CM

## 2024-03-10 HISTORY — PX: ANTERIOR CERVICAL DECOMP/DISCECTOMY FUSION: SHX1161

## 2024-03-10 SURGERY — ANTERIOR CERVICAL DECOMPRESSION/DISCECTOMY FUSION 2 LEVELS
Anesthesia: General

## 2024-03-10 MED ORDER — PHENYLEPHRINE 80 MCG/ML (10ML) SYRINGE FOR IV PUSH (FOR BLOOD PRESSURE SUPPORT)
PREFILLED_SYRINGE | INTRAVENOUS | Status: AC
  Filled 2024-03-10: qty 10

## 2024-03-10 MED ORDER — PROPOFOL 10 MG/ML IV BOLUS
INTRAVENOUS | Status: AC
  Filled 2024-03-10: qty 20

## 2024-03-10 MED ORDER — ORAL CARE MOUTH RINSE: 15.0000 mL | Freq: Once | OROMUCOSAL | Status: AC

## 2024-03-10 MED ORDER — FENTANYL CITRATE (PF) 250 MCG/5ML IJ SOLN
INTRAMUSCULAR | Status: AC
Start: 2024-03-10 — End: ?
  Filled 2024-03-10: qty 5

## 2024-03-10 MED ORDER — CEFAZOLIN SODIUM-DEXTROSE 2-4 GM/100ML-% IV SOLN
2.0000 g | INTRAVENOUS | Status: AC
  Administered 2024-03-10: 2 g via INTRAVENOUS
  Filled 2024-03-10: qty 100

## 2024-03-10 MED ORDER — TRANEXAMIC ACID-NACL 1000-0.7 MG/100ML-% IV SOLN
1000.0000 mg | INTRAVENOUS | Status: AC
  Administered 2024-03-10: 1000 mg via INTRAVENOUS
  Filled 2024-03-10: qty 100

## 2024-03-10 MED ORDER — BACITRACIN ZINC 500 UNIT/GM EX OINT
TOPICAL_OINTMENT | CUTANEOUS | Status: AC
  Filled 2024-03-10: qty 28.35

## 2024-03-10 MED ORDER — LACTATED RINGERS IV SOLN
INTRAVENOUS | Status: DC | PRN
Start: 2024-03-10 — End: 2024-03-10

## 2024-03-10 MED ORDER — DEXAMETHASONE SODIUM PHOSPHATE 10 MG/ML IJ SOLN
INTRAMUSCULAR | Status: AC
Start: 2024-03-10 — End: ?
  Filled 2024-03-10: qty 1

## 2024-03-10 MED ORDER — MIDAZOLAM HCL 2 MG/2ML IJ SOLN
INTRAMUSCULAR | Status: DC | PRN
  Administered 2024-03-10 (×2): 1 mg via INTRAVENOUS

## 2024-03-10 MED ORDER — LIDOCAINE 2% (20 MG/ML) 5 ML SYRINGE
INTRAMUSCULAR | Status: AC
  Filled 2024-03-10: qty 5

## 2024-03-10 MED ORDER — LIDOCAINE 2% (20 MG/ML) 5 ML SYRINGE
INTRAMUSCULAR | Status: DC | PRN
  Administered 2024-03-10: 100 mg via INTRAVENOUS

## 2024-03-10 MED ORDER — ONDANSETRON HCL 4 MG/2ML IJ SOLN
INTRAMUSCULAR | Status: DC | PRN
  Administered 2024-03-10: 4 mg via INTRAVENOUS

## 2024-03-10 MED ORDER — METOPROLOL SUCCINATE ER 25 MG PO TB24: 25.0000 mg | ORAL_TABLET | Freq: Every morning | ORAL | Status: DC

## 2024-03-10 MED ORDER — PROPOFOL 10 MG/ML IV BOLUS
INTRAVENOUS | Status: DC | PRN
  Administered 2024-03-10: 150 mg via INTRAVENOUS
  Administered 2024-03-10: 40 mg via INTRAVENOUS

## 2024-03-10 MED ORDER — TRANEXAMIC ACID-NACL 1000-0.7 MG/100ML-% IV SOLN
1000.0000 mg | Freq: Once | INTRAVENOUS | Status: AC
  Administered 2024-03-10: 1000 mg via INTRAVENOUS
  Filled 2024-03-10: qty 100

## 2024-03-10 MED ORDER — SENNA 8.6 MG PO TABS
1.0000 | ORAL_TABLET | Freq: Two times a day (BID) | ORAL | Status: DC
  Administered 2024-03-10: 8.6 mg via ORAL
  Filled 2024-03-10: qty 1

## 2024-03-10 MED ORDER — 0.9 % SODIUM CHLORIDE (POUR BTL) OPTIME
TOPICAL | Status: DC | PRN
  Administered 2024-03-10: 1000 mL

## 2024-03-10 MED ORDER — LOSARTAN POTASSIUM 50 MG PO TABS: 50.0000 mg | ORAL_TABLET | Freq: Every morning | ORAL | Status: DC

## 2024-03-10 MED ORDER — DEXAMETHASONE SODIUM PHOSPHATE 10 MG/ML IJ SOLN
10.0000 mg | Freq: Once | INTRAMUSCULAR | Status: AC
  Administered 2024-03-10: 10 mg via INTRAVENOUS
  Filled 2024-03-10: qty 1

## 2024-03-10 MED ORDER — HYDROMORPHONE HCL 1 MG/ML IJ SOLN
0.2500 mg | INTRAMUSCULAR | Status: DC | PRN
  Administered 2024-03-10: 0.5 mg via INTRAVENOUS
  Administered 2024-03-10: 0.25 mg via INTRAVENOUS

## 2024-03-10 MED ORDER — METHOCARBAMOL 500 MG PO TABS
500.0000 mg | ORAL_TABLET | Freq: Four times a day (QID) | ORAL | Status: DC
  Administered 2024-03-10 (×2): 500 mg via ORAL
  Filled 2024-03-10 (×2): qty 1

## 2024-03-10 MED ORDER — POVIDONE-IODINE 10 % EX SWAB
2.0000 | Freq: Once | CUTANEOUS | Status: AC
  Administered 2024-03-10: 2 via TOPICAL

## 2024-03-10 MED ORDER — ONDANSETRON HCL 4 MG PO TABS: 4.0000 mg | ORAL_TABLET | Freq: Four times a day (QID) | ORAL | Status: DC | PRN

## 2024-03-10 MED ORDER — ATORVASTATIN CALCIUM 80 MG PO TABS
80.0000 mg | ORAL_TABLET | Freq: Every morning | ORAL | Status: DC
Start: 2024-03-11 — End: 2024-03-11

## 2024-03-10 MED ORDER — PHENYLEPHRINE 80 MCG/ML (10ML) SYRINGE FOR IV PUSH (FOR BLOOD PRESSURE SUPPORT)
PREFILLED_SYRINGE | INTRAVENOUS | Status: DC | PRN
  Administered 2024-03-10: 30 ug via INTRAVENOUS
  Administered 2024-03-10: 50 ug via INTRAVENOUS

## 2024-03-10 MED ORDER — DEXAMETHASONE SODIUM PHOSPHATE 10 MG/ML IJ SOLN
INTRAMUSCULAR | Status: AC
  Filled 2024-03-10: qty 1

## 2024-03-10 MED ORDER — OXYCODONE HCL 5 MG PO TABS: 5.0000 mg | ORAL_TABLET | Freq: Once | ORAL | Status: DC | PRN

## 2024-03-10 MED ORDER — SUCCINYLCHOLINE CHLORIDE 200 MG/10ML IV SOSY
PREFILLED_SYRINGE | INTRAVENOUS | Status: AC
  Filled 2024-03-10: qty 10

## 2024-03-10 MED ORDER — MIDAZOLAM HCL 2 MG/2ML IJ SOLN
INTRAMUSCULAR | Status: AC
  Filled 2024-03-10: qty 2

## 2024-03-10 MED ORDER — ACETAMINOPHEN 500 MG PO TABS
1000.0000 mg | ORAL_TABLET | Freq: Three times a day (TID) | ORAL | Status: DC
  Administered 2024-03-10 – 2024-03-11 (×3): 1000 mg via ORAL
  Filled 2024-03-10 (×3): qty 2

## 2024-03-10 MED ORDER — PROPOFOL 500 MG/50ML IV EMUL
INTRAVENOUS | Status: DC | PRN
  Administered 2024-03-10 (×3): 125 ug/kg/min via INTRAVENOUS
  Administered 2024-03-10: 100 ug/kg/min via INTRAVENOUS
  Administered 2024-03-10: 125 ug/kg/min via INTRAVENOUS

## 2024-03-10 MED ORDER — ALBUMIN HUMAN 5 % IV SOLN: INTRAVENOUS | Status: DC | PRN

## 2024-03-10 MED ORDER — EPHEDRINE 5 MG/ML INJ
INTRAVENOUS | Status: AC
  Filled 2024-03-10: qty 5

## 2024-03-10 MED ORDER — ALBUTEROL SULFATE (2.5 MG/3ML) 0.083% IN NEBU: 2.5000 mg | INHALATION_SOLUTION | Freq: Four times a day (QID) | RESPIRATORY_TRACT | Status: DC | PRN

## 2024-03-10 MED ORDER — ONDANSETRON HCL 4 MG/2ML IJ SOLN
INTRAMUSCULAR | Status: AC
  Filled 2024-03-10: qty 2

## 2024-03-10 MED ORDER — CEFAZOLIN SODIUM-DEXTROSE 2-4 GM/100ML-% IV SOLN
2.0000 g | Freq: Four times a day (QID) | INTRAVENOUS | Status: AC
  Administered 2024-03-10 (×2): 2 g via INTRAVENOUS
  Filled 2024-03-10 (×2): qty 100

## 2024-03-10 MED ORDER — PHENYLEPHRINE HCL-NACL 20-0.9 MG/250ML-% IV SOLN
INTRAVENOUS | Status: DC | PRN
  Administered 2024-03-10: 50 ug/min via INTRAVENOUS

## 2024-03-10 MED ORDER — LEVOTHYROXINE SODIUM 25 MCG PO TABS
50.0000 ug | ORAL_TABLET | Freq: Every day | ORAL | Status: DC
Start: 2024-03-11 — End: 2024-03-11

## 2024-03-10 MED ORDER — DEXAMETHASONE SODIUM PHOSPHATE 10 MG/ML IJ SOLN
8.0000 mg | Freq: Once | INTRAMUSCULAR | Status: AC
  Administered 2024-03-10: 8 mg via INTRAVENOUS

## 2024-03-10 MED ORDER — SUCCINYLCHOLINE CHLORIDE 200 MG/10ML IV SOSY
PREFILLED_SYRINGE | INTRAVENOUS | Status: DC | PRN
  Administered 2024-03-10: 120 mg via INTRAVENOUS

## 2024-03-10 MED ORDER — HYDROMORPHONE HCL 1 MG/ML IJ SOLN
INTRAMUSCULAR | Status: AC
Start: 2024-03-10 — End: 2024-03-11
  Filled 2024-03-10: qty 1

## 2024-03-10 MED ORDER — FENTANYL CITRATE (PF) 250 MCG/5ML IJ SOLN
INTRAMUSCULAR | Status: DC | PRN
  Administered 2024-03-10: 100 ug via INTRAVENOUS
  Administered 2024-03-10 (×2): 50 ug via INTRAVENOUS

## 2024-03-10 MED ORDER — PANTOPRAZOLE SODIUM 40 MG PO TBEC: 80.0000 mg | DELAYED_RELEASE_TABLET | Freq: Every day | ORAL | Status: DC

## 2024-03-10 MED ORDER — OXYCODONE HCL 5 MG PO TABS
5.0000 mg | ORAL_TABLET | ORAL | Status: DC | PRN
Start: 2024-03-10 — End: 2024-03-11
  Administered 2024-03-10: 10 mg via ORAL
  Administered 2024-03-10: 5 mg via ORAL
  Administered 2024-03-11: 10 mg via ORAL
  Filled 2024-03-10: qty 2
  Filled 2024-03-10: qty 1
  Filled 2024-03-10 (×2): qty 2

## 2024-03-10 MED ORDER — SODIUM CHLORIDE 0.9 % IV SOLN: 12.5000 mg | INTRAVENOUS | Status: DC | PRN

## 2024-03-10 MED ORDER — OXYCODONE HCL 5 MG/5ML PO SOLN: 5.0000 mg | Freq: Once | ORAL | Status: DC | PRN

## 2024-03-10 MED ORDER — AMISULPRIDE (ANTIEMETIC) 5 MG/2ML IV SOLN: 10.0000 mg | Freq: Once | INTRAVENOUS | Status: DC | PRN

## 2024-03-10 MED ORDER — ONDANSETRON HCL 4 MG/2ML IJ SOLN: 4.0000 mg | Freq: Four times a day (QID) | INTRAMUSCULAR | Status: DC | PRN

## 2024-03-10 MED ORDER — POLYETHYLENE GLYCOL 3350 17 G PO PACK
17.0000 g | PACK | Freq: Every day | ORAL | Status: DC
  Administered 2024-03-10: 17 g via ORAL
  Filled 2024-03-10: qty 1

## 2024-03-10 MED ORDER — CHLORHEXIDINE GLUCONATE 0.12 % MT SOLN
15.0000 mL | Freq: Once | OROMUCOSAL | Status: AC
  Administered 2024-03-10: 15 mL via OROMUCOSAL
  Filled 2024-03-10: qty 15

## 2024-03-10 SURGICAL SUPPLY — 63 items
ALCOHOL 70% 16 OZ (MISCELLANEOUS) ×1 IMPLANT
ALLOGRAFT LORDOTIC CC 7X11X14 (Bone Implant) IMPLANT
ALLOGRAFT TRIAD LORDOTIC CC (Bone Implant) IMPLANT
BAG COUNTER SPONGE SURGICOUNT (BAG) ×1 IMPLANT
BAND RUBBER #18 3X1/16 STRL (MISCELLANEOUS) ×2 IMPLANT
BENZOIN TINCTURE PRP APPL 2/3 (GAUZE/BANDAGES/DRESSINGS) IMPLANT
BUR MATCHSTICK NEURO 3.0 LAGG (BURR) ×1 IMPLANT
CABLE BIPOLOR RESECTION CORD (MISCELLANEOUS) ×1 IMPLANT
CANISTER SUCT 3000ML PPV (MISCELLANEOUS) ×1 IMPLANT
CLSR STERI-STRIP ANTIMIC 1/2X4 (GAUZE/BANDAGES/DRESSINGS) ×1 IMPLANT
COVER MAYO STAND STRL (DRAPES) ×2 IMPLANT
COVER SURGICAL LIGHT HANDLE (MISCELLANEOUS) ×2 IMPLANT
DRAIN CHANNEL 15F RND FF W/TCR (WOUND CARE) IMPLANT
DRAPE C-ARM 42X72 X-RAY (DRAPES) ×1 IMPLANT
DRAPE LAPAROTOMY 100X72X124 (DRAPES) ×1 IMPLANT
DRAPE MICROSCOPE LEICA (MISCELLANEOUS) ×1 IMPLANT
DRAPE MICROSCOPE NONGLARE (MISCELLANEOUS) ×1 IMPLANT
DRAPE POUCH INSTRU U-SHP 10X18 (DRAPES) ×1 IMPLANT
DRAPE SURG 17X11 SM STRL (DRAPES) ×4 IMPLANT
DRAPE SURG 17X23 STRL (DRAPES) ×1 IMPLANT
DRAPE U-SHAPE 47X51 STRL (DRAPES) ×1 IMPLANT
DRAPE UTILITY XL STRL (DRAPES) ×1 IMPLANT
DRSG OPSITE POSTOP 3X4 (GAUZE/BANDAGES/DRESSINGS) ×1 IMPLANT
DURAPREP 26ML APPLICATOR (WOUND CARE) ×1 IMPLANT
ELECT COATED BLADE 2.86 ST (ELECTRODE) ×1 IMPLANT
ELECT NVM5 SURFACE MEP/EMG (ELECTRODE) IMPLANT
ELECTRODE BLADE INSULATED 4IN (ELECTROSURGICAL) ×1 IMPLANT
ELECTRODE REM PT RTRN 9FT ADLT (ELECTROSURGICAL) ×1 IMPLANT
GAUZE SPONGE 4X4 12PLY STRL (GAUZE/BANDAGES/DRESSINGS) IMPLANT
GLOVE BIO SURGEON STRL SZ7.5 (GLOVE) ×2 IMPLANT
GLOVE INDICATOR 7.5 STRL GRN (GLOVE) ×1 IMPLANT
GOWN STRL REUS W/ TWL LRG LVL3 (GOWN DISPOSABLE) ×1 IMPLANT
GOWN STRL SURGICAL XL XLNG (GOWN DISPOSABLE) ×1 IMPLANT
KIT BASIN OR (CUSTOM PROCEDURE TRAY) ×1 IMPLANT
KIT TURNOVER KIT B (KITS) ×1 IMPLANT
MODULE EMG NDL SSEP NVM5 (NEUROSURGERY SUPPLIES) IMPLANT
MODULE EMG NEEDLE SSEP NVM5 (NEUROSURGERY SUPPLIES) ×1 IMPLANT
NDL HYPO 22X1.5 SAFETY MO (MISCELLANEOUS) ×1 IMPLANT
NDL SPNL 18GX3.5 QUINCKE PK (NEEDLE) ×1 IMPLANT
NEEDLE HYPO 22X1.5 SAFETY MO (MISCELLANEOUS) ×1 IMPLANT
NEEDLE SPNL 18GX3.5 QUINCKE PK (NEEDLE) ×1 IMPLANT
NS IRRIG 1000ML POUR BTL (IV SOLUTION) ×1 IMPLANT
PACK ORTHO CERVICAL (CUSTOM PROCEDURE TRAY) ×1 IMPLANT
PACK UNIVERSAL I (CUSTOM PROCEDURE TRAY) ×1 IMPLANT
PAD ARMBOARD POSITIONER FOAM (MISCELLANEOUS) ×2 IMPLANT
PATTIES SURGICAL .5 X.5 (GAUZE/BANDAGES/DRESSINGS) IMPLANT
PENCIL BUTTON HOLSTER BLD 10FT (ELECTRODE) ×1 IMPLANT
PIN DISTRACTION MAXCESS-C 12 (PIN) IMPLANT
PLATE ACP 1.6X36 2LVL (Plate) IMPLANT
POSITIONER HEAD DONUT 9IN (MISCELLANEOUS) ×1 IMPLANT
SCREW ACP 3.5 X 13 S/D VARIA (Screw) ×2 IMPLANT
SCREW ACP 3.5X13 S/D VAR ANGLE (Screw) IMPLANT
SCREW ACP VA SD 3.5X15 (Screw) IMPLANT
SURGIFLO W/THROMBIN 8M KIT (HEMOSTASIS) IMPLANT
SUT BONE WAX W31G (SUTURE) ×1 IMPLANT
SUT MNCRL AB 3-0 PS2 27 (SUTURE) ×1 IMPLANT
SUT VIC AB 2-0 CT2 18 VCP726D (SUTURE) ×1 IMPLANT
SYR BULB IRRIG 60ML STRL (SYRINGE) ×1 IMPLANT
TOWEL GREEN STERILE (TOWEL DISPOSABLE) ×1 IMPLANT
TOWEL GREEN STERILE FF (TOWEL DISPOSABLE) ×1 IMPLANT
TRAY FOLEY W/BAG SLVR 16FR ST (SET/KITS/TRAYS/PACK) ×1 IMPLANT
TUBING FEATHERFLOW (TUBING) ×1 IMPLANT
WATER STERILE IRR 1000ML POUR (IV SOLUTION) ×1 IMPLANT

## 2024-03-10 NOTE — Op Note (Signed)
 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 male who presented to the office with progressive neurologic weakness in his left upper extremity and left arm radiculopathy. As result of his progressive neurologic deficit, operative management was discussed. The pre-operative MRI showed stenosis at multiple levels but given his pattern of radicular pain and weakness, symptoms were felt to be coming from C3/4 and C4/5 so a C3-5 anterior cervical discectomy and fusion was presented as a treatment option. The risks, including but not limited to pseudarthrosis, dysphagia, hematoma, airway compromise, recurrent laryngeal nerve injury, dvt/pe, esophageal perforation, durotomy, spinal cord injury, nerve root injury, persistent pain, adjacent segment disease, infection, bleeding, hardware failure, vascular injury, heart attack, death, stroke, fracture, and need for additional procedures were discussed with the patient. The benefit of surgery would be improvement in  his radiating arm pain and to prevent further neurologic decline with possibility of improvement with time. I explained to him that he may not get full relief of his pain with this surgery, especially any neck pain. The alternatives to surgical management would be continued monitoring, physical therapy, over-the-counter pain medications, injections, traction, and activity modification. All the patient's questions were answered to his satisfaction. After this discussion, the patient expressed understanding and elected to proceed with surgical intervention.  Procedure Description: The patient was met in the pre-operative holding area. The patient's identity and consent were verified. The operative site was marked. The patient's remaining questions about the surgery were answered. The patient was brought back to the operating room. General anesthesia was induced and an endotracheal tube was placed by the anesthesia staff. The patient was transferred to the flat top Armstrong table in the supine position. All bony prominences were well padded. A bump was placed underneath the patient's shoulders to extend the neck slightly.  Neuromonitoring leads were attached to the patient by the neuromonitoring technologist. Baseline neuromonitoring signals were obtained. The patient's shoulders were than taped to the bed to improve the fluoroscopic visualization during the procedure. The surgical area was cleansed with alcohol. Fluoroscopy was then brought in to check rotation on the AP image and to mark the levels on the lateral image. The patient's skin was then prepped and draped in a standard, sterile fashion. A time out was performed that identified the patient, the procedure, and the operative levels. All team members agreed with what was stated in the time out.   A left-sided transverse incision was made in the middle of the previously marked levels. Incision was taken sharply down through the skin and dermis.  Electrocautery was used to dissect down to the level of the platysma. A Metzenbaum scissors was used to elevate flaps both cranially and caudally above the platysma. A  small opening was made in the platysma with the Metzenbaum scissors. The scissors were then placed under the platysma and electrocautery was used on top of the scissors to split the platysma. Blunt dissection was carried out medial to the sternocleidomastoid to identify the omohyoid. The omohyoid was then dissected over its lateral aspect with the Metzenbaum scissors. An appendiceal retractor was placed around the carotid sheath to retract it laterally and a cloward retractor was placed medially to retractor the esophagus and trachea medially. The interval between these structures was then bluntly dissected with the Metzenbaum scissors. At this point, the prevertebral fascia was visible within the wound. A kittner was used to dissect the prevertebral fascia off the vertebral bodies and discs. A metallic sucker tip was placed over the disc thought to be the correct level. A lateral fluoroscopic shot was then used to confirm the level. Once the correct level was identified, electrocautery was used to mark the disc space. The retractors that were previously placed were then put back into the wound. The operative microscope was brought in to improve lighting and visualization.   Electrocautery was used to elevate the longus coli off the bone on each side. Shadowline retractor blades were then placed under the longus coli on each side and the self-retainer was attached to these retractor blades. A long handle fifteen blade was then used to incise the disc space along the endplates and longitudinally to connect these end plate incisions to create a rectangular incision in the disc. A pituitary was used to remove the incised disc material. A 2 kerrison was used to remove the anterior osteophyte and square the inferior endplate of the cranial vertebra. A  combination of straight and curved curettes were used to remove further disc material and the cartilaginous aspect of the endplates. This was done until both the inferior and superior endplates had been prepared from the uncus to uncus and from ventral vertebral body to the posterior osteophytes. At this point, a burr was used to remove the posterior vertebral osteophytes. Once the PLL was visualized within the wound, a nerve hook was used to develop the plane between the PLL and the dura. While lifting up the PLL with the nerve hook, a 1 kerrison was placed under the PLL and used to remove the PLL. Once some of the PLL had been removed, a combination of 1 and 2 kerrisons were used to continue removing the PLL until it had been completely removed. A curved curette was then placed into the foramen and pointed ventrally. It was used to remove soft tissue and the deep aspect of the uncus. A nerve hook was then easily passed into the foramen. The same process was repeated for the other foramen. Lateral fluoroscopic imaging was used to guide the insertion of trials into the disc space in a serial fashion, starting with the smallest trial. Once a good fit was obtained, the trial was left in and neuromonitoring signals were checked. There were no changes from baseline. AP and lateral fluoroscopic images were obtained and confirmed satisfactory position of the trial. A 6mm was selected for use as the final implant.  A 6mm structural allograft interbody device was then placed into the prepared C3/4 disc space and lightly malleted into place. Neuromonitoring signals were checked again and no changes from baseline were noted. AP and lateral fluoroscopic images was obtained and confirmed satisfactory position of the final implant.  The retractor blades were removed from the wound. Then, the same steps in  the above paragraph were repeated for the C4/5 disc space except that a better fit was obtained with a  7mm trial so this  was selected as the final structural allograft interbody spacer at this level. AP and lateral fluoroscopic images of the entire C spine were obtained which showed satisfactory positioning of all the interbody implants.   The microscope was then moved away from the operative field and the retractor blades were removed from the wound. A cloward was then used to retract the esophagus and trachea away from the ventral vertebral bodies and another was used to retract the carotid and sternocleidomastoid laterally. A plate was then selected and placed over the ventral aspects of the vertebral bodies. No soft tissue structures were underneath the plate. An awl was put into the wound and put into one of the screw holes in the plate. It was aimed slightly medially. It was used to create a pilot hole for the screw. A 13mm screw was then inserted into the drilled hole. The same steps were repeated with the drill and screw to place the same sized screw into the other hole in the plate at C4. At, C5 and C6, the same steps were repeated except 15mm screws were placed at these levels. 2 screws were placed at both C5 and C6. The wound was checked and no soft tissue structures were seen underneath the plate.   Final AP and lateral fluoroscopic films were taken and confirmed satisfactory position of the plate and interbody implants. The screws were final tightened. Hemostasis was achieved. A penrose drain was placed into the wound. The platysma was reapproximated using 2-0 vicryl. Neuromonitoring was checked and there were no changes from baseline. Neuromonitoring was discontinued at this time. The deep dermal layer was closed with 2-0 vicryl. The skin was closed with a 3-0 monocryl. All counts were correct at the end of the procedure. Benzoine and steri strips were used over the incision area. The wound was dressed with 4x4 gauze and paper tape. The patient was awakened from anesthesia and transferred to a bed. The patient was  brought back to the post-anesthesia care unit in stable condition by the anesthesia staff.   Post-operative plan: The patient will recover in the post-anesthesia care unit and then go to the floor. The patient will receive two post-operative doses of ancef . The patient will be out of bed as tolerated. He does not need to wear a cervical collar. He should not do any bending/lifting/twisting greater than 10 pounds. The patient will work with physical therapy. The drain will be removed on post-operative day one. The patient will likely discharge to home tomorrow.    Colette Davies, MD Orthopedic Surgeon

## 2024-03-10 NOTE — Transfer of Care (Signed)
 Immediate Anesthesia Transfer of Care Note  Patient: Dustin [name]  Procedure(s) Performed: ANTERIOR CERVICAL DECOMPRESSION/DISCECTOMY FUSION 2 LEVELS  Patient Location: PACU  Anesthesia Type:General  Level of Consciousness: drowsy, patient cooperative, and responds to stimulation  Airway & Oxygen Therapy: Patient Spontanous Breathing and Patient connected to face mask oxygen  Post-op Assessment: Report given to RN, Post -op Vital signs reviewed and stable, and Patient moving all extremities X 4  Post vital signs: Reviewed and stable  Last Vitals:  Vitals Value Taken Time  BP 154/79 03/10/24 1354  Temp 37.4 C 03/10/24 1354  Pulse 106 03/10/24 1357  Resp 20 03/10/24 1357  SpO2 96 % 03/10/24 1357  Vitals shown include unfiled device data.  Last Pain:  Vitals:   03/10/24 1354  TempSrc:   PainSc: Asleep         Complications: No notable events documented.

## 2024-03-10 NOTE — H&P (Signed)
 Orthopedic Spine Surgery H&P Note  Assessment: Patient is a 73 y.o. male with cervical radiculopathy   Plan: -Out of bed as tolerated, activity as tolerated, no brace -Covered the risks of surgery one more time with the patient and patient elected to proceed with planned surgery -Written consent verified -Hold anticoagulation in anticipation of surgery -Ancef  and TXA on all to OR -NPO for procedure -Site marked -To OR when ready  Risks covered this morning included but were not limited to: pseudarthrosis, dysphagia, hematoma, airway compromise, recurrent laryngeal nerve injury, esophageal perforation, durotomy, spinal cord injury, nerve root injury, persistent pain, adjacent segment disease, infection, bleeding, hardware failure, vascular injury, heart attack, death, stroke, dvt/pe, and need for additional procedures.   ___________________________________________________________________________  Chief Complaint: left arm weakness  History: Patient is 73 y.o. male who has been previously seen in the office for left arm weakness. At first it was painless weakness that he could tolerate. However, his weakness then became progressive and he developed radicular pain so operative management was discussed at our last visit.  The patient presents today with no changes in his symptoms since the last office visit. See previous office note for further details.    Review of systems: General: denies fevers and chills, myalgias Neurologic: denies recent changes in vision, slurred speech Abdomen: denies nausea, vomiting, hematemesis Respiratory: denies cough, shortness of breath  Past medical history: Hypertension Coronary disease Chronic kidney disease Heart failure GERD COPD Chronic pain   Allergies: Sulfa, budesonide-formoterol    Past surgical history:  Mitral valve repair L4/5 laminectomy and fusion Heart ablation Tonsillectomy   Social history: Denies use of nicotine product  (smoking, vaping, patches, smokeless) Alcohol use: Yes, 7 drinks per week Denies recreational drug use  Family history: -reviewed and not pertinent to cervical radiculopathy    Physical Exam:  General: no acute distress, appears stated age Neurologic: alert, answering questions appropriately, following commands Cardiovascular: regular rate, no cyanosis Respiratory: unlabored breathing on room air, symmetric chest rise Psychiatric: appropriate affect, normal cadence to speech   MSK (spine):  -Strength exam      Left  Right Grip strength                5/5  5/5 Interosseus   5/5   5/5 Wrist extension  5/5  5/5 Wrist flexion   5/5  5/5 Elbow extension  5/5  5/5 Elbow flexion   4-/5  5/5 Deltoid    1/5  5/5  EHL    4/5  4/5 TA    5/5  5/5 GSC    5/5  5/5 Knee extension  5/5  5/5 Knee flexion   5/5  5/5 Hip flexion   5/5  5/5  -Sensory exam    Sensation intact to light touch in L2-S1 nerve distributions of bilateral lower extremities  Sensation intact to light touch in C4-T1 nerve distributions of bilateral upper extremities   Patient name: Dustin [name] Patient MRN: [id number] Date: 03/10/24

## 2024-03-10 NOTE — Anesthesia Procedure Notes (Addendum)
 Procedure Name: Intubation Date/Time: 03/10/2024 8:16 AM  Performed by: Dawna Etienne, CRNAPre-anesthesia Checklist: Patient identified, Patient being monitored, Timeout performed, Emergency Drugs available and Suction available Patient Re-evaluated:Patient Re-evaluated prior to induction Oxygen Delivery Method: Circle System Utilized Preoxygenation: Pre-oxygenation with 100% oxygen Induction Type: IV induction Ventilation: Mask ventilation without difficulty Laryngoscope Size: Mac, 4 and Glidescope Grade View: Grade I Tube type: Oral Tube size: 7.5 mm Number of attempts: 1 Airway Equipment and Method: Rigid stylet and Video-laryngoscopy Placement Confirmation: ETT inserted through vocal cords under direct vision, positive ETCO2 and breath sounds checked- equal and bilateral Secured at: 22 cm Tube secured with: Tape Dental Injury: Teeth and Oropharynx as per pre-operative assessment  Difficulty Due To: Difficult Airway-  due to neck instability

## 2024-03-10 NOTE — Brief Op Note (Signed)
 03/10/2024  2:16 PM  PATIENT:  Dustin [name]  73 y.o. male  PRE-OPERATIVE DIAGNOSIS:  CERVICAL RADICULOPATHY  POST-OPERATIVE DIAGNOSIS:  CERVICAL RADICULOPATHY  PROCEDURE:  Procedure(s) with comments: ANTERIOR CERVICAL DECOMPRESSION/DISCECTOMY FUSION 2 LEVELS (N/A) - C3-5 ANTERIOR CERVICAL DISCECTOMY AND FUSION  SURGEON:  Surgeons and Role:    * Diedra Fowler, MD - Primary  PHYSICIAN ASSISTANT: none  ASSISTANTS: none   ANESTHESIA:   general  EBL:  25 mL   BLOOD ADMINISTERED:none  DRAINS: none   LOCAL MEDICATIONS USED:  NONE  SPECIMEN:  No Specimen  DISPOSITION OF SPECIMEN:  N/A  COUNTS:  YES  TOURNIQUET:  NONE  DICTATION: .Note written in EPIC  PLAN OF CARE: Admit for overnight observation  PATIENT DISPOSITION:  PACU - hemodynamically stable.   Delay start of Pharmacological VTE agent (>24hrs) due to surgical blood loss or risk of bleeding: yes

## 2024-03-10 NOTE — Anesthesia Postprocedure Evaluation (Signed)
 Anesthesia Post Note  Patient: Dustin [name]  Procedure(s) Performed: ANTERIOR CERVICAL DECOMPRESSION/DISCECTOMY FUSION 2 LEVELS     Patient location during evaluation: PACU Anesthesia Type: General Level of consciousness: awake and alert Pain management: pain level controlled Vital Signs Assessment: post-procedure vital signs reviewed and stable Respiratory status: spontaneous breathing, nonlabored ventilation and respiratory function stable Cardiovascular status: blood pressure returned to baseline and stable Postop Assessment: no apparent nausea or vomiting Anesthetic complications: no   No notable events documented.  Last Vitals:  Vitals:   03/10/24 1515 03/10/24 1530  BP: (!) 144/79 136/77  Pulse: (!) 103 93  Resp: 13 12  Temp:    SpO2: 94% 94%    Last Pain:  Vitals:   03/10/24 1354  TempSrc:   PainSc: Asleep   Pain Goal:                   Earvin Goldberg

## 2024-03-10 NOTE — Discharge Instructions (Signed)
 Orthopedic Surgery Discharge Instructions  Patient name: Dustin [name] Procedure Performed: C3-5 anterior cervical discectomy and fusion Date of Surgery: 03/10/2024 Surgeon: Colette Davies, MD  Pre-operative Diagnosis: cervical radiculopathy, left arm weakness Post-operative Diagnosis: same as above  Discharged to: home Discharge Condition: stable  Activity: You should refrain from bending, lifting, or twisting with objects greater than ten pounds until three months after surgery. You are encouraged to walk as much as desired. You can perform household activities such as cleaning dishes, doing laundry, vacuuming, etc. as long as the ten-pound restriction is followed. You do not need to wear a cervical collar after this surgery.   Incision Care: Your incision site has a dressing over it. That dressing should remain in place and dry at all times for a total of one week after surgery. After one week, you can remove the dressing. Underneath the dressing, you will find pieces of tape. You should leave these pieces of tape in place. They will fall off with time. Do not pick, rub, or scrub at them. Do not put cream or lotion over the surgical area. After one week and once the dressing is off, it is okay to let soap and water run over your incision. Again, do not pick, scrub, or rub at the pieces of tape when bathing. Do not submerge (e.g., take a bath, swim, go in a hot tub, etc.) until six weeks after surgery. There may be some bloody drainage from the incision into the dressing after surgery. This is normal. You do not need to replace the dressing. Continue to leave it in place for the one week as instructed above. Should the dressing become saturated with blood or drainage, please call the office for further instructions.   Medications: You have been prescribed oxycodone . This is a narcotic pain medication and should only be taken as prescribed. You should not drink alcohol or operate heavy machinery  (including driving) while taking this medication. The oxycodone  can cause constipation as a side effect. For that reason, you have been prescribed senna and miralax . These are both laxatives. You do not need to take this medication if you develop diarrhea. Should you remain constipated even while taking these medications, please increase the dose of miralax  to twice daily. Tylenol  has been prescribed to be taken every 8 hours, which will give you additional pain relief. Robaxin  is a muscle relaxer that has been prescribed to you for muscle spasm type pain. Take this medication as needed.   Do not take NSAIDs (ibuprofen, Aleve, Celebrex, naproxen, meloxicam, etc.) for the first 6 weeks after surgery as there is some evidence that their use may decrease the chances of successful fusion.   In order to set expectations for opioid prescriptions, you will only be prescribed opioids for a total of six weeks after surgery and, at two-weeks after surgery, your opioid prescription will start to tapered (decreased dosage and number of pills). If you have ongoing need for opioid medication six weeks after surgery, you will be referred to pain management. If you are already established with a provider that is giving you opioid medications, you should schedule an appointment with them for six weeks after surgery if you feel you are going to need another prescription. State law only allows for opioid prescriptions one week at a time. If you are running out of opioid medication near the end of the week, please call the office during business hours before running out so I can send you another prescription.  You may resume any home blood thinners (aspirin, warfarin, lovenox, apixaban, plavix, xarelto, etc.) 72 hours after your surgery. Take these medications as they were previously prescribed.   Driving: You should not drive while taking narcotic pain medications. You should also not drive if you feel you cannot turn your  body enough to check your blind spots. In this case, you should wait until further healing occurs and you feel that you can safely check your blind sports. You should start getting back to driving slowly and you may want to try driving in a parking lot before doing anything more.  Diet: You are safe to resume your regular diet. A common complication after cervical spine surgery is trouble swallowing especially if the incision was made over the front part of your neck. This complication happens often and, the vast majority of the time, it is a temporary problem that gets better with time. The first few days after surgery, you should take more bites than normal to break the food into smaller pieces before swallowing. With time as the swallowing becomes easier, you can gradually get back to taking bites like you normally would.   Reasons to Call the Office After Surgery: You should feel free to call the office with any concerns or questions you have in the post-operative period, but you should definitely notify the office if you develop: -shortness of breath, chest pain, or trouble breathing -excessive bleeding, drainage, redness, or swelling around the surgical site -fevers, chills, or pain that is getting worse with each passing day -persistent nausea or vomiting -new weakness in any extremity, new or worsening numbness or tingling in any extremity -numbness in the groin, bowel or bladder incontinence -other concerns about your surgery  Follow Up Appointments: You should have an office appointment scheduled for approximately two weeks after surgery. If you do not remember when this appointment is or do not already have it scheduled, please call the office to schedule.   Office Information:  -Colette Davies, MD -Phone number: [phone] -Address: [address]       [place], [place] [address]

## 2024-03-11 ENCOUNTER — Observation Stay (HOSPITAL_COMMUNITY)

## 2024-03-11 ENCOUNTER — Encounter (HOSPITAL_COMMUNITY): Payer: Self-pay | Admitting: Orthopedic Surgery

## 2024-03-11 ENCOUNTER — Other Ambulatory Visit (HOSPITAL_COMMUNITY): Payer: Self-pay

## 2024-03-11 DIAGNOSIS — M4802 Spinal stenosis, cervical region: Secondary | ICD-10-CM | POA: Diagnosis not present

## 2024-03-11 LAB — CBC
HCT: 35.2 % — ABNORMAL LOW (ref 39.0–52.0)
Hemoglobin: 12.2 g/dL — ABNORMAL LOW (ref 13.0–17.0)
MCH: 29.8 pg (ref 26.0–34.0)
MCHC: 34.7 g/dL (ref 30.0–36.0)
MCV: 85.9 fL (ref 80.0–100.0)
Platelets: 169 10*3/uL (ref 150–400)
RBC: 4.1 MIL/uL — ABNORMAL LOW (ref 4.22–5.81)
RDW: 14.4 % (ref 11.5–15.5)
WBC: 10.6 10*3/uL — ABNORMAL HIGH (ref 4.0–10.5)
nRBC: 0 % (ref 0.0–0.2)

## 2024-03-11 LAB — BASIC METABOLIC PANEL WITH GFR
Anion gap: 11 (ref 5–15)
BUN: 21 mg/dL (ref 8–23)
CO2: 19 mmol/L — ABNORMAL LOW (ref 22–32)
Calcium: 9.2 mg/dL (ref 8.9–10.3)
Chloride: 96 mmol/L — ABNORMAL LOW (ref 98–111)
Creatinine, Ser: 1.63 mg/dL — ABNORMAL HIGH (ref 0.61–1.24)
GFR, Estimated: 44 mL/min — ABNORMAL LOW (ref >60)
Glucose, Bld: 153 mg/dL — ABNORMAL HIGH (ref 70–99)
Potassium: 4.8 mmol/L (ref 3.5–5.1)
Sodium: 126 mmol/L — ABNORMAL LOW (ref 135–145)

## 2024-03-11 MED ORDER — OXYCODONE HCL 5 MG PO TABS
5.0000 mg | ORAL_TABLET | ORAL | 0 refills | Status: AC | PRN
Start: 2024-03-11 — End: 2024-03-18
  Filled 2024-03-11: qty 30, 4d supply, fill #0

## 2024-03-11 MED ORDER — SODIUM CHLORIDE 1 G PO TABS
1.0000 g | ORAL_TABLET | Freq: Once | ORAL | Status: AC
  Administered 2024-03-11: 1 g via ORAL
  Filled 2024-03-11: qty 1

## 2024-03-11 MED ORDER — SENNA 8.6 MG PO TABS
1.0000 | ORAL_TABLET | Freq: Two times a day (BID) | ORAL | 0 refills | Status: AC
  Filled 2024-03-11: qty 28, 14d supply, fill #0

## 2024-03-11 MED ORDER — POLYETHYLENE GLYCOL 3350 17 GM/SCOOP PO POWD
17.0000 g | Freq: Every day | ORAL | 0 refills | Status: AC
  Filled 2024-03-11: qty 238, 14d supply, fill #0

## 2024-03-11 MED ORDER — METHOCARBAMOL 500 MG PO TABS
500.0000 mg | ORAL_TABLET | Freq: Four times a day (QID) | ORAL | 0 refills | Status: AC
  Filled 2024-03-11: qty 40, 10d supply, fill #0

## 2024-03-11 MED ORDER — ACETAMINOPHEN 500 MG PO TABS
1000.0000 mg | ORAL_TABLET | Freq: Three times a day (TID) | ORAL | 0 refills | Status: AC
  Filled 2024-03-11: qty 84, 14d supply, fill #0

## 2024-03-11 NOTE — Plan of Care (Signed)
 Pt doing well. Pt and wife given D/C instructions with verbal understanding. Rx's were sent to the pharmacy by MD. Pt's incision is clean and dry with no sign of infection. Pt's IV was removed prior to D/C. Pt's drain was removed by MD. Pt D/C'd home via wheelchair per MD order. Pt is stable @ D/C and has no other needs at this time. Barron Lien, RN

## 2024-03-11 NOTE — Evaluation (Signed)
 Occupational Therapy Evaluation and Discharge Patient Details Name: Dustin [name] MRN: [id number] DOB: [birth year] Today's Date: 03/11/2024   History of Present Illness   This 73 yo male s/p C3/4, C4/5 anterior cervical discectomy and fusion. PHMx:CKD, CAD, GERD, HTN, hypothyroidism, lumbar spinal stenosis     Clinical Impressions This 73 yo male admitted and underwent above presents to acute OT with PLOF of being Mod I to independent with basic ADLs and IADLs and now needing increased A due to surgery and precautions. He has been educated on all the things he needs to do to protect his neck while it is healing and has been given the post op cervical handout. No further OT needs and no PT needs identified, we will sign off.     If plan is discharge home, recommend the following:   Assistance with cooking/housework;Assist for transportation;Help with stairs or ramp for entrance     Functional Status Assessment   Patient has had a recent decline in their functional status and demonstrates the ability to make significant improvements in function in a reasonable and predictable amount of time. (without further skilled OT needs)     Equipment Recommendations   None recommended by OT      Precautions/Restrictions   Precautions Precautions: Cervical Precaution Booklet Issued: Yes (comment) Recall of Precautions/Restrictions: Intact Required Braces or Orthoses:  (no brace needed per orders) Restrictions Weight Bearing Restrictions Per Provider Order: No     Mobility Bed Mobility Overal bed mobility: Modified Independent                  Transfers Overall transfer level: Needs assistance   Transfers: Sit to/from Stand Sit to Stand: Supervision           General transfer comment: CGA walking in hallway without AD and up and down 5 steps with one rail      Balance Overall balance assessment: Needs assistance Sitting-balance support: No upper extremity  supported, Feet supported Sitting balance-Leahy Scale: Good     Standing balance support: No upper extremity supported Standing balance-Leahy Scale: Fair Standing balance comment: VCs to sit down to put pants and underwear on                           ADL either performed or assessed with clinical judgement   ADL Overall ADL's : Needs assistance/impaired Eating/Feeding: Modified independent Eating/Feeding Details (indicate cue type and reason): Educated on use of washcloth between chin and collar to keep it clean, always use straws when drinking Grooming: Modified independent Grooming Details (indicate cue type and reason): educated on using 2 cups for mouth care (one for spitting and one for rinsing with straw) Upper Body Bathing: Set up;Sitting   Lower Body Bathing: Set up;Sit to/from stand Lower Body Bathing Details (indicate cue type and reason): Educated to bring legs up to him not bend over Upper Body Dressing : Set up;Supervision/safety Upper Body Dressing Details (indicate cue type and reason): VCS to not look down at buttons Lower Body Dressing: Minimal assistance Lower Body Dressing Details (indicate cue type and reason): with button, Educated to bring legs up to him not bend over, educated on using pants with elastic, S sit<>stand Toilet Transfer: Supervision/safety;Ambulation Toilet Transfer Details (indicate cue type and reason): simulated bed>out door>pratice steps>ambulate down hall and back to room Toileting- Clothing Manipulation and Hygiene: Minimal assistance Toileting - Clothing Manipulation Details (indicate cue type and reason): S sit<>stand  Vision Patient Visual Report: No change from baseline              Pertinent Vitals/Pain Pain Assessment Pain Assessment: 0-10 Pain Score: 2  Pain Location: incisional Pain Descriptors / Indicators: Sore Pain Intervention(s): Limited activity within patient's tolerance, Monitored during  session     Extremity/Trunk Assessment Upper Extremity Assessment Upper Extremity Assessment: Right hand dominant;LUE deficits/detail LUE Deficits / Details: Decreased strength in upper arm LUE Coordination: decreased gross motor              Cognition Arousal: Alert Behavior During Therapy: WFL for tasks assessed/performed Cognition: No apparent impairments             OT - Cognition Comments: Does have trouble recalling cervical precautions due to limited pain, multiple VC's                         Cueing  Cueing Techniques: Verbal cues              Home Living Family/patient expects to be discharged to:: Private residence Living Arrangements: Spouse/significant other Available Help at Discharge: Family;Available 24 hours/day Type of Home: House Home Access: Stairs to enter [employer] of Steps: 4 Entrance Stairs-Rails: Can reach both Home Layout: Multi-level;Able to live on main level with bedroom/bathroom     Bathroom Shower/Tub: Producer, television/film/video: Handicapped height     Home Equipment: Shower seat - built in;Hand held shower head          Prior Functioning/Environment Prior Level of Function : Independent/Modified Independent;Driving                    OT Problem List: Decreased strength;Impaired UE functional use;Impaired balance (sitting and/or standing)        OT Goals(Current goals can be found in the care plan section)   Acute Rehab OT Goals Patient Stated Goal: to go home today         AM-PAC OT "6 Clicks" Daily Activity     Outcome Measure Help from another person eating meals?: None Help from another person taking care of personal grooming?: A Little Help from another person toileting, which includes using toliet, bedpan, or urinal?: A Little Help from another person bathing (including washing, rinsing, drying)?: A Little Help from another person to put on and taking off regular upper  body clothing?: A Little Help from another person to put on and taking off regular lower body clothing?: A Little 6 Click Score: 19   End of Session Nurse Communication:  (pt ready to go from OT standpoint)  Activity Tolerance: Patient tolerated treatment well Patient left: in chair;with call bell/phone within reach;with family/visitor present  OT Visit Diagnosis: Unsteadiness on feet (R26.81);Pain Pain - part of body:  (incisional)                Time: 8295-6213 OT Time Calculation (min): 43 min Charges:  OT General Charges $OT Visit: 1 Visit OT Evaluation $OT Eval Moderate Complexity: 1 Mod OT Treatments $Self Care/Home Management : 23-37 mins Merryl Abraham OT Acute Rehabilitation Services Office [phone]    Lenox Raider 03/11/2024, 11:16 AM

## 2024-03-11 NOTE — Progress Notes (Signed)
 Orthopedic Surgery Post-operative Progress Note  Assessment: Patient is a 73 y.o. male who is currently admitted after undergoing C3-5 ACDF   Plan: -Operative plans complete -Needs upright films when able -Drain removed this morning and dressings replaced -Out of bed as tolerated, no collar -No bending/lifting/twisting greater than 10 pounds -OT evaluation and treat -Pain control -Regular diet -Ancef  x2 post-operative doses -No antiplatelet or dvt chemoprophylaxis for 72 hours after surgery -Anticipate discharge to home today  _________________________________________________________________________  Subjective: No acute events overnight. Patient states he tolerated dinner without issue. Reports eating roast beef. He also had breakfast this morning and did not experience any dysphagia. No radiating arm pain. Neck pain is well controlled.   Objective:  General: no acute distress, appropriate affect Neurologic: alert, answering questions appropriately, following commands Respiratory: unlabored breathing on room air Neck: no hematoma appreciated Skin: dressing with small amount of dry blood in it  MSK (spine):  -Strength exam      Right  Left Grip strength                5/5  5/5 Interosseus   5/5   5/5 Wrist extension  5/5  5/5 Wrist flexion   5/5  5/5 Elbow flexion   5/5  4-/5 Deltoid    5/5  1/5  EHL    4/5  4/5 TA    5/5  5/5 GSC    5/5  5/5 Knee extension  5/5  5/5 Hip flexion   5/5  5/5  -Sensory exam    Sensation intact to light touch in L2-S1 nerve distributions of bilateral lower extremities  Sensation intact to light touch in C4-T1 nerve distributions of bilateral upper extremities   Patient name: Dustin [name] Patient MRN: [id number] Date: 03/11/24

## 2024-03-11 NOTE — Discharge Summary (Addendum)
 Orthopedic Surgery Discharge Summary  Patient name: Dustin [name] Patient MRN: [id number] Admit today: 03/10/2024 Discharge date: 03/11/24  Attending physician: Colette Davies, MD Final diagnosis: cervical radiculopathy, left arm weakness Findings: degenerative discs at C3/4 and C4/5   Hospital course: Patient is a 73 y.o. male who was admitted after undergoing C3-5 anterior cervical discectomy and fusion. The patient had significant pain immediately after surgery, but pain eventually was controlled with a multimodal regimen including oxycodone . Labs during the hospitalization revealed hyponatremia with sodium of 126. Oral supplementation given. The patient worked with physical therapy who recommended discharge to home. The patient was tolerating an oral diet without issue and was voiding spontaneously after surgery. The patient's vitals were stable on the day of discharge. The patient's drains were removed on the day of discharge. The patient was medically ready for discharge and was discharge to home on post-operative day one.  Instructions:   Orthopedic Surgery Discharge Instructions  Patient name: Dustin [name] Procedure Performed: C3-5 anterior cervical discectomy and fusion Date of Surgery: 03/10/2024 Surgeon: Colette Davies, MD  Pre-operative Diagnosis: cervical radiculopathy, left arm weakness Post-operative Diagnosis: same as above  Discharged to: home Discharge Condition: stable  Activity: You should refrain from bending, lifting, or twisting with objects greater than ten pounds until three months after surgery. You are encouraged to walk as much as desired. You can perform household activities such as cleaning dishes, doing laundry, vacuuming, etc. as long as the ten-pound restriction is followed. You do not need to wear a cervical collar after this surgery.   Incision Care: Your incision site has a dressing over it. That dressing should remain in place and dry at all times for a  total of one week after surgery. After one week, you can remove the dressing. Underneath the dressing, you will find pieces of tape. You should leave these pieces of tape in place. They will fall off with time. Do not pick, rub, or scrub at them. Do not put cream or lotion over the surgical area. After one week and once the dressing is off, it is okay to let soap and water run over your incision. Again, do not pick, scrub, or rub at the pieces of tape when bathing. Do not submerge (e.g., take a bath, swim, go in a hot tub, etc.) until six weeks after surgery. There may be some bloody drainage from the incision into the dressing after surgery. This is normal. You do not need to replace the dressing. Continue to leave it in place for the one week as instructed above. Should the dressing become saturated with blood or drainage, please call the office for further instructions.   Medications: You have been prescribed oxycodone . This is a narcotic pain medication and should only be taken as prescribed. You should not drink alcohol or operate heavy machinery (including driving) while taking this medication. The oxycodone  can cause constipation as a side effect. For that reason, you have been prescribed senna and miralax . These are both laxatives. You do not need to take this medication if you develop diarrhea. Should you remain constipated even while taking these medications, please increase the dose of miralax  to twice daily. Tylenol  has been prescribed to be taken every 8 hours, which will give you additional pain relief. Robaxin  is a muscle relaxer that has been prescribed to you for muscle spasm type pain. Take this medication as needed.   Do not take NSAIDs (ibuprofen, Aleve, Celebrex, naproxen, meloxicam, etc.) for the first 6  weeks after surgery as there is some evidence that their use may decrease the chances of successful fusion.   In order to set expectations for opioid prescriptions, you will only be  prescribed opioids for a total of six weeks after surgery and, at two-weeks after surgery, your opioid prescription will start to tapered (decreased dosage and number of pills). If you have ongoing need for opioid medication six weeks after surgery, you will be referred to pain management. If you are already established with a provider that is giving you opioid medications, you should schedule an appointment with them for six weeks after surgery if you feel you are going to need another prescription. State law only allows for opioid prescriptions one week at a time. If you are running out of opioid medication near the end of the week, please call the office during business hours before running out so I can send you another prescription.   You may resume any home blood thinners (aspirin, warfarin, lovenox, apixaban, plavix, xarelto, etc.) 72 hours after your surgery. Take these medications as they were previously prescribed.   Driving: You should not drive while taking narcotic pain medications. You should also not drive if you feel you cannot turn your body enough to check your blind spots. In this case, you should wait until further healing occurs and you feel that you can safely check your blind sports. You should start getting back to driving slowly and you may want to try driving in a parking lot before doing anything more.  Diet: You are safe to resume your regular diet. A common complication after cervical spine surgery is trouble swallowing especially if the incision was made over the front part of your neck. This complication happens often and, the vast majority of the time, it is a temporary problem that gets better with time. The first few days after surgery, you should take more bites than normal to break the food into smaller pieces before swallowing. With time as the swallowing becomes easier, you can gradually get back to taking bites like you normally would.   Reasons to Call the Office After  Surgery: You should feel free to call the office with any concerns or questions you have in the post-operative period, but you should definitely notify the office if you develop: -shortness of breath, chest pain, or trouble breathing -excessive bleeding, drainage, redness, or swelling around the surgical site -fevers, chills, or pain that is getting worse with each passing day -persistent nausea or vomiting -new weakness in any extremity, new or worsening numbness or tingling in any extremity -numbness in the groin, bowel or bladder incontinence -other concerns about your surgery  Follow Up Appointments: You should have an office appointment scheduled for approximately two weeks after surgery. If you do not remember when this appointment is or do not already have it scheduled, please call the office to schedule.   Office Information:  -Colette Davies, MD -Phone number: [phone] -Address: [address]       [place], [place] [address]

## 2024-03-23 ENCOUNTER — Ambulatory Visit (INDEPENDENT_AMBULATORY_CARE_PROVIDER_SITE_OTHER): Admitting: Orthopedic Surgery

## 2024-03-23 ENCOUNTER — Other Ambulatory Visit (INDEPENDENT_AMBULATORY_CARE_PROVIDER_SITE_OTHER): Payer: Self-pay

## 2024-03-23 DIAGNOSIS — M5412 Radiculopathy, cervical region: Secondary | ICD-10-CM

## 2024-03-23 DIAGNOSIS — R29898 Other symptoms and signs involving the musculoskeletal system: Secondary | ICD-10-CM

## 2024-03-23 NOTE — Progress Notes (Signed)
 Orthopedic Surgery Post-operative Office Visit  Procedure: C3-5 ACDF Date of Surgery: 03/10/2024 (~2 weeks post-op)  Assessment: Patient is a 73 y.o. male who still has significant weakness but otherwise is doing well after surgery   Plan: -Operative plans complete -Okay to let soap/water run over incision, but do not submerge -Out of bed as tolerated, no collar needed -No bending/lifting/twisting greater than 10 pounds -Pain management: tylenol  as needed -Referred him to OT  -Return to office in 4 weeks, x-rays needed at next visit: AP/lateral cervical  ___________________________________________________________________________   Subjective: Patient has been at home since discharge from the hospital. He feels that his gait has improved and he has to think about the action of walking less. He is ambulating without any assistive devices. He is not having any radiating arm pain. Neck pain has improved significantly with time. He is not taking any medication for pain. He has noticed a sore throat and his wife has noticed a noise when he is sleeping. The noise is described as a gurgling. He does not have it during the day. His wife said he usually snores but this sounds different and it has only started since surgery. Has been eating regular meals without issue. Has not noticed any improvement in his arm strength.   Objective:  General: no acute distress, appropriate affect Neurologic: alert, answering questions appropriately, following commands Respiratory: unlabored breathing on room air Skin: incision is well approximated with no erythema, induration, active/expressible drainage  MSK (spine):  -Strength exam      Left  Right Grip strength                5/5  5/5 Interosseus   5/5   5/5 Wrist extension  5/5  5/5 Wrist flexion   5/5  5/5 Elbow flexion   4-/5  5/5 Deltoid    1/5  5/5  EHL    4/5  4/5 TA    5/5  5/5 GSC    5/5  5/5 Knee extension  5/5  5/5 Hip flexion    5/5  5/5  -Sensory exam    Sensation intact to light touch in L2-S1 nerve distributions of bilateral lower extremities  Sensation intact to light touch in C4-T1 nerve distributions of bilateral upper extremities   Imaging: X-rays of the cervical spine taken 03/23/2024 were independently reviewed and interpreted, showing anterior cervical instrumentation from C3-5. Interbody devices at C3/4 and C4/5 in appropriate position. No lucency seen around the screws. No fracture or dislocation seen. Disc height loss at C5/6 and C6/7.    Patient name: Dustin [name] Patient MRN: [id number] Date of visit: 03/23/24

## 2024-03-30 NOTE — Therapy (Signed)
 OUTPATIENT OCCUPATIONAL THERAPY ORTHO EVALUATION  Patient Name: Dustin [name] MRN: [id number] DOB:02/20/1951, 73 y.o., male Today's Date: 03/31/2024  PCP: Lonzell Robin, MD REFERRING PROVIDER: Diedra Fowler, MD   END OF SESSION:  OT End of Session - 03/31/24 1140     Visit Number 1    Number of Visits 10    Date for OT Re-Evaluation 05/15/24    Authorization Type [employer]    OT Start Time 1140    OT Stop Time 1238    OT Time Calculation (min) 58 min    Activity Tolerance Patient tolerated treatment well;No increased pain;Patient limited by fatigue;Patient limited by pain    Behavior During Therapy [facility] for tasks assessed/performed             Past Medical History:  Diagnosis Date   Aortic regurgitation    moderate to severe AR 01/22/24   Arthritis    CKD (chronic kidney disease)    Coronary artery disease    GERD (gastroesophageal reflux disease) 07/01/2018   Heart murmur    stage 4, 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   Hypertension    Hypothyroidism 07/01/2018   Paroxysmal A-fib (HCC)    S/P mitral valve repair 07/08/2017   Spinal stenosis of lumbar region 08/22/2021   Past Surgical History:  Procedure Laterality Date   ANTERIOR CERVICAL DECOMP/DISCECTOMY FUSION N/A 03/10/2024   Procedure: ANTERIOR CERVICAL DECOMPRESSION/DISCECTOMY FUSION 2 LEVELS;  Surgeon: Diedra Fowler, MD;  Location: [facility] OR;  Service: Orthopedics;  Laterality: N/A;  C3-5 ANTERIOR CERVICAL DISCECTOMY AND FUSION   CARDIAC CATHETERIZATION  05/14/2017   hx ablation for A-flutter  11/01/2017   MITRAL VALVE REPAIR (MV)/CORONARY ARTERY BYPASS GRAFTING (CABG)  07/08/2017   SVG-OM, LAA closure, MV reconstruction with annuloplasty and sliding plasty via superior septal approach/Medtronic Simplici-T Annuloplasty System, left GSV harvest 07/08/17 by Gwenlyn Lento, [facility] at [facility]   TONSILLECTOMY     removed as a child   Patient Active Problem List   Diagnosis Date Noted   Cervical radiculopathy  03/10/2024   S/P lumbar fusion 12/01/2021   Lumbar stenosis 11/29/2021   S/P mitral valve repair 07/01/2018   OSA (obstructive sleep apnea) 07/01/2018   GERD (gastroesophageal reflux disease) 07/01/2018   Hyperlipidemia 07/01/2018   Erectile dysfunction 07/01/2018   Hypothyroid 07/01/2018    ONSET DATE: DOS 03/10/24  REFERRING DIAG: A21.308 (ICD-10-CM) - Left arm weakness   THERAPY DIAG:  Muscle weakness (generalized)  Other lack of coordination  Stiffness of left shoulder, not elsewhere classified  Rationale for Evaluation and Treatment: Rehabilitation  SUBJECTIVE:   SUBJECTIVE STATEMENT: Now 3 weeks s/p ANTERIOR CERVICAL DECOMPRESSION/DISCECTOMY FUSION 2 LEVELS (N/A) - C3-5 ANTERIOR CERVICAL DISCECTOMY AND FUSION  He states his Lt arm has been getting weaker over 1.5 years and finally had need for cervical fusion to prevent nerve compression. He states mainly bicep and deltoid weakness. He states having good FMS, but still needs to help with right arm to do buttons due to bicep weakness.  He has started to see some improvements in function after surgery.    PERTINENT HISTORY: hx of other spinal fusion, past PT for LE weakness and decreased functional endurance in 2024.  Lt arm has been weak for approx 6 months or more   PRECAUTIONS: Cervical and Other: No BLT >10#  RED FLAGS: None   WEIGHT BEARING RESTRICTIONS: Yes 10#  PAIN:  Are you having pain? No, fatigue is main issue   FALLS: Has patient fallen  in last 6 months? No  PLOF: Independent  PATIENT GOALS: To improve use of left hand and arm for daily function   NEXT MD VISIT: 04/23/24   OBJECTIVE: (All objective assessments below are from initial evaluation on: 03/31/24 unless otherwise specified.)    HAND DOMINANCE: Ambidextrous  ADLs: Overall ADLs: States decreased ability to grab, hold household objects, pain and difficulty to open containers, perform FMS tasks (manipulate fasteners on clothing), mild to  moderate bathing problems as well.    FUNCTIONAL OUTCOME MEASURES: Eval: Patient Specific Functional Scale: 1.6 (washing hair, drinking out of a glass with left hand, picking up dog)  (Higher Score  =  Better Ability for the Selected Tasks)      UPPER EXTREMITY ROM     Shoulder to Wrist AROM Right eval Left eval  Shoulder flexion 154* 47 (with hiking)   Shoulder abduction 162* 72 (a lot of compensation) (140* PROM)  Shoulder extension 78 44  Shoulder internal rotation 52 44  Shoulder external rotation 77 75  Elbow flexion 155 144  Elbow extension (-5) (-5)  Forearm supination  76  Forearm pronation   85  Wrist flexion  36  Wrist extension  76  (Blank rows = not tested)   Hand AROM Left eval  Full Fist Ability (or Gap to Distal Palmar Crease) full  Thumb Opposition  (Kapandji Scale)  8/10  (Blank rows = not tested)   UPPER EXTREMITY MMT:     MMT Right 03/31/24 Left 03/31/24  Shoulder flexion 5/5 3-/5  Shoulder abduction 5/5 3-/5  Shoulder adduction    Shoulder extension  3-/5  Shoulder internal rotation  4+/5  Shoulder external rotation  4-/5  Middle trapezius  3-/5  Lower trapezius  3-/5  Elbow flexion 5/5 3/5  Elbow extension 5/5 4+/5  Forearm supination  3/5  Forearm pronation  5/5  Wrist flexion  4+/5  Wrist extension  5/5  Wrist ulnar deviation    Wrist radial deviation    (Blank rows = not tested)  HAND FUNCTION: Eval: Slight weakness in affected Lt hand.  Grip strength Right: 81 lbs, Left: 69 lbs   COORDINATION: Eval: Observed coordination impairments with affected Lt hand. Box and Blocks Test: Lt 42 Blocks today (45 is WFL, 65 is avg)  SENSATION: Eval:  Light touch intact today  EDEMA:   Eval: None  COGNITION: Eval: Overall cognitive status: WFL for evaluation today   OBSERVATIONS:   Eval: No significant pain, has a lot of compensatory shoulder hiking and scapular weakness with shoulder flexion and abduction.  A pattern of weakness in  shoulder flexion, abduction that travels through the bicep, supinator.   TODAY'S TREATMENT:  Post-evaluation treatment:   For safety/self-care he was reminded to do no painful head or neck motion, avoid lifting and carrying more than 10 pounds or aggressive bending lifting and twisting per doctors orders.  He was advised to do gentle cervical range of motion in pain-free planes of motion.  He was also educated on initial home exercise program including the exercises and activities as listed below.  These include scapular stabilization and neuromuscular reeducation activities as well as passive range of motion and assistive active range of motion exercises. He was told to try not to allow his shoulder to hike in a compensatory way, use of mirror if possible, and do supine exercises to help prevent shoulder hiking.  He states understanding, but these will need reviewed in upcoming sessions for for consistency and training.  Exercises - Seated Scapular Retraction  - 4-6 x daily - 5-10 reps - Supine Shoulder Protraction with Dowel  - 4-5 x daily - 1-2 sets - 10-15 reps - 3-5 sec hold - Supine Shoulder Flexion with Dowel  - 4-5 x daily - 3 reps - 15 sec hold - Supine Shoulder Press AAROM in Abduction with Dowel  - 4-5 x daily - 1-2 sets - 5-10 reps - Seated Single Arm Shoulder PNF D1 Flexion  - 4 x daily - 10 reps +also light head/neck AROM as tolerated 3-4 x day    PATIENT EDUCATION: Education details: See tx section above for details  Person educated: Patient Education method: Verbal Instruction, Teach back, Handouts  Education comprehension: States and demonstrates understanding, Additional Education required    HOME EXERCISE PROGRAM: Access Code: [email or web address] URL: [email or web address] Date: 03/31/2024 Prepared by: Leartis Proud   GOALS: Goals reviewed with patient? Yes   SHORT TERM GOALS: (STG required if POC>30 days) Target Date: 04/17/2024  Pt will demo/state  understanding of initial HEP to improve pain levels and prerequisite motion. Goal status: INITIAL   LONG TERM GOALS: Target Date: 05/15/2024  Pt will improve functional ability by decreased impairment per PSFS assessment from 1.6 to 5 or better, for better quality of life. Goal status: INITIAL  2.  Pt will improve A/ROM in left shoulder flexion from 47 degrees to at least 90 degrees, to have functional motion for tasks like reach and grasp.  Goal status: INITIAL  4.  Pt will improve strength in left bicep flexion from 3/5 MMT to at least 4/5 MMT to have increased functional ability to carry out selfcare and higher-level homecare tasks with less difficulty. Goal status: INITIAL  5.  Pt will improve coordination skills in left hand and arm, as seen by within functional limit score on box and blocks testing to have increased functional ability to carry out fine motor tasks (fasteners, etc.) and more complex, coordinated IADLs (meal prep, sports, etc.).  Goal status: INITIAL    ASSESSMENT:  CLINICAL IMPRESSION: Patient is a 73 y.o. male who was seen today for occupational therapy evaluation for weakness and stiffness to the right upper extremity which is chronic in nature, thought to be caused by nerve impingement for which she had a cervical decompression and fusion surgery about 3 weeks ago.  Weakness and stiffness is pervasive through the shoulder, bicep, supinator primarily and limits his functional ability.  He will benefit from outpatient occupational therapy to decrease symptoms, increase strength and functional ability.   PERFORMANCE DEFICITS: in functional skills including ADLs, IADLs, coordination, ROM, strength, Gross motor control, body mechanics, endurance, and UE functional use, cognitive skills including safety awareness, and psychosocial skills including coping strategies, environmental adaptation, habits, and routines and behaviors.   IMPAIRMENTS: are limiting patient from  ADLs, IADLs, and leisure.   COMORBIDITIES: has co-morbidities such as past lumbar fusions, poor balance and gait, history of heart disease, and other issues that affects occupational performance. Patient will benefit from skilled OT to address above impairments and improve overall function.  MODIFICATION OR ASSISTANCE TO COMPLETE EVALUATION: Min-Moderate modification of tasks or assist with assess necessary to complete an evaluation.  OT OCCUPATIONAL PROFILE AND HISTORY: Detailed assessment: Review of records and additional review of physical, cognitive, psychosocial history related to current functional performance.  CLINICAL DECISION MAKING: Moderate - several treatment options, min-mod task modification necessary  REHAB POTENTIAL: Good  EVALUATION COMPLEXITY: Moderate      PLAN:  OT FREQUENCY: 1-2x/week  OT DURATION: 6 weeks through 05/15/2024 and up to 10 total visits  PLANNED INTERVENTIONS: 97535 self care/ADL training, 16109 therapeutic exercise, 97530 therapeutic activity, 97112 neuromuscular re-education, 97140 manual therapy, 97032 electrical stimulation (manual), 97760 Orthotic Initial, 97763 Orthotic/Prosthetic subsequent, coping strategies training, patient/family education, and DME and/or AE instructions  RECOMMENDED OTHER SERVICES: None now  CONSULTED AND AGREED WITH PLAN OF CARE: Patient  PLAN FOR NEXT SESSION:   Review initial home exercise program and recommendations, add bicep active assistive range of motion and strengthening as well as supination work.  Consider adding nerve gliding as well.  Use caution for weight lifting precautions   Leartis Proud, OTR/L, CHT 03/31/2024, 6:35 PM     Referring diagnosis? R29.898 (ICD-10-CM) - Left arm weakness  Treatment diagnosis? (if different than referring diagnosis) M62.81, R27.8, M25.612 What was this (referring dx) caused by? [x]  Surgery []  Fall [x]  Ongoing issue []  Arthritis []  Other:  ____________  Laterality: [x]  Rt []  Lt []  Both  Check all possible CPT codes:  *CHOOSE 10 OR LESS*    97535 self care/ADL training, 60454 therapeutic exercise, 97530 therapeutic activity, 97112 neuromuscular re-education, 97140 manual therapy, [id number] electrical stimulation (manual), [id number] Orthotic Initial, [id number] Orthotic/Prosthetic subsequent, coping strategies training, patient/family education, and DME and/or AE instructions

## 2024-03-31 ENCOUNTER — Ambulatory Visit: Admitting: Rehabilitative and Restorative Service Providers"

## 2024-03-31 ENCOUNTER — Encounter: Payer: Self-pay | Admitting: Rehabilitative and Restorative Service Providers"

## 2024-03-31 DIAGNOSIS — M25612 Stiffness of left shoulder, not elsewhere classified: Secondary | ICD-10-CM | POA: Diagnosis not present

## 2024-03-31 DIAGNOSIS — R278 Other lack of coordination: Secondary | ICD-10-CM

## 2024-03-31 DIAGNOSIS — M6281 Muscle weakness (generalized): Secondary | ICD-10-CM

## 2024-04-06 NOTE — Therapy (Signed)
 OUTPATIENT OCCUPATIONAL THERAPY TREATMENT NOTE  Patient Name: Dustin [name] MRN: [id number] DOB:[birth year], 73 y.o., male Today's Date: 04/07/2024  PCP: Lonzell Robin, MD REFERRING PROVIDER: Diedra Fowler, MD   END OF SESSION:  OT End of Session - 04/07/24 1426     Visit Number 2    Number of Visits 10    Date for OT Re-Evaluation 05/15/24    Authorization Type [employer]    OT Start Time 1428    OT Stop Time 1512    OT Time Calculation (min) 44 min    Activity Tolerance Patient tolerated treatment well;No increased pain;Patient limited by fatigue;Patient limited by pain    Behavior During Therapy [facility] for tasks assessed/performed              Past Medical History:  Diagnosis Date   Aortic regurgitation    moderate to severe AR 01/22/24   Arthritis    CKD (chronic kidney disease)    Coronary artery disease    GERD (gastroesophageal reflux disease) 07/01/2018   Heart murmur    stage 4, 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   Hypertension    Hypothyroidism 07/01/2018   Paroxysmal A-fib (HCC)    S/P mitral valve repair 07/08/2017   Spinal stenosis of lumbar region 08/22/2021   Past Surgical History:  Procedure Laterality Date   ANTERIOR CERVICAL DECOMP/DISCECTOMY FUSION N/A 03/10/2024   Procedure: ANTERIOR CERVICAL DECOMPRESSION/DISCECTOMY FUSION 2 LEVELS;  Surgeon: Diedra Fowler, MD;  Location: [facility] OR;  Service: Orthopedics;  Laterality: N/A;  C3-5 ANTERIOR CERVICAL DISCECTOMY AND FUSION   CARDIAC CATHETERIZATION  05/14/2017   hx ablation for A-flutter  11/01/2017   MITRAL VALVE REPAIR (MV)/CORONARY ARTERY BYPASS GRAFTING (CABG)  07/08/2017   SVG-OM, LAA closure, MV reconstruction with annuloplasty and sliding plasty via superior septal approach/Medtronic Simplici-T Annuloplasty System, left GSV harvest 07/08/17 by Gwenlyn Lento, [facility] at [facility]   TONSILLECTOMY     removed as a child   Patient Active Problem List   Diagnosis Date Noted   Cervical radiculopathy  03/10/2024   S/P lumbar fusion 12/01/2021   Lumbar stenosis 11/29/2021   S/P mitral valve repair 07/01/2018   OSA (obstructive sleep apnea) 07/01/2018   GERD (gastroesophageal reflux disease) 07/01/2018   Hyperlipidemia 07/01/2018   Erectile dysfunction 07/01/2018   Hypothyroid 07/01/2018    ONSET DATE: DOS 03/10/24  REFERRING DIAG: W09.811 (ICD-10-CM) - Left arm weakness   THERAPY DIAG:  Muscle weakness (generalized)  Other lack of coordination  Stiffness of left shoulder, not elsewhere classified  Rationale for Evaluation and Treatment: Rehabilitation  PERTINENT HISTORY: hx of other spinal fusion, past PT for LE weakness and decreased functional endurance in 2024.  Lt arm has been weak for approx 6 months or more  He states his Lt arm has been getting weaker over 1.5 years and finally had need for cervical fusion to prevent nerve compression. He states mainly bicep and deltoid weakness. He states having good FMS, but still needs to help with right arm to do buttons due to bicep weakness.  He has started to see some improvements in function after surgery.  PRECAUTIONS: Cervical and Other: No BLT >10#  RED FLAGS: None   WEIGHT BEARING RESTRICTIONS: Yes 10#   SUBJECTIVE:   SUBJECTIVE STATEMENT: Now 4 weeks s/p ANTERIOR CERVICAL DECOMPRESSION/DISCECTOMY FUSION 2 LEVELS (N/A) - C3-5 ANTERIOR CERVICAL DISCECTOMY AND FUSION   He states not having any pain with his exercises and he felt a bit stronger at times  but other times he did feel his weakness and fatigue return.Aaron Aas      PAIN:  Are you having pain?  No, fatigue is main issue   FALLS: Has patient fallen in last 6 months? No  PLOF: Independent  PATIENT GOALS: To improve use of left hand and arm for daily function   NEXT MD VISIT: 04/23/24   OBJECTIVE: (All objective assessments below are from initial evaluation on: 03/31/24 unless otherwise specified.)    HAND DOMINANCE: Ambidextrous  ADLs: Overall ADLs: States  decreased ability to grab, hold household objects, pain and difficulty to open containers, perform FMS tasks (manipulate fasteners on clothing), mild to moderate bathing problems as well.    FUNCTIONAL OUTCOME MEASURES: Eval: Patient Specific Functional Scale: 1.6 (washing hair, drinking out of a glass with left hand, picking up dog)  (Higher Score  =  Better Ability for the Selected Tasks)      UPPER EXTREMITY ROM     Shoulder to Wrist AROM Right eval Left eval  Shoulder flexion 154* 47 (with hiking)   Shoulder abduction 162* 72 (a lot of compensation) (140* PROM)  Shoulder extension 78 44  Shoulder internal rotation 52 44  Shoulder external rotation 77 75  Elbow flexion 155 144  Elbow extension (-5) (-5)  Forearm supination  76  Forearm pronation   85  Wrist flexion  36  Wrist extension  76  (Blank rows = not tested)   Hand AROM Left eval  Full Fist Ability (or Gap to Distal Palmar Crease) full  Thumb Opposition  (Kapandji Scale)  8/10  (Blank rows = not tested)   UPPER EXTREMITY MMT:     MMT Right 03/31/24 Left 03/31/24  Shoulder flexion 5/5 3-/5  Shoulder abduction 5/5 3-/5  Shoulder adduction    Shoulder extension  3-/5  Shoulder internal rotation  4+/5  Shoulder external rotation  4-/5  Middle trapezius  3-/5  Lower trapezius  3-/5  Elbow flexion 5/5 3/5  Elbow extension 5/5 4+/5  Forearm supination  3/5  Forearm pronation  5/5  Wrist flexion  4+/5  Wrist extension  5/5  Wrist ulnar deviation    Wrist radial deviation    (Blank rows = not tested)  HAND FUNCTION: Eval: Slight weakness in affected Lt hand.  Grip strength Right: 81 lbs, Left: 69 lbs   COORDINATION: Eval: Observed coordination impairments with affected Lt hand. Box and Blocks Test: Lt 42 Blocks today (45 is WFL, 65 is avg)   OBSERVATIONS:   Eval: No significant pain, has a lot of compensatory shoulder hiking and scapular weakness with shoulder flexion and abduction.  A pattern of  weakness in shoulder flexion, abduction that travels through the bicep, supinator.   TODAY'S TREATMENT:  04/07/24: We review his initial home exercise program and he lies supine to perform shoulder stretches active assistive range of motion scapular work, Catering manager.  Next, OT educates on new active assistive range of motion with a dowel rod for bicep curls as well as bicep flexion with concurrent supination and isolated supination strengthening.  He has a lot of compensation and hiking, so he leans against the wall to support his back and he puts a ball underneath his elbow to support his shoulder and elbow to isolate these motions.  He does well with them but does fatigue rather quickly.  He has no pain with these today.    After reviewing performance of his full exercise program as listed below, he does functional push and  pull with the upper body or garment or on 2.7 resistance for 5 minutes at at least 45 RPM.  Lastly, he stands, looking into a mere to perform a functional push with active assistive range of motion with both hands on the ball trying to reach his arms overhead and OT getting cues to limit shoulder hiking.  He has difficulty with this and fatigues after about 7 or 8 repetitions.   Exercises - Seated Scapular Retraction  - 4-6 x daily - 5-10 reps - Supine Shoulder Protraction with Dowel  - 4-5 x daily - 1-2 sets - 10-15 reps - 3-5 sec hold - Supine Shoulder Flexion with Dowel  - 4-5 x daily - 3 reps - 15 sec hold - Supine Shoulder Press AAROM in Abduction with Dowel  - 4-5 x daily - 1-2 sets - 5-10 reps - Seated Single Arm Shoulder PNF D1 Flexion  - 4 x daily - 10 reps - Bicep Curls with Bar  - 4-6 x daily - 1 sets - 10-15 reps - Standing Single Arm Bicep Curls Neutral with Dumbbell  - 4-6 x daily - 1 sets - 10-15 reps - Forearm Pronation and Supination with Hammer  - 4-6 x daily - 1-2 sets - 10-15 reps    PATIENT EDUCATION: Education details: See tx section above for details   Person educated: Patient Education method: Verbal Instruction, Teach back, Handouts  Education comprehension: States and demonstrates understanding, Additional Education required    HOME EXERCISE PROGRAM: Access Code: [email or web address] URL: [email or web address] Date: 03/31/2024 Prepared by: Leartis Proud   GOALS: Goals reviewed with patient? Yes   SHORT TERM GOALS: (STG required if POC>30 days) Target Date: 04/17/2024  Pt will demo/state understanding of initial HEP to improve pain levels and prerequisite motion. Goal status: INITIAL   LONG TERM GOALS: Target Date: 05/15/2024  Pt will improve functional ability by decreased impairment per PSFS assessment from 1.6 to 5 or better, for better quality of life. Goal status: INITIAL  2.  Pt will improve A/ROM in left shoulder flexion from 47 degrees to at least 90 degrees, to have functional motion for tasks like reach and grasp.  Goal status: INITIAL  4.  Pt will improve strength in left bicep flexion from 3/5 MMT to at least 4/5 MMT to have increased functional ability to carry out selfcare and higher-level homecare tasks with less difficulty. Goal status: INITIAL  5.  Pt will improve coordination skills in left hand and arm, as seen by within functional limit score on box and blocks testing to have increased functional ability to carry out fine motor tasks (fasteners, etc.) and more complex, coordinated IADLs (meal prep, sports, etc.).  Goal status: INITIAL    ASSESSMENT:  CLINICAL IMPRESSION: 04/07/24: He is doing his home exercises very well with very good form which is a great sign.  He seems motivated, carry on  Eval: Patient is a 73 y.o. male who was seen today for occupational therapy evaluation for weakness and stiffness to the right upper extremity which is chronic in nature, thought to be caused by nerve impingement for which she had a cervical decompression and fusion surgery about 3 weeks ago.  Weakness and  stiffness is pervasive through the shoulder, bicep, supinator primarily and limits his functional ability.  He will benefit from outpatient occupational therapy to decrease symptoms, increase strength and functional ability.    PLAN:  OT FREQUENCY: 1-2x/week  OT DURATION: 6 weeks through 05/15/2024 and up to 10 total visits  PLANNED INTERVENTIONS: 97535 self care/ADL training, 16109 therapeutic exercise, 97530 therapeutic activity, 97112 neuromuscular re-education, 97140 manual therapy, [id number] electrical stimulation (manual), [id number] Orthotic Initial, [id number] Orthotic/Prosthetic subsequent, coping strategies training, patient/family education, and DME and/or AE instructions  RECOMMENDED OTHER SERVICES: None now  CONSULTED AND AGREED WITH PLAN OF CARE: Patient  PLAN FOR NEXT SESSION:   Add in scapular and shoulder extension/rows exercise also added shoulder external rotation strengthening as tolerated.  Get a check of active range of motion   Nastassia Bazaldua, OTR/L, CHT 04/07/2024, 3:15 PM

## 2024-04-07 ENCOUNTER — Ambulatory Visit: Admitting: Rehabilitative and Restorative Service Providers"

## 2024-04-07 ENCOUNTER — Encounter: Payer: Self-pay | Admitting: Rehabilitative and Restorative Service Providers"

## 2024-04-07 DIAGNOSIS — M25612 Stiffness of left shoulder, not elsewhere classified: Secondary | ICD-10-CM

## 2024-04-07 DIAGNOSIS — M6281 Muscle weakness (generalized): Secondary | ICD-10-CM | POA: Diagnosis not present

## 2024-04-07 DIAGNOSIS — R278 Other lack of coordination: Secondary | ICD-10-CM | POA: Diagnosis not present

## 2024-04-07 NOTE — Therapy (Signed)
 OUTPATIENT OCCUPATIONAL THERAPY TREATMENT NOTE  Patient Name: Dustin [name] MRN: [id number] DOB:31-Aug-1951, 73 y.o., male Today's Date: 04/09/2024  PCP: Lonzell Robin, MD REFERRING PROVIDER: Diedra Fowler, MD   END OF SESSION:  OT End of Session - 04/09/24 1259     Visit Number 3    Number of Visits 10    Date for OT Re-Evaluation 05/15/24    Authorization Type [employer]    OT Start Time 1300    OT Stop Time 1343    OT Time Calculation (min) 43 min    Activity Tolerance Patient tolerated treatment well;No increased pain;Patient limited by fatigue;Patient limited by pain    Behavior During Therapy [facility] for tasks assessed/performed               Past Medical History:  Diagnosis Date   Aortic regurgitation    moderate to severe AR 01/22/24   Arthritis    CKD (chronic kidney disease)    Coronary artery disease    GERD (gastroesophageal reflux disease) 07/01/2018   Heart murmur    stage 4, 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   Hypertension    Hypothyroidism 07/01/2018   Paroxysmal A-fib (HCC)    S/P mitral valve repair 07/08/2017   Spinal stenosis of lumbar region 08/22/2021   Past Surgical History:  Procedure Laterality Date   ANTERIOR CERVICAL DECOMP/DISCECTOMY FUSION N/A 03/10/2024   Procedure: ANTERIOR CERVICAL DECOMPRESSION/DISCECTOMY FUSION 2 LEVELS;  Surgeon: Diedra Fowler, MD;  Location: [facility] OR;  Service: Orthopedics;  Laterality: N/A;  C3-5 ANTERIOR CERVICAL DISCECTOMY AND FUSION   CARDIAC CATHETERIZATION  05/14/2017   hx ablation for A-flutter  11/01/2017   MITRAL VALVE REPAIR (MV)/CORONARY ARTERY BYPASS GRAFTING (CABG)  07/08/2017   SVG-OM, LAA closure, MV reconstruction with annuloplasty and sliding plasty via superior septal approach/Medtronic Simplici-T Annuloplasty System, left GSV harvest 07/08/17 by Gwenlyn Lento, [facility] at [facility]   TONSILLECTOMY     removed as a child   Patient Active Problem List   Diagnosis Date Noted   Cervical radiculopathy  03/10/2024   S/P lumbar fusion 12/01/2021   Lumbar stenosis 11/29/2021   S/P mitral valve repair 07/01/2018   OSA (obstructive sleep apnea) 07/01/2018   GERD (gastroesophageal reflux disease) 07/01/2018   Hyperlipidemia 07/01/2018   Erectile dysfunction 07/01/2018   Hypothyroid 07/01/2018    ONSET DATE: DOS 03/10/24  REFERRING DIAG: W09.811 (ICD-10-CM) - Left arm weakness   THERAPY DIAG:  Muscle weakness (generalized)  Other lack of coordination  Stiffness of left shoulder, not elsewhere classified  Rationale for Evaluation and Treatment: Rehabilitation  PERTINENT HISTORY: hx of other spinal fusion, past PT for LE weakness and decreased functional endurance in 2024.  Lt arm has been weak for approx 6 months or more  He states his Lt arm has been getting weaker over 1.5 years and finally had need for cervical fusion to prevent nerve compression. He states mainly bicep and deltoid weakness. He states having good FMS, but still needs to help with right arm to do buttons due to bicep weakness.  He has started to see some improvements in function after surgery.  PRECAUTIONS: Cervical and Other: No BLT >10#  RED FLAGS: None   WEIGHT BEARING RESTRICTIONS: Yes 10#   SUBJECTIVE:   SUBJECTIVE STATEMENT: Now 4+  weeks s/p ANTERIOR CERVICAL DECOMPRESSION/DISCECTOMY FUSION 2 LEVELS (N/A) - C3-5 ANTERIOR CERVICAL DISCECTOMY AND FUSION   He states not always doing prescribed exercises but sometimes doing other exercises like push-ups against  his nightstand.  OT states that these things are okay but he needs to focus on his areas of weakness.      PAIN:  Are you having pain?  No, fatigue is main issue   FALLS: Has patient fallen in last 6 months? No  PLOF: Independent  PATIENT GOALS: To improve use of left hand and arm for daily function   NEXT MD VISIT: 04/23/24   OBJECTIVE: (All objective assessments below are from initial evaluation on: 03/31/24 unless otherwise specified.)     HAND DOMINANCE: Ambidextrous  ADLs: Overall ADLs: States decreased ability to grab, hold household objects, pain and difficulty to open containers, perform FMS tasks (manipulate fasteners on clothing), mild to moderate bathing problems as well.    FUNCTIONAL OUTCOME MEASURES: Eval: Patient Specific Functional Scale: 1.6 (washing hair, drinking out of a glass with left hand, picking up dog)  (Higher Score  =  Better Ability for the Selected Tasks)      UPPER EXTREMITY ROM     Shoulder to Wrist AROM Right eval Left eval LT 04/09/24  Shoulder flexion 154* 47 (with hiking)  51  Shoulder abduction 162* 72 (a lot of compensation) (140* PROM) 58  Shoulder extension 78 44   Shoulder internal rotation 52 44   Shoulder external rotation 77 75   Elbow flexion 155 144 151  Elbow extension (-5) (-5) (-5)   Forearm supination  76 78  Forearm pronation   85   Wrist flexion  36 39  Wrist extension  76 84  (Blank rows = not tested)   Hand AROM Left eval  Full Fist Ability (or Gap to Distal Palmar Crease) full  Thumb Opposition  (Kapandji Scale)  8/10  (Blank rows = not tested)   UPPER EXTREMITY MMT:     MMT Right 03/31/24 Left 03/31/24  Shoulder flexion 5/5 3-/5  Shoulder abduction 5/5 3-/5  Shoulder adduction    Shoulder extension  3-/5  Shoulder internal rotation  4+/5  Shoulder external rotation  4-/5  Middle trapezius  3-/5  Lower trapezius  3-/5  Elbow flexion 5/5 3/5  Elbow extension 5/5 4+/5  Forearm supination  3/5  Forearm pronation  5/5  Wrist flexion  4+/5  Wrist extension  5/5  Wrist ulnar deviation    Wrist radial deviation    (Blank rows = not tested)  HAND FUNCTION: Eval: Slight weakness in affected Lt hand.  Grip strength Right: 81 lbs, Left: 69 lbs   COORDINATION: Eval: Observed coordination impairments with affected Lt hand. Box and Blocks Test: Lt 42 Blocks today (45 is WFL, 65 is avg)   OBSERVATIONS:   Eval: No significant pain, has a lot  of compensatory shoulder hiking and scapular weakness with shoulder flexion and abduction.  A pattern of weakness in shoulder flexion, abduction that travels through the bicep, supinator.   TODAY'S TREATMENT:  04/09/24: He starts with active range of motion for new measures which shows some improvements in wrist, shoulder, some areas without improvement.   Next, we review his home exercise program thus far with him performing scapular stabilization in supine with stretches and shoulder strengthening in AAROM.  Next, OT adds scapular and shoulder retraction exercises in the bolded ways below.  These are meant to help with his apparent postural problems with forward rounded shoulder blades, etc.  He tolerates these well, this is not painful to his neck, etc.   We also reviewed new or exercises and dynamic stability activities for bicep and  supination.  He tolerates all these well, leaves without pain.    Exercises - Seated Scapular Retraction  - 4-6 x daily - 5-10 reps - Supine Shoulder Protraction with Dowel  - 4-5 x daily - 1-2 sets - 10-15 reps - 3-5 sec hold - Supine Shoulder Flexion with Dowel  - 4-5 x daily - 3 reps - 15 sec hold - Supine Shoulder Press AAROM in Abduction with Dowel  - 4-5 x daily - 1-2 sets - 5-10 reps - Seated Single Arm Shoulder PNF D1 Flexion  - 4 x daily - 10 reps - Seated Bicep Curls with Bar  - 4-6 x daily - 1 sets - 10-15 reps - Standing Single Arm Bicep Curls Neutral with Dumbbell  - 4-6 x daily - 1 sets - 10-15 reps - Forearm Pronation and Supination with Hammer  - 4-6 x daily - 1-2 sets - 10-15 reps - Wrist Flexion with Dumbbell  - 4-6 x daily - 1 sets - 10-15 reps - Seated Shoulder Row with Anchored Resistance  - 4-6 x daily - 1 sets - 10-15 reps - Seated Shoulder W External Rotation on Swiss Ball  - 4-6 x daily - 1 sets - 10-15 reps    PATIENT EDUCATION: Education details: See tx section above for details  Person educated: Patient Education method: Verbal  Instruction, Teach back, Handouts  Education comprehension: States and demonstrates understanding, Additional Education required    HOME EXERCISE PROGRAM: Access Code: [email or web address] URL: [email or web address] Date: 03/31/2024 Prepared by: Leartis Proud   GOALS: Goals reviewed with patient? Yes   SHORT TERM GOALS: (STG required if POC>30 days) Target Date: 04/17/2024  Pt will demo/state understanding of initial HEP to improve pain levels and prerequisite motion. Goal status: INITIAL   LONG TERM GOALS: Target Date: 05/15/2024  Pt will improve functional ability by decreased impairment per PSFS assessment from 1.6 to 5 or better, for better quality of life. Goal status: INITIAL  2.  Pt will improve A/ROM in left shoulder flexion from 47 degrees to at least 90 degrees, to have functional motion for tasks like reach and grasp.  Goal status: INITIAL  4.  Pt will improve strength in left bicep flexion from 3/5 MMT to at least 4/5 MMT to have increased functional ability to carry out selfcare and higher-level homecare tasks with less difficulty. Goal status: INITIAL  5.  Pt will improve coordination skills in left hand and arm, as seen by within functional limit score on box and blocks testing to have increased functional ability to carry out fine motor tasks (fasteners, etc.) and more complex, coordinated IADLs (meal prep, sports, etc.).  Goal status: INITIAL    ASSESSMENT:  CLINICAL IMPRESSION: 04/09/24: Tolerates new exercises for scapular stability well, showing some slight improvements in range of motion and slight decreases in shoulder hiking.  Carry on  04/07/24: He is doing his home exercises very well with very good form which is a great sign.  He seems motivated, carry on  Eval: Patient is a 73 y.o. male who was seen today for occupational therapy evaluation for weakness and stiffness to the right upper extremity which is chronic in nature, thought to be caused  by nerve impingement for which she had a cervical decompression and fusion surgery about 3 weeks ago.  Weakness and stiffness is pervasive through the shoulder, bicep, supinator primarily and limits his functional ability.  He will benefit from outpatient occupational therapy to decrease symptoms, increase strength and functional ability.  PLAN:  OT FREQUENCY: 1-2x/week  OT DURATION: 6 weeks through 05/15/2024 and up to 10 total visits  PLANNED INTERVENTIONS: 97535 self care/ADL training, 16109 therapeutic exercise, 97530 therapeutic activity, 97112 neuromuscular re-education, 97140 manual therapy, 97032 electrical stimulation (manual), 97760 Orthotic Initial, 97763 Orthotic/Prosthetic subsequent, coping strategies training, patient/family education, and DME and/or AE instructions  RECOMMENDED OTHER SERVICES: None now  CONSULTED AND AGREED WITH PLAN OF CARE: Patient  PLAN FOR NEXT SESSION:   Continue to give new options for home exercises, given modifications to help form, continue with functional activities, etc.    Leartis Proud, OTR/L, CHT 04/09/2024, 2:45 PM

## 2024-04-09 ENCOUNTER — Encounter: Payer: Self-pay | Admitting: Rehabilitative and Restorative Service Providers"

## 2024-04-09 ENCOUNTER — Ambulatory Visit: Admitting: Rehabilitative and Restorative Service Providers"

## 2024-04-09 DIAGNOSIS — R278 Other lack of coordination: Secondary | ICD-10-CM

## 2024-04-09 DIAGNOSIS — M25612 Stiffness of left shoulder, not elsewhere classified: Secondary | ICD-10-CM

## 2024-04-09 DIAGNOSIS — M6281 Muscle weakness (generalized): Secondary | ICD-10-CM

## 2024-04-15 NOTE — Therapy (Signed)
 OUTPATIENT OCCUPATIONAL THERAPY TREATMENT NOTE  Patient Name: Dustin [name] MRN: [id number] DOB:02-16-1951, 73 y.o., male Today's Date: 04/16/2024  PCP: Lonzell Robin, MD REFERRING PROVIDER: Diedra Fowler, MD   END OF SESSION:  OT End of Session - 04/16/24 1441     Visit Number 4    Number of Visits 10    Date for OT Re-Evaluation 05/15/24    Authorization Type [employer]    OT Start Time 1440    OT Stop Time 1533    OT Time Calculation (min) 53 min    Activity Tolerance Patient tolerated treatment well;No increased pain;Patient limited by fatigue    Behavior During Therapy [facility] for tasks assessed/performed              Past Medical History:  Diagnosis Date   Aortic regurgitation    moderate to severe AR 01/22/24   Arthritis    CKD (chronic kidney disease)    Coronary artery disease    GERD (gastroesophageal reflux disease) 07/01/2018   Heart murmur    stage 4, 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   Hypertension    Hypothyroidism 07/01/2018   Paroxysmal A-fib (HCC)    S/P mitral valve repair 07/08/2017   Spinal stenosis of lumbar region 08/22/2021   Past Surgical History:  Procedure Laterality Date   ANTERIOR CERVICAL DECOMP/DISCECTOMY FUSION N/A 03/10/2024   Procedure: ANTERIOR CERVICAL DECOMPRESSION/DISCECTOMY FUSION 2 LEVELS;  Surgeon: Diedra Fowler, MD;  Location: [facility] OR;  Service: Orthopedics;  Laterality: N/A;  C3-5 ANTERIOR CERVICAL DISCECTOMY AND FUSION   CARDIAC CATHETERIZATION  05/14/2017   hx ablation for A-flutter  11/01/2017   MITRAL VALVE REPAIR (MV)/CORONARY ARTERY BYPASS GRAFTING (CABG)  07/08/2017   SVG-OM, LAA closure, MV reconstruction with annuloplasty and sliding plasty via superior septal approach/Medtronic Simplici-T Annuloplasty System, left GSV harvest 07/08/17 by Gwenlyn Lento, [facility] at [facility]   TONSILLECTOMY     removed as a child   Patient Active Problem List   Diagnosis Date Noted   Cervical radiculopathy 03/10/2024   S/P lumbar  fusion 12/01/2021   Lumbar stenosis 11/29/2021   S/P mitral valve repair 07/01/2018   OSA (obstructive sleep apnea) 07/01/2018   GERD (gastroesophageal reflux disease) 07/01/2018   Hyperlipidemia 07/01/2018   Erectile dysfunction 07/01/2018   Hypothyroid 07/01/2018    ONSET DATE: DOS 03/10/24  REFERRING DIAG: N82.956 (ICD-10-CM) - Left arm weakness   THERAPY DIAG:  Muscle weakness (generalized)  Other lack of coordination  Stiffness of left shoulder, not elsewhere classified  Rationale for Evaluation and Treatment: Rehabilitation  PERTINENT HISTORY: hx of other spinal fusion, past PT for LE weakness and decreased functional endurance in 2024.  Lt arm has been weak for approx 6 months or more  He states his Lt arm has been getting weaker over 1.5 years and finally had need for cervical fusion to prevent nerve compression. He states mainly bicep and deltoid weakness. He states having good FMS, but still needs to help with right arm to do buttons due to bicep weakness.  He has started to see some improvements in function after surgery.  PRECAUTIONS: Cervical and Other: No BLT >10#  RED FLAGS: None   WEIGHT BEARING RESTRICTIONS: Yes 10#   SUBJECTIVE:   SUBJECTIVE STATEMENT: Now 5+  weeks s/p ANTERIOR CERVICAL DECOMPRESSION/DISCECTOMY FUSION 2 LEVELS (N/A) - C3-5 ANTERIOR CERVICAL DISCECTOMY AND FUSION   He states not having any pain per normal, he has been doing exercises periodically and trying to fit them  into his day the best that he can.    PAIN:  Are you having pain?   No, fatigue is main issue   FALLS: Has patient fallen in last 6 months? No  PLOF: Independent  PATIENT GOALS: To improve use of left hand and arm for daily function   NEXT MD VISIT: 04/23/24   OBJECTIVE: (All objective assessments below are from initial evaluation on: 03/31/24 unless otherwise specified.)    HAND DOMINANCE: Ambidextrous  ADLs: Overall ADLs: States decreased ability to grab,  hold household objects, pain and difficulty to open containers, perform FMS tasks (manipulate fasteners on clothing), mild to moderate bathing problems as well.    FUNCTIONAL OUTCOME MEASURES: Eval: Patient Specific Functional Scale: 1.6 (washing hair, drinking out of a glass with left hand, picking up dog)  (Higher Score  =  Better Ability for the Selected Tasks)      UPPER EXTREMITY ROM     Shoulder to Wrist AROM Right eval Left eval LT 04/09/24 Lt 04/16/24  Shoulder flexion 154* 47 (with hiking)  51 54  Shoulder abduction 162* 72 (a lot of compensation) (140* PROM) 58 63  Shoulder extension 78 44  45  Shoulder internal rotation 52 44  48  Shoulder external rotation 77 75  80  Elbow flexion 155 144 151   Elbow extension (-5) (-5) (-5)    Forearm supination  76 78   Forearm pronation   85    Wrist flexion  36 39 54  Wrist extension  76 84 82  (Blank rows = not tested)   Hand AROM Left eval  Full Fist Ability (or Gap to Distal Palmar Crease) full  Thumb Opposition  (Kapandji Scale)  8/10  (Blank rows = not tested)   UPPER EXTREMITY MMT:     MMT Right 03/31/24 Left 03/31/24  Shoulder flexion 5/5 3-/5  Shoulder abduction 5/5 3-/5  Shoulder adduction    Shoulder extension  3-/5  Shoulder internal rotation  4+/5  Shoulder external rotation  4-/5  Middle trapezius  3-/5  Lower trapezius  3-/5  Elbow flexion 5/5 3/5  Elbow extension 5/5 4+/5  Forearm supination  3/5  Forearm pronation  5/5  Wrist flexion  4+/5  Wrist extension  5/5  Wrist ulnar deviation    Wrist radial deviation    (Blank rows = not tested)  HAND FUNCTION: Eval: Slight weakness in affected Lt hand.  Grip strength Right: 81 lbs, Left: 69 lbs   COORDINATION: Eval: Observed coordination impairments with affected Lt hand. Box and Blocks Test: Lt 42 Blocks today (45 is WFL, 65 is avg)   OBSERVATIONS:   Eval: No significant pain, has a lot of compensatory shoulder hiking and scapular weakness  with shoulder flexion and abduction.  A pattern of weakness in shoulder flexion, abduction that travels through the bicep, supinator.   TODAY'S TREATMENT:  04/16/24: He performs active range of motion at the start of the session for detailed new measures which shows good but slow improvements from the shoulder down to the hand and wrist.  His areas of weakness are improving, though he still has gross atrophy of the rotator cuff and hiking compensation at the shoulder.  We discussed continue safety with weightbearing and not trying to force head and neck motion, though he should be doing resistive training in the ways that the therapist is teaching him.  So far none of these things have been painful, and we review them and perform them today again  with modifications and using different types of equipment.  For example, instead of using therapy bands to perform shoulder rows, we use more typical "gym style" equipment that he would have access to in the community.  He tolerates the exercises and activities listed below for strengthening and scapular stabilization and neuromuscular reeducation.  He states understanding all exercises, leaves without any pain   Now doing scap protraction with 3# dowel x10-15 Sh flex/eccentrics now with 3# dowel  x10-15 Alternative sh ext and ER stretches Side-lying shoulder ER with cues for form x15 15# shoulder rows bil x15 Lat Rows 20# x15 Bicep curls 3# dowel PNF pattern D1 flex & ext x10    PATIENT EDUCATION: Education details: See tx section above for details  Person educated: Patient Education method: Engineer, structural, Teach back, Handouts  Education comprehension: States and demonstrates understanding, Additional Education required    HOME EXERCISE PROGRAM: Access Code: [email or web address] URL: [email or web address] Date: 03/31/2024 Prepared by: Leartis Proud   GOALS: Goals reviewed with patient? Yes   SHORT TERM GOALS: (STG required if  POC>30 days) Target Date: 04/17/2024  Pt will demo/state understanding of initial HEP to improve pain levels and prerequisite motion. Goal status: 04/16/2024: Met   LONG TERM GOALS: Target Date: 05/15/2024  Pt will improve functional ability by decreased impairment per PSFS assessment from 1.6 to 5 or better, for better quality of life. Goal status: INITIAL  2.  Pt will improve A/ROM in left shoulder flexion from 47 degrees to at least 90 degrees, to have functional motion for tasks like reach and grasp.  Goal status: INITIAL  4.  Pt will improve strength in left bicep flexion from 3/5 MMT to at least 4/5 MMT to have increased functional ability to carry out selfcare and higher-level homecare tasks with less difficulty. Goal status: INITIAL  5.  Pt will improve coordination skills in left hand and arm, as seen by within functional limit score on box and blocks testing to have increased functional ability to carry out fine motor tasks (fasteners, etc.) and more complex, coordinated IADLs (meal prep, sports, etc.).  Goal status: INITIAL    ASSESSMENT:  CLINICAL IMPRESSION: 04/16/24: It is excellent to see that he is making progress in terms of range of motion at the shoulder, elbow, wrist and hand.  He is also not having any pain, and function seems to be improving somewhat.  This is a long process and he is aware of that.    PLAN:  OT FREQUENCY: 1-2x/week  OT DURATION: 6 weeks through 05/15/2024 and up to 10 total visits  PLANNED INTERVENTIONS: 97535 self care/ADL training, 78469 therapeutic exercise, 97530 therapeutic activity, 97112 neuromuscular re-education, 97140 manual therapy, 97032 electrical stimulation (manual), 97760 Orthotic Initial, 97763 Orthotic/Prosthetic subsequent, coping strategies training, patient/family education, and DME and/or AE instructions  RECOMMENDED OTHER SERVICES: None now  CONSULTED AND AGREED WITH PLAN OF CARE: Patient  PLAN FOR NEXT SESSION:    Continue on, address any specific functional concerns, continue to modify exercises and activities to get the best muscle fiber recruitment, continue to emphasize repetition for nerve recovery and recovery of atrophied muscle groups.   Leartis Proud, OTR/L, CHT 04/16/2024, 4:56 PM

## 2024-04-16 ENCOUNTER — Encounter: Payer: Self-pay | Admitting: Rehabilitative and Restorative Service Providers"

## 2024-04-16 ENCOUNTER — Ambulatory Visit: Admitting: Rehabilitative and Restorative Service Providers"

## 2024-04-16 DIAGNOSIS — M25612 Stiffness of left shoulder, not elsewhere classified: Secondary | ICD-10-CM | POA: Diagnosis not present

## 2024-04-16 DIAGNOSIS — R278 Other lack of coordination: Secondary | ICD-10-CM | POA: Diagnosis not present

## 2024-04-16 DIAGNOSIS — M6281 Muscle weakness (generalized): Secondary | ICD-10-CM | POA: Diagnosis not present

## 2024-04-20 NOTE — Therapy (Signed)
 OUTPATIENT OCCUPATIONAL THERAPY TREATMENT NOTE  Patient Name: Dustin [name] MRN: [id number] DOB:[birth year], 73 y.o., male Today's Date: 04/21/2024  PCP: Lonzell Robin, MD REFERRING PROVIDER: Diedra Fowler, MD   END OF SESSION:  OT End of Session - 04/21/24 1254     Visit Number 5    Number of Visits 10    Date for OT Re-Evaluation 05/15/24    Authorization Type [employer]    OT Start Time 1300    OT Stop Time 1347    OT Time Calculation (min) 47 min    Activity Tolerance Patient tolerated treatment well;No increased pain;Patient limited by fatigue;Patient limited by pain    Behavior During Therapy 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   Hypertension    Hypothyroidism 07/01/2018   Paroxysmal A-fib (HCC)    S/P mitral valve repair 07/08/2017   Spinal stenosis of lumbar region 08/22/2021   Past Surgical History:  Procedure Laterality Date   ANTERIOR CERVICAL DECOMP/DISCECTOMY FUSION N/A 03/10/2024   Procedure: ANTERIOR CERVICAL DECOMPRESSION/DISCECTOMY FUSION 2 LEVELS;  Surgeon: Diedra Fowler, MD;  Location: [facility] OR;  Service: Orthopedics;  Laterality: N/A;  C3-5 ANTERIOR CERVICAL DISCECTOMY AND FUSION   CARDIAC CATHETERIZATION  05/14/2017   hx ablation for A-flutter  11/01/2017   MITRAL VALVE REPAIR (MV)/CORONARY ARTERY BYPASS GRAFTING (CABG)  07/08/2017   SVG-OM, LAA closure, MV reconstruction with annuloplasty and sliding plasty via superior septal approach/Medtronic Simplici-T Annuloplasty System, left GSV harvest 07/08/17 by Gwenlyn Lento, [facility] at [facility]   TONSILLECTOMY     removed as a child   Patient Active Problem List   Diagnosis Date Noted   Cervical radiculopathy  03/10/2024   S/P lumbar fusion 12/01/2021   Lumbar stenosis 11/29/2021   S/P mitral valve repair 07/01/2018   OSA (obstructive sleep apnea) 07/01/2018   GERD (gastroesophageal reflux disease) 07/01/2018   Hyperlipidemia 07/01/2018   Erectile dysfunction 07/01/2018   Hypothyroid 07/01/2018    ONSET DATE: DOS 03/10/24  REFERRING DIAG: M84.132 (ICD-10-CM) - Left arm weakness   THERAPY DIAG:  Muscle weakness (generalized)  Stiffness of left shoulder, not elsewhere classified  Other lack of coordination  Rationale for Evaluation and Treatment: Rehabilitation  PERTINENT HISTORY: hx of other spinal fusion, past PT for LE weakness and decreased functional endurance in 2024.  Lt arm has been weak for approx 6 months or more  He states his Lt arm has been getting weaker over 1.5 years and finally had need for cervical fusion to prevent nerve compression. He states mainly bicep and deltoid weakness. He states having good FMS, but still needs to help with right arm to do buttons due to bicep weakness.  He has started to see some improvements in function after surgery.  PRECAUTIONS: Cervical and Other: No BLT >10#  RED FLAGS: None   WEIGHT BEARING RESTRICTIONS: Yes 10#   SUBJECTIVE:   SUBJECTIVE STATEMENT: Now 6  weeks s/p ANTERIOR CERVICAL DECOMPRESSION/DISCECTOMY FUSION 2 LEVELS (N/A) - C3-5 ANTERIOR CERVICAL DISCECTOMY AND FUSION   He states some back pain in the mid back and on the right side today  that is somewhat limiting.  OT feels like this is compensation to a certain degree.    PAIN:  Are you having pain?   No, fatigue is main issue   FALLS: Has patient fallen in last 6 months? No  PLOF: Independent  PATIENT GOALS: To improve use of left hand and arm for daily function   NEXT MD VISIT: 04/23/24   OBJECTIVE: (All objective assessments below are from initial evaluation on: 03/31/24 unless otherwise specified.)    HAND DOMINANCE: Ambidextrous  ADLs: Overall ADLs:  States decreased ability to grab, hold household objects, pain and difficulty to open containers, perform FMS tasks (manipulate fasteners on clothing), mild to moderate bathing problems as well.    FUNCTIONAL OUTCOME MEASURES: Eval: Patient Specific Functional Scale: 1.6 (washing hair, drinking out of a glass with left hand, picking up dog)  (Higher Score  =  Better Ability for the Selected Tasks)      UPPER EXTREMITY ROM     Shoulder to Wrist AROM Right eval Left eval LT 04/09/24 Lt 04/16/24 Lt 04/21/24  Shoulder flexion 154* 47 (with hiking)  51 54 54  Shoulder abduction 162* 72 (a lot of compensation) (140* PROM) 58 63   Shoulder extension 78 44  45 49  Shoulder internal rotation 52 44  48   Shoulder external rotation 77 75  80   Elbow flexion 155 144 151    Elbow extension (-5) (-5) (-5)     Forearm supination  76 78    Forearm pronation   85     Wrist flexion  36 39 54 45  Wrist extension  76 84 82 81  (Blank rows = not tested)   Hand AROM Left eval  Full Fist Ability (or Gap to Distal Palmar Crease) full  Thumb Opposition  (Kapandji Scale)  8/10  (Blank rows = not tested)   UPPER EXTREMITY MMT:     MMT Right 03/31/24 Left 03/31/24  Shoulder flexion 5/5 3-/5  Shoulder abduction 5/5 3-/5  Shoulder adduction    Shoulder extension  3-/5  Shoulder internal rotation  4+/5  Shoulder external rotation  4-/5  Middle trapezius  3-/5  Lower trapezius  3-/5  Elbow flexion 5/5 3/5  Elbow extension 5/5 4+/5  Forearm supination  3/5  Forearm pronation  5/5  Wrist flexion  4+/5  Wrist extension  5/5  Wrist ulnar deviation    Wrist radial deviation    (Blank rows = not tested)  HAND FUNCTION: Eval: Slight weakness in affected Lt hand.  Grip strength Right: 81 lbs, Left: 69 lbs   COORDINATION: Eval: Observed coordination impairments with affected Lt hand. Box and Blocks Test: Lt 42 Blocks today (45 is WFL, 65 is avg)   OBSERVATIONS:   Eval: No significant pain, has  a lot of compensatory shoulder hiking and scapular weakness with shoulder flexion and abduction.  A pattern of weakness in shoulder flexion, abduction that travels through the bicep, supinator.   TODAY'S TREATMENT:  04/21/24: Measure check today shows some stiffness through the wrist so he was recommended to continue wrist flexion stretches and small improvements in shoulder extension but flexion still a bit tight.  He has no specific functional concerns today so we continue on with dynamic functional strengthening and stability for the shoulder and arm as listed below.  He has no significant pain in the neck or the left arm during these but the right side of his mid back does feel a bit stiff and  cramping today at times and OT uses a percussion therapy to try to help that which he states is mildly helpful.  He required some rest breaks due to that to sit and rest his back.  He was also advised to start scar mobilizations on his neck scar as it looks dense and bulky and a lotion with a massage should help break that up safely.  UBE 3.8 resistance x6 mins (fnl push/pull) Wall p/u plus x15 Sh ER isometrics on wall x10 5 sec holds  Lat Rows 25# x20 Bicep curls 4.5# dowel 2 sets yellow Flexbar supination and wrist flexion x20 x 2 sets  UE Ranger x15  upward assisted reaching with postural cues, also x15 side-to-sde     04/16/24: He performs active range of motion at the start of the session for detailed new measures which shows good but slow improvements from the shoulder down to the hand and wrist.  His areas of weakness are improving, though he still has gross atrophy of the rotator cuff and hiking compensation at the shoulder.  We discussed continue safety with weightbearing and not trying to force head and neck motion, though he should be doing resistive training in the ways that the therapist is teaching him.  So far none of these things have been painful, and we review them and perform them today  again with modifications and using different types of equipment.  For example, instead of using therapy bands to perform shoulder rows, we use more typical "gym style" equipment that he would have access to in the community.  He tolerates the exercises and activities listed below for strengthening and scapular stabilization and neuromuscular reeducation.  He states understanding all exercises, leaves without any pain  Now doing scap protraction with 3# dowel x10-15 Sh flex/eccentrics now with 3# dowel  x10-15 Alternative sh ext and ER stretches Side-lying shoulder ER with cues for form x15 15# shoulder rows bil x15 Lat Rows 20# x15 Bicep curls 3# dowel PNF pattern D1 flex & ext x10    PATIENT EDUCATION: Education details: See tx section above for details  Person educated: Patient Education method: Engineer, structural, Teach back, Handouts  Education comprehension: States and demonstrates understanding, Additional Education required    HOME EXERCISE PROGRAM: Access Code: [email or web address] URL: [email or web address] Date: 03/31/2024 Prepared by: Leartis Proud   GOALS: Goals reviewed with patient? Yes   SHORT TERM GOALS: (STG required if POC>30 days) Target Date: 04/17/2024  Pt will demo/state understanding of initial HEP to improve pain levels and prerequisite motion. Goal status: 04/16/2024: Met   LONG TERM GOALS: Target Date: 05/15/2024  Pt will improve functional ability by decreased impairment per PSFS assessment from 1.6 to 5 or better, for better quality of life. Goal status: INITIAL  2.  Pt will improve A/ROM in left shoulder flexion from 47 degrees to at least 90 degrees, to have functional motion for tasks like reach and grasp.  Goal status: INITIAL  4.  Pt will improve strength in left bicep flexion from 3/5 MMT to at least 4/5 MMT to have increased functional ability to carry out selfcare and higher-level homecare tasks with less difficulty. Goal status:  INITIAL  5.  Pt will improve coordination skills in left hand and arm, as seen by within functional limit score on box and blocks testing to have increased functional ability to carry out fine motor tasks (fasteners, etc.) and more complex, coordinated IADLs (meal prep, sports, etc.).  Goal status: INITIAL    ASSESSMENT:  CLINICAL IMPRESSION: 04/21/24: Other than having a spasm or cramp in his back, he tolerates all exercises well and without pain.  He is showing signs of progress though it is slow.  He continues to struggle most with shoulder and scapular instability and external rotation weakness   04/16/24: It is excellent to see that he is making progress in terms of range of motion at the shoulder, elbow, wrist and hand.  He is also not having any pain, and function seems to be improving somewhat.  This is a long process and he is aware of that.    PLAN:  OT FREQUENCY: 1-2x/week  OT DURATION: 6 weeks through 05/15/2024 and up to 10 total visits  PLANNED INTERVENTIONS: 97535 self care/ADL training, 96045 therapeutic exercise, 97530 therapeutic activity, 97112 neuromuscular re-education, 97140 manual therapy, 97032 electrical stimulation (manual), 97760 Orthotic Initial, [id number] Orthotic/Prosthetic subsequent, coping strategies training, patient/family education, and DME and/or AE instructions  RECOMMENDED OTHER SERVICES: None now  CONSULTED AND AGREED WITH PLAN OF CARE: Patient  PLAN FOR NEXT SESSION:   Continue on as tolerated, also working on any spasms or pain that may be inhibiting.   Leartis Proud, OTR/L, CHT 04/21/2024, 5:39 PM

## 2024-04-21 ENCOUNTER — Ambulatory Visit: Admitting: Rehabilitative and Restorative Service Providers"

## 2024-04-21 ENCOUNTER — Encounter: Payer: Self-pay | Admitting: Rehabilitative and Restorative Service Providers"

## 2024-04-21 DIAGNOSIS — R278 Other lack of coordination: Secondary | ICD-10-CM | POA: Diagnosis not present

## 2024-04-21 DIAGNOSIS — M6281 Muscle weakness (generalized): Secondary | ICD-10-CM

## 2024-04-21 DIAGNOSIS — M25612 Stiffness of left shoulder, not elsewhere classified: Secondary | ICD-10-CM

## 2024-04-22 NOTE — Therapy (Signed)
 OUTPATIENT OCCUPATIONAL THERAPY TREATMENT NOTE  Patient Name: Dustin [name] MRN: [id number] DOB:10-Mar-1951, 73 y.o., male Today's Date: 04/23/2024  PCP: Lonzell Robin, MD REFERRING PROVIDER: Diedra Fowler, MD   END OF SESSION:  OT End of Session - 04/23/24 1351     Visit Number 6    Number of Visits 10    Date for OT Re-Evaluation 05/15/24    Authorization Type [employer]    OT Start Time 1351    OT Stop Time 1433    OT Time Calculation (min) 42 min    Activity Tolerance Patient tolerated treatment well;No increased pain;Patient limited by fatigue;Patient limited by pain    Behavior During Therapy [facility] for tasks assessed/performed              Past Medical History:  Diagnosis Date   Aortic regurgitation    moderate to severe AR 01/22/24   Arthritis    CKD (chronic kidney disease)    Coronary artery disease    GERD (gastroesophageal reflux disease) 07/01/2018   Heart murmur    stage 4, 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   Hypertension    Hypothyroidism 07/01/2018   Paroxysmal A-fib (HCC)    S/P mitral valve repair 07/08/2017   Spinal stenosis of lumbar region 08/22/2021   Past Surgical History:  Procedure Laterality Date   ANTERIOR CERVICAL DECOMP/DISCECTOMY FUSION N/A 03/10/2024   Procedure: ANTERIOR CERVICAL DECOMPRESSION/DISCECTOMY FUSION 2 LEVELS;  Surgeon: Diedra Fowler, MD;  Location: [facility] OR;  Service: Orthopedics;  Laterality: N/A;  C3-5 ANTERIOR CERVICAL DISCECTOMY AND FUSION   CARDIAC CATHETERIZATION  05/14/2017   hx ablation for A-flutter  11/01/2017   MITRAL VALVE REPAIR (MV)/CORONARY ARTERY BYPASS GRAFTING (CABG)  07/08/2017   SVG-OM, LAA closure, MV reconstruction with annuloplasty and sliding plasty via superior septal approach/Medtronic Simplici-T Annuloplasty System, left GSV harvest 07/08/17 by Gwenlyn Lento, [facility] at [facility]   TONSILLECTOMY     removed as a child   Patient Active Problem List   Diagnosis Date Noted   Cervical radiculopathy  03/10/2024   S/P lumbar fusion 12/01/2021   Lumbar stenosis 11/29/2021   S/P mitral valve repair 07/01/2018   OSA (obstructive sleep apnea) 07/01/2018   GERD (gastroesophageal reflux disease) 07/01/2018   Hyperlipidemia 07/01/2018   Erectile dysfunction 07/01/2018   Hypothyroid 07/01/2018    ONSET DATE: DOS 03/10/24  REFERRING DIAG: X52.841 (ICD-10-CM) - Left arm weakness   THERAPY DIAG:  Muscle weakness (generalized)  Stiffness of left shoulder, not elsewhere classified  Other lack of coordination  Other low back pain  Rationale for Evaluation and Treatment: Rehabilitation  PERTINENT HISTORY: hx of other spinal fusion, past PT for LE weakness and decreased functional endurance in 2024.  Lt arm has been weak for approx 6 months or more  He states his Lt arm has been getting weaker over 1.5 years and finally had need for cervical fusion to prevent nerve compression. He states mainly bicep and deltoid weakness. He states having good FMS, but still needs to help with right arm to do buttons due to bicep weakness.  He has started to see some improvements in function after surgery.  PRECAUTIONS: Cervical and Other: No BLT >10#  RED FLAGS: None   WEIGHT BEARING RESTRICTIONS: Yes 10#   SUBJECTIVE:   SUBJECTIVE STATEMENT: Now 6+  weeks s/p ANTERIOR CERVICAL DECOMPRESSION/DISCECTOMY FUSION 2 LEVELS (N/A) - C3-5 ANTERIOR CERVICAL DISCECTOMY AND FUSION   He states he would prefer not to have any more percussion  therapy on his back even if he is having a spasm.  It makes him nervous.  OT states that that is fine.  Fortunately he does not have any more muscle spasms today.    PAIN:  Are you having pain?    No, fatigue is main issue   FALLS: Has patient fallen in last 6 months? No  PLOF: Independent  PATIENT GOALS: To improve use of left hand and arm for daily function   NEXT MD VISIT: 04/23/24   OBJECTIVE: (All objective assessments below are from initial evaluation on:  03/31/24 unless otherwise specified.)    HAND DOMINANCE: Ambidextrous  ADLs: Overall ADLs: States decreased ability to grab, hold household objects, pain and difficulty to open containers, perform FMS tasks (manipulate fasteners on clothing), mild to moderate bathing problems as well.    FUNCTIONAL OUTCOME MEASURES: Eval: Patient Specific Functional Scale: 1.6 (washing hair, drinking out of a glass with left hand, picking up dog)  (Higher Score  =  Better Ability for the Selected Tasks)      UPPER EXTREMITY ROM     Shoulder to Wrist AROM Right eval Left eval LT 04/09/24 Lt 04/16/24 Lt 04/21/24  Shoulder flexion 154* 47 (with hiking)  51 54 54  Shoulder abduction 162* 72 (a lot of compensation) (140* PROM) 58 63   Shoulder extension 78 44  45 49  Shoulder internal rotation 52 44  48   Shoulder external rotation 77 75  80   Elbow flexion 155 144 151    Elbow extension (-5) (-5) (-5)     Forearm supination  76 78    Forearm pronation   85     Wrist flexion  36 39 54 45  Wrist extension  76 84 82 81  (Blank rows = not tested)   Hand AROM Left eval  Full Fist Ability (or Gap to Distal Palmar Crease) full  Thumb Opposition  (Kapandji Scale)  8/10  (Blank rows = not tested)   UPPER EXTREMITY MMT:     MMT Right 03/31/24 Left 03/31/24  Shoulder flexion 5/5 3-/5  Shoulder abduction 5/5 3-/5  Shoulder adduction    Shoulder extension  3-/5  Shoulder internal rotation  4+/5  Shoulder external rotation  4-/5  Middle trapezius  3-/5  Lower trapezius  3-/5  Elbow flexion 5/5 3/5  Elbow extension 5/5 4+/5  Forearm supination  3/5  Forearm pronation  5/5  Wrist flexion  4+/5  Wrist extension  5/5  Wrist ulnar deviation    Wrist radial deviation    (Blank rows = not tested)  HAND FUNCTION: Eval: Slight weakness in affected Lt hand.  Grip strength Right: 81 lbs, Left: 69 lbs   COORDINATION: Eval: Observed coordination impairments with affected Lt hand. Box and Blocks  Test: Lt 42 Blocks today (45 is WFL, 65 is avg)   OBSERVATIONS:   Eval: No significant pain, has a lot of compensatory shoulder hiking and scapular weakness with shoulder flexion and abduction.  A pattern of weakness in shoulder flexion, abduction that travels through the bicep, supinator.   TODAY'S TREATMENT:  04/23/24: OT continues to work on dynamic and functional strengthening, neuromuscular reeducation with scapular dyskinesia reeducation.  He performs the following list of activities:   UBE 4.0 resistance x6 mins (fnl push/pull) Prone scapular pro/retraction 2 sets of 10 on knees and elbows  Supine sh eccentric flexion 3# x 25 Body Blade flex/ext, ER/IR, abd/adduction x 5 mins  Front Rows 25# x25 Yellow  Flexbar supination and wrist flexion x20 x 2 sets  Ball on ramp,  ball in flexion  He has no significant pain or problems during these, just fatigue and needs rest breaks.  He is encouraged to do these or some form of these exercises at home frequently to encourage neuromuscular reeducation   PATIENT EDUCATION: Education details: See tx section above for details  Person educated: Patient Education method: Verbal Instruction, Teach back, Handouts  Education comprehension: States and demonstrates understanding, Additional Education required    HOME EXERCISE PROGRAM: Access Code: [email or web address] URL: [email or web address] Date: 03/31/2024 Prepared by: Leartis Proud   GOALS: Goals reviewed with patient? Yes   SHORT TERM GOALS: (STG required if POC>30 days) Target Date: 04/17/2024  Pt will demo/state understanding of initial HEP to improve pain levels and prerequisite motion. Goal status: 04/16/2024: Met   LONG TERM GOALS: Target Date: 05/15/2024  Pt will improve functional ability by decreased impairment per PSFS assessment from 1.6 to 5 or better, for better quality of life. Goal status: INITIAL  2.  Pt will improve A/ROM in left shoulder flexion from 47  degrees to at least 90 degrees, to have functional motion for tasks like reach and grasp.  Goal status: INITIAL  4.  Pt will improve strength in left bicep flexion from 3/5 MMT to at least 4/5 MMT to have increased functional ability to carry out selfcare and higher-level homecare tasks with less difficulty. Goal status: INITIAL  5.  Pt will improve coordination skills in left hand and arm, as seen by within functional limit score on box and blocks testing to have increased functional ability to carry out fine motor tasks (fasteners, etc.) and more complex, coordinated IADLs (meal prep, sports, etc.).  Goal status: INITIAL    ASSESSMENT:  CLINICAL IMPRESSION: 04/23/24: He continues to do well and seems to be tolerating more but it is a slow process, and he does fatigue quite easily still.  Fortunately he is not having any significant pains through his neck or surgical area or elsewhere today.   04/21/24: Other than having a spasm or cramp in his back, he tolerates all exercises well and without pain.  He is showing signs of progress though it is slow.  He continues to struggle most with shoulder and scapular instability and external rotation weakness   04/16/24: It is excellent to see that he is making progress in terms of range of motion at the shoulder, elbow, wrist and hand.  He is also not having any pain, and function seems to be improving somewhat.  This is a long process and he is aware of that.    PLAN:  OT FREQUENCY: 1-2x/week  OT DURATION: 6 weeks through 05/15/2024 and up to 10 total visits  PLANNED INTERVENTIONS: 97535 self care/ADL training, 81191 therapeutic exercise, 97530 therapeutic activity, 97112 neuromuscular re-education, 97140 manual therapy, 97032 electrical stimulation (manual), 97760 Orthotic Initial, 97763 Orthotic/Prosthetic subsequent, coping strategies training, patient/family education, and DME and/or AE instructions  RECOMMENDED OTHER SERVICES: None  now  CONSULTED AND AGREED WITH PLAN OF CARE: Patient  PLAN FOR NEXT SESSION:   Try shoulder eccentric's on inclined seat, check on MD follow-up note  Leartis Proud, OTR/L, CHT 04/23/2024, 4:28 PM

## 2024-04-23 ENCOUNTER — Ambulatory Visit: Admitting: Orthopedic Surgery

## 2024-04-23 ENCOUNTER — Ambulatory Visit: Admitting: Rehabilitative and Restorative Service Providers"

## 2024-04-23 ENCOUNTER — Encounter: Payer: Self-pay | Admitting: Rehabilitative and Restorative Service Providers"

## 2024-04-23 ENCOUNTER — Other Ambulatory Visit (INDEPENDENT_AMBULATORY_CARE_PROVIDER_SITE_OTHER): Payer: Self-pay

## 2024-04-23 DIAGNOSIS — R278 Other lack of coordination: Secondary | ICD-10-CM | POA: Diagnosis not present

## 2024-04-23 DIAGNOSIS — M25612 Stiffness of left shoulder, not elsewhere classified: Secondary | ICD-10-CM

## 2024-04-23 DIAGNOSIS — M5459 Other low back pain: Secondary | ICD-10-CM

## 2024-04-23 DIAGNOSIS — Z981 Arthrodesis status: Secondary | ICD-10-CM | POA: Diagnosis not present

## 2024-04-23 DIAGNOSIS — M6281 Muscle weakness (generalized): Secondary | ICD-10-CM

## 2024-04-23 NOTE — Progress Notes (Signed)
 Orthopedic Surgery Post-operative Office Visit   Procedure: C3-5 ACDF Date of Surgery: 03/10/2024 (~6 weeks post-op)   Assessment: Patient is a 73 y.o. male who does not have any neck or radicular pain but still has significant weakness in his left upper extremity proximally     Plan: -Operative plans complete -Okay to submerge wound at this point -Out of bed as tolerated, no collar -No bending/lifting/twisting greater than 10 pounds -Pain management: tylenol  as needed for pain relief - Continue to work with OT and do his home exercise program -Return to office in 6 weeks, x-rays needed at next visit: AP/lateral/flex/ex cervical   ___________________________________________________________________________     Subjective: Patient has been working with OT since our last visit.  He has been working on strengthening the left upper extremity.  He has noticed subtle improvement in his left arm strength.  He said he is better able to shave and brush his teeth with that arm.  He also has had an easier time with shampooing his hair.  He still has difficulty with using a hair dryer with that arm.  He does not have any pain radiating to either upper extremity.  He is not having any dysphagia.  He feels that his balance and walking has improved since our last visit.  Denies paresthesias and numbness.  Objective:   General: no acute distress, appropriate affect Neurologic: alert, answering questions appropriately, following commands Respiratory: unlabored breathing on room air Skin: incision is well healed   MSK (spine):   -Strength exam                                                   Left                  Right Grip strength                5/5                  5/5 Interosseus                  5/5                  5/5 Wrist extension            5/5                  5/5 Wrist flexion                 5/5                  5/5 Elbow flexion                4-/5                 5/5 Deltoid                           1/5                  5/5   EHL                              4/5  4/5 TA                                 5/5                  5/5 GSC                             5/5                  5/5 Knee extension            5/5                  5/5 Hip flexion                    5/5                  5/5   -Sensory exam                           Sensation intact to light touch in L2-S1 nerve distributions of bilateral lower extremities             Sensation intact to light touch in C4-T1 nerve distributions of bilateral upper extremities     Imaging: X-rays of the cervical spine taken 04/23/2024 were independently reviewed and interpreted, showing anterior cervical instrumentation from C3-5.  No lucency seen around the instrumentation.  There are interbody devices at C3/4 and C4/5 near the anterior aspect of the former disc space.  Disc height loss at C5/6 and C6/7.  No fracture or dislocation seen.    Patient name: Dustin [name] Patient MRN: [id number] Date of visit: 04/23/24

## 2024-04-24 NOTE — Therapy (Signed)
 OUTPATIENT OCCUPATIONAL THERAPY TREATMENT NOTE  Patient Name: Dustin [name] MRN: [id number] DOB:01-19-1951, 73 y.o., male Today's Date: 04/28/2024  PCP: Lonzell Robin, MD REFERRING PROVIDER: Diedra Fowler, MD   END OF SESSION:  OT End of Session - 04/28/24 1301     Visit Number 7    Number of Visits 10    Date for OT Re-Evaluation 05/15/24    Authorization Type [employer]    OT Start Time 1301    OT Stop Time 1343    OT Time Calculation (min) 42 min    Activity Tolerance Patient tolerated treatment well;No increased pain;Patient limited by fatigue;Patient limited by pain    Behavior During Therapy [facility] for tasks assessed/performed               Past Medical History:  Diagnosis Date   Aortic regurgitation    moderate to severe AR 01/22/24   Arthritis    CKD (chronic kidney disease)    Coronary artery disease    GERD (gastroesophageal reflux disease) 07/01/2018   Heart murmur    stage 4, 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   Hypertension    Hypothyroidism 07/01/2018   Paroxysmal A-fib (HCC)    S/P mitral valve repair 07/08/2017   Spinal stenosis of lumbar region 08/22/2021   Past Surgical History:  Procedure Laterality Date   ANTERIOR CERVICAL DECOMP/DISCECTOMY FUSION N/A 03/10/2024   Procedure: ANTERIOR CERVICAL DECOMPRESSION/DISCECTOMY FUSION 2 LEVELS;  Surgeon: Diedra Fowler, MD;  Location: [facility] OR;  Service: Orthopedics;  Laterality: N/A;  C3-5 ANTERIOR CERVICAL DISCECTOMY AND FUSION   CARDIAC CATHETERIZATION  05/14/2017   hx ablation for A-flutter  11/01/2017   MITRAL VALVE REPAIR (MV)/CORONARY ARTERY BYPASS GRAFTING (CABG)  07/08/2017   SVG-OM, LAA closure, MV reconstruction with annuloplasty and sliding plasty via superior septal approach/Medtronic Simplici-T Annuloplasty System, left GSV harvest 07/08/17 by Gwenlyn Lento, [facility] at [facility]   TONSILLECTOMY     removed as a child   Patient Active Problem List   Diagnosis Date Noted   Cervical radiculopathy  03/10/2024   S/P lumbar fusion 12/01/2021   Lumbar stenosis 11/29/2021   S/P mitral valve repair 07/01/2018   OSA (obstructive sleep apnea) 07/01/2018   GERD (gastroesophageal reflux disease) 07/01/2018   Hyperlipidemia 07/01/2018   Erectile dysfunction 07/01/2018   Hypothyroid 07/01/2018    ONSET DATE: DOS 03/10/24  REFERRING DIAG: W09.811 (ICD-10-CM) - Left arm weakness   THERAPY DIAG:  Muscle weakness (generalized)  Stiffness of left shoulder, not elsewhere classified  Other lack of coordination  Rationale for Evaluation and Treatment: Rehabilitation  PERTINENT HISTORY: hx of other spinal fusion, past PT for LE weakness and decreased functional endurance in 2024.  Lt arm has been weak for approx 6 months or more  He states his Lt arm has been getting weaker over 1.5 years and finally had need for cervical fusion to prevent nerve compression. He states mainly bicep and deltoid weakness. He states having good FMS, but still needs to help with right arm to do buttons due to bicep weakness.  He has started to see some improvements in function after surgery.  PRECAUTIONS: Cervical and Other: No BLT >10#  RED FLAGS: None   WEIGHT BEARING RESTRICTIONS: Yes 10#   SUBJECTIVE:   SUBJECTIVE STATEMENT: Now 7  weeks s/p ANTERIOR CERVICAL DECOMPRESSION/DISCECTOMY FUSION 2 LEVELS (N/A) - C3-5 ANTERIOR CERVICAL DISCECTOMY AND FUSION   He states MD apt went well, and he reiterates that he wants to avoid percussion  therapy.    he would prefer not to have any more percussion therapy on his back even if he is having a spasm.  It makes him nervous.  OT states that that is fine.  Fortunately he does not have any more muscle spasms today.    PAIN:  Are you having pain?     No, fatigue is main issue   FALLS: Has patient fallen in last 6 months? No  PLOF: Independent  PATIENT GOALS: To improve use of left hand and arm for daily function   NEXT MD VISIT: 04/23/24   OBJECTIVE: (All  objective assessments below are from initial evaluation on: 03/31/24 unless otherwise specified.)    HAND DOMINANCE: Ambidextrous  ADLs: Overall ADLs: States decreased ability to grab, hold household objects, pain and difficulty to open containers, perform FMS tasks (manipulate fasteners on clothing), mild to moderate bathing problems as well.    FUNCTIONAL OUTCOME MEASURES: Eval: Patient Specific Functional Scale: 1.6 (washing hair, drinking out of a glass with left hand, picking up dog)  (Higher Score  =  Better Ability for the Selected Tasks)      UPPER EXTREMITY ROM     Shoulder to Wrist AROM Right eval Left eval LT 04/09/24 Lt 04/16/24 Lt 04/21/24  Shoulder flexion 154* 47 (with hiking)  51 54 54  Shoulder abduction 162* 72 (a lot of compensation) (140* PROM) 58 63   Shoulder extension 78 44  45 49  Shoulder internal rotation 52 44  48   Shoulder external rotation 77 75  80   Elbow flexion 155 144 151    Elbow extension (-5) (-5) (-5)     Forearm supination  76 78    Forearm pronation   85     Wrist flexion  36 39 54 45  Wrist extension  76 84 82 81  (Blank rows = not tested)   Hand AROM Left eval  Full Fist Ability (or Gap to Distal Palmar Crease) full  Thumb Opposition  (Kapandji Scale)  8/10  (Blank rows = not tested)   UPPER EXTREMITY MMT:     MMT Right 03/31/24 Left 03/31/24  Shoulder flexion 5/5 3-/5  Shoulder abduction 5/5 3-/5  Shoulder adduction    Shoulder extension  3-/5  Shoulder internal rotation  4+/5  Shoulder external rotation  4-/5  Middle trapezius  3-/5  Lower trapezius  3-/5  Elbow flexion 5/5 3/5  Elbow extension 5/5 4+/5  Forearm supination  3/5  Forearm pronation  5/5  Wrist flexion  4+/5  Wrist extension  5/5  Wrist ulnar deviation    Wrist radial deviation    (Blank rows = not tested)  HAND FUNCTION: Eval: Slight weakness in affected Lt hand.  Grip strength Right: 81 lbs, Left: 69 lbs   COORDINATION: Eval: Observed  coordination impairments with affected Lt hand. Box and Blocks Test: Lt 42 Blocks today (45 is WFL, 65 is avg)   OBSERVATIONS:   Eval: No significant pain, has a lot of compensatory shoulder hiking and scapular weakness with shoulder flexion and abduction.  A pattern of weakness in shoulder flexion, abduction that travels through the bicep, supinator.   TODAY'S TREATMENT:  04/28/24: We continue to work on weakness and neuromuscular re-education and scapular stabilization in the ways below. He does tolerate 45* angle chest press and eccentrics today, which is a big new challenge.   He needs rest breaks and definitely does fatigue at the end of the session.   Prone  scapular "pops" 2 sets of 15 reps  45* incline sh eccentric flexion  2 sets of 15  Supinated rowing: 20#  2x 15 reps Biceps flexion (machine):  5# x 15  Supination with yellow flexbar 2x 15 UBE 4.5 resistance x5 mins (fnl push/pull)    PATIENT EDUCATION: Education details: See tx section above for details  Person educated: Patient Education method: Engineer, structural, Teach back, Handouts  Education comprehension: States and demonstrates understanding, Additional Education required    HOME EXERCISE PROGRAM: Access Code: [email or web address] URL: [email or web address] Date: 03/31/2024 Prepared by: Leartis Proud   GOALS: Goals reviewed with patient? Yes   SHORT TERM GOALS: (STG required if POC>30 days) Target Date: 04/17/2024  Pt will demo/state understanding of initial HEP to improve pain levels and prerequisite motion. Goal status: 04/16/2024: Met   LONG TERM GOALS: Target Date: 05/15/2024  Pt will improve functional ability by decreased impairment per PSFS assessment from 1.6 to 5 or better, for better quality of life. Goal status: INITIAL  2.  Pt will improve A/ROM in left shoulder flexion from 47 degrees to at least 90 degrees, to have functional motion for tasks like reach and grasp.  Goal status:  INITIAL  4.  Pt will improve strength in left bicep flexion from 3/5 MMT to at least 4/5 MMT to have increased functional ability to carry out selfcare and higher-level homecare tasks with less difficulty. Goal status: INITIAL  5.  Pt will improve coordination skills in left hand and arm, as seen by within functional limit score on box and blocks testing to have increased functional ability to carry out fine motor tasks (fasteners, etc.) and more complex, coordinated IADLs (meal prep, sports, etc.).  Goal status: INITIAL    ASSESSMENT:  CLINICAL IMPRESSION: 04/28/24: Continues to improve postures, strength, endurance   04/23/24: He continues to do well and seems to be tolerating more but it is a slow process, and he does fatigue quite easily still.  Fortunately he is not having any significant pains through his neck or surgical area or elsewhere today.     PLAN:  OT FREQUENCY: 1-2x/week  OT DURATION: 6 weeks through 05/15/2024 and up to 10 total visits  PLANNED INTERVENTIONS: 97535 self care/ADL training, 54098 therapeutic exercise, 97530 therapeutic activity, 97112 neuromuscular re-education, 97140 manual therapy, 97032 electrical stimulation (manual), 97760 Orthotic Initial, 97763 Orthotic/Prosthetic subsequent, coping strategies training, patient/family education, and DME and/or AE instructions  RECOMMENDED OTHER SERVICES: None now  CONSULTED AND AGREED WITH PLAN OF CARE: Patient  PLAN FOR NEXT SESSION:   Continue to work toward goals    [employer], OTR/L, CHT 04/28/2024, 1:44 PM

## 2024-04-28 ENCOUNTER — Ambulatory Visit: Admitting: Rehabilitative and Restorative Service Providers"

## 2024-04-28 ENCOUNTER — Encounter: Payer: Self-pay | Admitting: Rehabilitative and Restorative Service Providers"

## 2024-04-28 DIAGNOSIS — R278 Other lack of coordination: Secondary | ICD-10-CM | POA: Diagnosis not present

## 2024-04-28 DIAGNOSIS — M25612 Stiffness of left shoulder, not elsewhere classified: Secondary | ICD-10-CM

## 2024-04-28 DIAGNOSIS — M6281 Muscle weakness (generalized): Secondary | ICD-10-CM | POA: Diagnosis not present

## 2024-05-01 NOTE — Therapy (Signed)
 OUTPATIENT OCCUPATIONAL THERAPY TREATMENT NOTE  Patient Name: Dustin [name] MRN: [id number] DOB:Jun 09, 1951, 73 y.o., male Today's Date: 05/05/2024  PCP: Lonzell Robin, MD REFERRING PROVIDER: Diedra Fowler, MD   END OF SESSION:  OT End of Session - 05/05/24 1301     Visit Number 8    Number of Visits 10    Date for OT Re-Evaluation 05/15/24    Authorization Type [employer]    OT Start Time 1301    OT Stop Time 1343    OT Time Calculation (min) 42 min    Activity Tolerance Patient tolerated treatment well;No increased pain;Patient limited by fatigue    Behavior During Therapy [facility] for tasks assessed/performed             Past Medical History:  Diagnosis Date   Aortic regurgitation    moderate to severe AR 01/22/24   Arthritis    CKD (chronic kidney disease)    Coronary artery disease    GERD (gastroesophageal reflux disease) 07/01/2018   Heart murmur    stage 4, 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   Hypertension    Hypothyroidism 07/01/2018   Paroxysmal A-fib (HCC)    S/P mitral valve repair 07/08/2017   Spinal stenosis of lumbar region 08/22/2021   Past Surgical History:  Procedure Laterality Date   ANTERIOR CERVICAL DECOMP/DISCECTOMY FUSION N/A 03/10/2024   Procedure: ANTERIOR CERVICAL DECOMPRESSION/DISCECTOMY FUSION 2 LEVELS;  Surgeon: Diedra Fowler, MD;  Location: [facility] OR;  Service: Orthopedics;  Laterality: N/A;  C3-5 ANTERIOR CERVICAL DISCECTOMY AND FUSION   CARDIAC CATHETERIZATION  05/14/2017   hx ablation for A-flutter  11/01/2017   MITRAL VALVE REPAIR (MV)/CORONARY ARTERY BYPASS GRAFTING (CABG)  07/08/2017   SVG-OM, LAA closure, MV reconstruction with annuloplasty and sliding plasty via superior septal approach/Medtronic Simplici-T Annuloplasty System, left GSV harvest 07/08/17 by Gwenlyn Lento, [facility]   TONSILLECTOMY     removed as a child   Patient Active Problem List   Diagnosis Date Noted   Cervical radiculopathy 03/10/2024   S/P lumbar  fusion 12/01/2021   Lumbar stenosis 11/29/2021   S/P mitral valve repair 07/01/2018   OSA (obstructive sleep apnea) 07/01/2018   GERD (gastroesophageal reflux disease) 07/01/2018   Hyperlipidemia 07/01/2018   Erectile dysfunction 07/01/2018   Hypothyroid 07/01/2018    ONSET DATE: DOS 03/10/24  REFERRING DIAG: N82.956 (ICD-10-CM) - Left arm weakness   THERAPY DIAG:  Muscle weakness (generalized)  Stiffness of left shoulder, not elsewhere classified  Other lack of coordination  Rationale for Evaluation and Treatment: Rehabilitation  PERTINENT HISTORY: hx of other spinal fusion, past PT for LE weakness and decreased functional endurance in 2024.  Lt arm has been weak for approx 6 months or more  He states his Lt arm has been getting weaker over 1.5 years and finally had need for cervical fusion to prevent nerve compression. He states mainly bicep and deltoid weakness. He states having good FMS, but still needs to help with right arm to do buttons due to bicep weakness.  He has started to see some improvements in function after surgery.  PRECAUTIONS: Cervical and Other: No BLT >10#  RED FLAGS: None   WEIGHT BEARING RESTRICTIONS: Yes 10#   SUBJECTIVE:   SUBJECTIVE STATEMENT: Now 8  weeks s/p ANTERIOR CERVICAL DECOMPRESSION/DISCECTOMY FUSION 2 LEVELS (N/A) - C3-5 ANTERIOR CERVICAL DISCECTOMY AND FUSION   He states not having any significant pain or issues when coming in the door today.  He states he continues  to do functional activities and even was able to lift his 25 pound dog at home recently.    PAIN:  Are you having pain?     No, fatigue is main issue   FALLS: Has patient fallen in last 6 months? No  PLOF: Independent  PATIENT GOALS: To improve use of left hand and arm for daily function   NEXT MD VISIT: 04/23/24   OBJECTIVE: (All objective assessments below are from initial evaluation on: 03/31/24 unless otherwise specified.)    HAND DOMINANCE:  Ambidextrous  ADLs: Overall ADLs: States decreased ability to grab, hold household objects, pain and difficulty to open containers, perform FMS tasks (manipulate fasteners on clothing), mild to moderate bathing problems as well.    FUNCTIONAL OUTCOME MEASURES: Eval: Patient Specific Functional Scale: 1.6 (washing hair, drinking out of a glass with left hand, picking up dog)  (Higher Score  =  Better Ability for the Selected Tasks)      UPPER EXTREMITY ROM     Shoulder to Wrist AROM Right eval Left eval LT 04/09/24 Lt 04/16/24 Lt 04/21/24 Lt 05/12/24  Shoulder flexion 154* 47 (with hiking)  51 54 54   Shoulder abduction 162* 72 (a lot of compensation) (140* PROM) 58 63    Shoulder extension 78 44  45 49   Shoulder internal rotation 52 44  48    Shoulder external rotation 77 75  80    Elbow flexion 155 144 151     Elbow extension (-5) (-5) (-5)      Forearm supination  76 78     Forearm pronation   85      Wrist flexion  36 39 54 45   Wrist extension  76 84 82 81   (Blank rows = not tested)   Hand AROM Left eval  Full Fist Ability (or Gap to Distal Palmar Crease) full  Thumb Opposition  (Kapandji Scale)  8/10  (Blank rows = not tested)   UPPER EXTREMITY MMT:     MMT Right 03/31/24 Left 03/31/24  Shoulder flexion 5/5 3-/5  Shoulder abduction 5/5 3-/5  Shoulder adduction    Shoulder extension  3-/5  Shoulder internal rotation  4+/5  Shoulder external rotation  4-/5  Middle trapezius  3-/5  Lower trapezius  3-/5  Elbow flexion 5/5 3/5  Elbow extension 5/5 4+/5  Forearm supination  3/5  Forearm pronation  5/5  Wrist flexion  4+/5  Wrist extension  5/5  Wrist ulnar deviation    Wrist radial deviation    (Blank rows = not tested)  HAND FUNCTION: Eval: Slight weakness in affected Lt hand.  Grip strength Right: 81 lbs, Left: 69 lbs   COORDINATION: Eval: Observed coordination impairments with affected Lt hand. Box and Blocks Test: Lt 42 Blocks today (45 is WFL, 65  is avg)   OBSERVATIONS:   Eval: No significant pain, has a lot of compensatory shoulder hiking and scapular weakness with shoulder flexion and abduction.  A pattern of weakness in shoulder flexion, abduction that travels through the bicep, supinator.   TODAY'S TREATMENT:  05/05/24: He continues to work on areas of specific weakness and functional weakness with the tasks below.  He needs modifications at times and cues for form and when to stop and stretch, sometimes to breathe and relax.  He is doing well, and was asked to test the waters for his next session because it is a progress note to determine if he needs any more therapy.  He was asked to do his best at home with activities and come back ready to discuss it in the next session.    Seated Rows 25# x15; 35# x 10; 55# x 10  45* incline sh eccentric flexion  2 sets of 15  FA supination stretches Wrist flexion stretches Wrist flexion strength 2 sets x3# x15 reps  Biceps flexion (machine):  5# x 15  FNL reaching activity into cabinets at various heights with  1#      04/28/24: We continue to work on weakness and neuromuscular re-education and scapular stabilization in the ways below. He does tolerate 45* angle chest press and eccentrics today, which is a big new challenge.   He needs rest breaks and definitely does fatigue at the end of the session.   Prone scapular pops 2 sets of 15 reps  45* incline sh eccentric flexion  2 sets of 15  Supinated rowing: 20#  2x 15 reps Biceps flexion (machine):  5# x 15  Supination with yellow flexbar 2x 15 UBE 4.5 resistance x5 mins (fnl push/pull)    PATIENT EDUCATION: Education details: See tx section above for details  Person educated: Patient Education method: Engineer, structural, Teach back, Handouts  Education comprehension: States and demonstrates understanding, Additional Education required    HOME EXERCISE PROGRAM: Access Code: [email or web address] URL:  [email or web address] Date: 03/31/2024 Prepared by: Leartis Proud   GOALS: Goals reviewed with patient? Yes   SHORT TERM GOALS: (STG required if POC>30 days) Target Date: 04/17/2024  Pt will demo/state understanding of initial HEP to improve pain levels and prerequisite motion. Goal status: 04/16/2024: Met   LONG TERM GOALS: Target Date: 05/15/2024  Pt will improve functional ability by decreased impairment per PSFS assessment from 1.6 to 5 or better, for better quality of life. Goal status: INITIAL  2.  Pt will improve A/ROM in left shoulder flexion from 47 degrees to at least 90 degrees, to have functional motion for tasks like reach and grasp.  Goal status: INITIAL  4.  Pt will improve strength in left bicep flexion from 3/5 MMT to at least 4/5 MMT to have increased functional ability to carry out selfcare and higher-level homecare tasks with less difficulty. Goal status: INITIAL  5.  Pt will improve coordination skills in left hand and arm, as seen by within functional limit score on box and blocks testing to have increased functional ability to carry out fine motor tasks (fasteners, etc.) and more complex, coordinated IADLs (meal prep, sports, etc.).  Goal status: INITIAL    ASSESSMENT:  CLINICAL IMPRESSION: 05/05/24: Continues to do well, no pain, tolerating more weight, doing better with home tasks, carry on   04/28/24: Continues to improve postures, strength, endurance   04/23/24: He continues to do well and seems to be tolerating more but it is a slow process, and he does fatigue quite easily still.  Fortunately he is not having any significant pains through his neck or surgical area or elsewhere today.     PLAN:  OT FREQUENCY: 1-2x/week  OT DURATION: 6 weeks through 05/15/2024 and up to 10 total visits  PLANNED INTERVENTIONS: 97535 self care/ADL training, 54098 therapeutic exercise, 97530 therapeutic activity, 97112 neuromuscular re-education,  97140 manual therapy, 97032 electrical stimulation (manual), 97760 Orthotic Initial, [id number] Orthotic/Prosthetic subsequent, coping strategies training, patient/family education, and DME and/or AE instructions  RECOMMENDED OTHER SERVICES: None now  CONSULTED AND AGREED WITH PLAN OF CARE: Patient  PLAN FOR NEXT SESSION:   PROG note  Leartis Proud, OTR/L, CHT 05/05/2024, 1:47 PM

## 2024-05-05 ENCOUNTER — Encounter: Payer: Self-pay | Admitting: Rehabilitative and Restorative Service Providers"

## 2024-05-05 ENCOUNTER — Ambulatory Visit: Admitting: Rehabilitative and Restorative Service Providers"

## 2024-05-05 DIAGNOSIS — R278 Other lack of coordination: Secondary | ICD-10-CM | POA: Diagnosis not present

## 2024-05-05 DIAGNOSIS — M6281 Muscle weakness (generalized): Secondary | ICD-10-CM

## 2024-05-05 DIAGNOSIS — M25612 Stiffness of left shoulder, not elsewhere classified: Secondary | ICD-10-CM | POA: Diagnosis not present

## 2024-05-11 NOTE — Therapy (Signed)
 OUTPATIENT OCCUPATIONAL THERAPY TREATMENT & PROGRESS NOTE  Patient Name: Dustin [name] MRN: [id number] DOB:February 14, 1951, 73 y.o., male Today's Date: 05/12/2024  PCP: Xenia Devonshire, MD REFERRING PROVIDER: Georgina Ozell LABOR, MD                Progress Note Reporting Period 03/31/24 to 05/12/24  CLINICAL IMPRESSION: 05/12/24: He has met 2 out of 4 of his long-term goals now, and both of these goals will be upgraded.  He has improved his ability for the remaining 2 goals as well, but progress is slow.  He states getting a benefit from outpatient occupational therapy and desiring to continue on to work towards all goals.  We will continue on the for 6 more weeks of therapy as needed.    PLAN:  OT FREQUENCY: 1x week  OT DURATION:: Up to 6 additional weeks from 05/08/2024 through 06/26/2024 and up to 15 total visits as needed  See note below for Objective Data and Assessment of Progress/Goals.                        END OF SESSION:  OT End of Session - 05/12/24 1348     Visit Number 9    Number of Visits 15    Date for OT Re-Evaluation 06/26/24    Authorization Type [employer]    OT Start Time 1348    OT Stop Time 1438    OT Time Calculation (min) 50 min    Activity Tolerance Patient tolerated treatment well;No increased pain;Patient limited by fatigue    Behavior During Therapy [facility] for tasks assessed/performed              Past Medical History:  Diagnosis Date   Aortic regurgitation    moderate to severe AR 01/22/24   Arthritis    CKD (chronic kidney disease)    Coronary artery disease    GERD (gastroesophageal reflux disease) 07/01/2018   Heart murmur    stage 4, 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   Hypertension    Hypothyroidism 07/01/2018   Paroxysmal A-fib (HCC)    S/P mitral valve repair 07/08/2017   Spinal stenosis of lumbar region 08/22/2021   Past Surgical History:  Procedure Laterality Date   ANTERIOR CERVICAL  DECOMP/DISCECTOMY FUSION N/A 03/10/2024   Procedure: ANTERIOR CERVICAL DECOMPRESSION/DISCECTOMY FUSION 2 LEVELS;  Surgeon: Georgina Ozell LABOR, MD;  Location: [facility] OR;  Service: Orthopedics;  Laterality: N/A;  C3-5 ANTERIOR CERVICAL DISCECTOMY AND FUSION   CARDIAC CATHETERIZATION  05/14/2017   hx ablation for A-flutter  11/01/2017   MITRAL VALVE REPAIR (MV)/CORONARY ARTERY BYPASS GRAFTING (CABG)  07/08/2017   SVG-OM, LAA closure, MV reconstruction with annuloplasty and sliding plasty via superior septal approach/Medtronic Simplici-T Annuloplasty System, left GSV harvest 07/08/17 by Rosalynn Dines, MD at [facility]   TONSILLECTOMY     removed as a child   Patient Active Problem List   Diagnosis Date Noted   Cervical radiculopathy 03/10/2024   S/P lumbar fusion 12/01/2021   Lumbar stenosis 11/29/2021   S/P mitral valve repair 07/01/2018   OSA (obstructive sleep apnea) 07/01/2018   GERD (gastroesophageal reflux disease) 07/01/2018   Hyperlipidemia 07/01/2018   Erectile dysfunction 07/01/2018   Hypothyroid 07/01/2018    ONSET DATE: DOS 03/10/24  REFERRING DIAG: M70.101 (ICD-10-CM) - Left arm weakness   THERAPY DIAG:  Muscle weakness (generalized) - Plan: Ot plan of care cert/re-cert  Stiffness of left shoulder, not elsewhere classified - Plan: Ot plan of  care cert/re-cert  Other lack of coordination - Plan: Ot plan of care cert/re-cert  Rationale for Evaluation and Treatment: Rehabilitation  PERTINENT HISTORY: hx of other spinal fusion, past PT for LE weakness and decreased functional endurance in 2024.  Lt arm has been weak for approx 6 months or more  He states his Lt arm has been getting weaker over 1.5 years and finally had need for cervical fusion to prevent nerve compression. He states mainly bicep and deltoid weakness. He states having good FMS, but still needs to help with right arm to do buttons due to bicep weakness.  He has started to see some improvements in function after  surgery.  PRECAUTIONS: Cervical and Other: No BLT >10#  RED FLAGS: None   WEIGHT BEARING RESTRICTIONS: Yes 10#   SUBJECTIVE:   SUBJECTIVE STATEMENT: Now 9  weeks s/p ANTERIOR CERVICAL DECOMPRESSION/DISCECTOMY FUSION 2 LEVELS (N/A) - C3-5 ANTERIOR CERVICAL DISCECTOMY AND FUSION   He states feeling of benefit from outpatient occupational therapy and wanting to continue with therapy to work towards all of his long-term goals.  Fortunately he still has had no significant pain      PAIN:  Are you having pain?     No, fatigue is main issue    PATIENT GOALS: To improve use of left hand and arm for daily function   NEXT MD VISIT: 04/23/24   OBJECTIVE: (All objective assessments below are from initial evaluation on: 03/31/24 unless otherwise specified.)    HAND DOMINANCE: Ambidextrous  ADLs: Overall ADLs: States some improved ability to perform grooming, self-care, home care, eat and drink using his left upper extremity, but still having some difficulties.    FUNCTIONAL OUTCOME MEASURES: 05/12/24: PSFS 5 today   Eval: Patient Specific Functional Scale: 1.6 (washing hair, drinking out of a glass with left hand, picking up dog)  (Higher Score  =  Better Ability for the Selected Tasks)      UPPER EXTREMITY ROM     Shoulder to Wrist AROM Right eval Left eval LT 04/09/24 Lt 04/16/24 Lt 04/21/24 Lt 05/12/24  Shoulder flexion 154* 47 (with hiking)  51 54 54 59  Shoulder abduction 162* 72 (a lot of compensation) (140* PROM) 58 63  64  Shoulder extension 78 44  45 49 45  Shoulder internal rotation 52 44  48  34  Shoulder external rotation 77 75  80  80 WNL  Elbow flexion 155 144 151   146 WNL   Elbow extension (-5) (-5) (-5)    (-10)  (Rt side is (-15))   WNL   Forearm supination  76 78   70  Forearm pronation   85    85  Wrist flexion  36 39 54 45 45  Wrist extension  76 84 82 81 85  (Blank rows = not tested)   Hand AROM Left eval  Full Fist Ability (or Gap to Distal  Palmar Crease) full  Thumb Opposition  (Kapandji Scale)  8/10  (Blank rows = not tested)   UPPER EXTREMITY MMT:     MMT Right 03/31/24 Left 03/31/24 Lt 05/12/24  Shoulder flexion 5/5 3-/5 4-/5  Shoulder abduction 5/5 3-/5 3-/5  Shoulder adduction     Shoulder extension  3-/5 4+/5  Shoulder internal rotation  4+/5 4+/5  Shoulder external rotation  4-/5 4-/5  Middle trapezius  3-/5   Lower trapezius  3-/5   Elbow flexion 5/5 3/5 3+/5  Elbow extension 5/5 4+/5 5/5  Forearm supination  3/5 3/5  Forearm pronation  5/5 5/5  Wrist flexion  4+/5 5/5 (in neutral)   Wrist extension  5/5 5/5  Wrist ulnar deviation     Wrist radial deviation     (Blank rows = not tested)  HAND FUNCTION: 05/12/24: Grip Lt: 65.2#   Eval: Slight weakness in affected Lt hand.  Grip strength Right: 81 lbs, Left: 69 lbs   COORDINATION: 05/12/24: BBT Lt: 46 blocks,  (45 is WFL, 65 is avg)  Eval: Observed coordination impairments with affected Lt hand. Box and Blocks Test: Lt 42 Blocks today (45 is WFL, 65 is avg)   OBSERVATIONS:   Eval: No significant pain, has a lot of compensatory shoulder hiking and scapular weakness with shoulder flexion and abduction.  A pattern of weakness in shoulder flexion, abduction that travels through the bicep, supinator.   TODAY'S TREATMENT:  05/12/24: Pt performs AROM, gripping, and strength with left arm against therapist's resistance for exercise/activities as well as new measures today.  These measures show small amounts of progress with shoulder motion, elbow, hand, forearm and wrist motion, as well as some gains in strength in the weak left arm.  Using these measurements, OT adds new ideas today for sleeper stretch as well as shoulder abduction strengthening as bolded below.  His other home exercises were also reviewed including ways to modify these.  We reviewed the idea of not performing scapular hiking or compensatory motions, but focusing on smooth and good motion  with tight shoulder and scapular positions.  Pt states understanding and tolerates upgrades well.   He was reminded to use his arm in all functional tasks that are safe for him to promote motion, and goals were reviewed with the patient.  He states appreciating the cues that he gets in therapy and needing more time to meet long-term goals and request to continue occupational therapy to work on overall ability with the left arm.   Exercises - Seated Scapular Retraction  - 4-6 x daily - 5-10 reps - Prone Scapular Protraction Retraction AROM on Forearms  - 4-6 x daily - 1 sets - 10-15 reps - Sleeper Stretch  - 3-4 x daily - 5 reps - 15-20 sec hold - shoulder stretch overhead  - 4-5 x daily - 3 reps - 15 sec hold - Supine Shoulder Press AAROM in Abduction with Dowel  - 4-5 x daily - 1-2 sets - 5-10 reps - Seated Shoulder W External Rotation on Swiss Ball  - 4-6 x daily - 1 sets - 10-15 reps - Seated Shoulder Row with Anchored Resistance  - 4-6 x daily - 1 sets - 10-15 reps - Isometric Shoulder Abduction with Ball - Arm Straight at Wall  - 4-6 x daily - 1 sets - 10-15 reps - Seated Bicep Curls with Bar  - 4-6 x daily - 1 sets - 10-15 reps - Standing Single Arm Bicep Curls Neutral with Dumbbell  - 4-6 x daily - 1 sets - 10-15 reps - Forearm Pronation and Supination with Hammer  - 4-6 x daily - 1-2 sets - 10-15 reps - Wrist Flexion with Dumbbell  - 4-6 x daily - 1 sets - 10-15 reps - Seated Single Arm Shoulder PNF D1 Flexion  - 4 x daily - 10 reps   PATIENT EDUCATION: Education details: See tx section above for details  Person educated: Patient Education method: Verbal Instruction, Teach back, Handouts  Education comprehension: States and demonstrates understanding, Additional Education required    HOME  EXERCISE PROGRAM: Access Code: [email or web address] URL: [email or web address] Date: 03/31/2024 Prepared by: Melvenia Ada   GOALS: Goals reviewed with patient? Yes   SHORT TERM  GOALS: (STG required if POC>30 days) Target Date: 04/17/2024  Pt will demo/state understanding of initial HEP to improve pain levels and prerequisite motion. Goal status: 04/16/2024: Met   LONG TERM GOALS: Target Date: 05/15/2024  Pt will improve functional ability by decreased impairment per PSFS assessment from 1.6 to 5 or better, for better quality of life. Goal status: 05/12/24: Goal met and will be upgraded to 8 now  2.  Pt will improve A/ROM in left shoulder flexion from 47 degrees to at least 90 degrees, to have functional motion for tasks like reach and grasp.  Goal status: 05/12/24: Improved to 59 degrees now  3.  Pt will improve strength in left bicep flexion from 3/5 MMT to at least 4/5 MMT to have increased functional ability to carry out selfcare and higher-level homecare tasks with less difficulty. Goal status: 05/12/24: Improved to 3+/5 MMT now  4.  Pt will improve coordination skills in left hand and arm, as seen by within functional limit score on box and blocks testing to have increased functional ability to carry out fine motor tasks (fasteners, etc.) and more complex, coordinated IADLs (meal prep, sports, etc.).  Goal status: 05/12/24: Goal met, will be upgraded to 55 blocks and 1 minute to be closer to average    ASSESSMENT:  CLINICAL IMPRESSION: 05/12/24: He has met 2 out of 4 of his long-term goals now, and both of these goals will be upgraded.  He has improved his ability for the remaining 2 goals as well, but progress is slow.  He states getting a benefit from outpatient occupational therapy and desiring to continue on to work towards all goals.  We will continue on the for 6 more weeks of therapy as needed.     PLAN:  OT FREQUENCY: 1-2x/week  OT DURATION:: Up to 6 additional weeks from 05/08/2024 through 06/26/2024 and up to 15 total visits as needed  PLANNED INTERVENTIONS: 97535 self care/ADL training, 02889 therapeutic exercise, 97530 therapeutic activity, 97112  neuromuscular re-education, 97140 manual therapy, 97032 electrical stimulation (manual), 97760 Orthotic Initial, 97763 Orthotic/Prosthetic subsequent, coping strategies training, patient/family education, and DME and/or AE instructions  RECOMMENDED OTHER SERVICES: None now  CONSULTED AND AGREED WITH PLAN OF CARE: Patient  PLAN FOR NEXT SESSION:   Check on new sleeper stretch and shoulder abduction strengthening, consider focusing on isometric strengthening against the wall for rotator cuff, continue working bicep and supination together.    Melvenia Ada, OTR/L, CHT 05/12/2024, 2:55 PM    Referring diagnosis? R29.898 (ICD-10-CM) - Left arm weakness  Treatment diagnosis? (if different than referring diagnosis) M62.81, R27.8, M25.612 What was this (referring dx) caused by? [x]  Surgery []  Fall [x]  Ongoing issue []  Arthritis []  Other: ____________   Laterality: []  Rt [x]  Lt []  Both   Check all possible CPT codes:                   *CHOOSE 10 OR LESS*                     97535 self care/ADL training, 02889 therapeutic exercise, 97530 therapeutic activity, 97112 neuromuscular re-education, 97140 manual therapy, [id number] electrical stimulation (manual), [id number] Orthotic Initial, [id number] Orthotic/Prosthetic subsequent, coping strategies training, patient/family education, and DME and/or AE instructions

## 2024-05-12 ENCOUNTER — Encounter: Payer: Self-pay | Admitting: Rehabilitative and Restorative Service Providers"

## 2024-05-12 ENCOUNTER — Ambulatory Visit: Admitting: Rehabilitative and Restorative Service Providers"

## 2024-05-12 DIAGNOSIS — M25612 Stiffness of left shoulder, not elsewhere classified: Secondary | ICD-10-CM

## 2024-05-12 DIAGNOSIS — R278 Other lack of coordination: Secondary | ICD-10-CM

## 2024-05-12 DIAGNOSIS — M6281 Muscle weakness (generalized): Secondary | ICD-10-CM

## 2024-05-18 NOTE — Therapy (Signed)
 OUTPATIENT OCCUPATIONAL THERAPY TREATMENT NOTE  Patient Name: Dustin [name] MRN: [id number] DOB:[birth year], 73 y.o., male Today's Date: 05/19/2024  PCP: Xenia Devonshire, MD REFERRING PROVIDER: Georgina Ozell LABOR, MD   END OF SESSION:  OT End of Session - 05/19/24 1351     Visit Number 10    Number of Visits 15    Date for OT Re-Evaluation 06/26/24    Authorization Type [employer]    OT Start Time 1350    OT Stop Time 1428    OT Time Calculation (min) 38 min    Activity Tolerance Patient tolerated treatment well;No increased pain;Patient limited by fatigue    Behavior During Therapy [facility] for tasks assessed/performed               Past Medical History:  Diagnosis Date   Aortic regurgitation    moderate to severe AR 01/22/24   Arthritis    CKD (chronic kidney disease)    Coronary artery disease    GERD (gastroesophageal reflux disease) 07/01/2018   Heart murmur    stage 4, 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   Hypertension    Hypothyroidism 07/01/2018   Paroxysmal A-fib (HCC)    S/P mitral valve repair 07/08/2017   Spinal stenosis of lumbar region 08/22/2021   Past Surgical History:  Procedure Laterality Date   ANTERIOR CERVICAL DECOMP/DISCECTOMY FUSION N/A 03/10/2024   Procedure: ANTERIOR CERVICAL DECOMPRESSION/DISCECTOMY FUSION 2 LEVELS;  Surgeon: Georgina Ozell LABOR, MD;  Location: [facility] OR;  Service: Orthopedics;  Laterality: N/A;  C3-5 ANTERIOR CERVICAL DISCECTOMY AND FUSION   CARDIAC CATHETERIZATION  05/14/2017   hx ablation for A-flutter  11/01/2017   MITRAL VALVE REPAIR (MV)/CORONARY ARTERY BYPASS GRAFTING (CABG)  07/08/2017   SVG-OM, LAA closure, MV reconstruction with annuloplasty and sliding plasty via superior septal approach/Medtronic Simplici-T Annuloplasty System, left GSV harvest 07/08/17 by Rosalynn Dines, [facility] at [facility]   TONSILLECTOMY     removed as a child   Patient Active Problem List   Diagnosis Date Noted   Cervical radiculopathy 03/10/2024   S/P lumbar  fusion 12/01/2021   Lumbar stenosis 11/29/2021   S/P mitral valve repair 07/01/2018   OSA (obstructive sleep apnea) 07/01/2018   GERD (gastroesophageal reflux disease) 07/01/2018   Hyperlipidemia 07/01/2018   Erectile dysfunction 07/01/2018   Hypothyroid 07/01/2018    ONSET DATE: DOS 03/10/24  REFERRING DIAG: M70.101 (ICD-10-CM) - Left arm weakness   THERAPY DIAG:  Muscle weakness (generalized)  Stiffness of left shoulder, not elsewhere classified  Other lack of coordination  Other low back pain  Rationale for Evaluation and Treatment: Rehabilitation  PERTINENT HISTORY: hx of other spinal fusion, past PT for LE weakness and decreased functional endurance in 2024.  Lt arm has been weak for approx 6 months or more  He states his Lt arm has been getting weaker over 1.5 years and finally had need for cervical fusion to prevent nerve compression. He states mainly bicep and deltoid weakness. He states having good FMS, but still needs to help with right arm to do buttons due to bicep weakness.  He has started to see some improvements in function after surgery.  PRECAUTIONS: Cervical and Other: No BLT >10#  RED FLAGS: None   WEIGHT BEARING RESTRICTIONS: Yes 10#   SUBJECTIVE:   SUBJECTIVE STATEMENT: Now 10 weeks s/p ANTERIOR CERVICAL DECOMPRESSION/DISCECTOMY FUSION 2 LEVELS (N/A) - C3-5 ANTERIOR CERVICAL DISCECTOMY AND FUSION   He states doing well, noticing now using Lt hand to wipe down shower and  include in new, daily activities.      PAIN:  Are you having pain?     No, fatigue is main issue    PATIENT GOALS: To improve use of left hand and arm for daily function   NEXT MD VISIT: 04/23/24   OBJECTIVE: (All objective assessments below are from initial evaluation on: 03/31/24 unless otherwise specified.)    HAND DOMINANCE: Ambidextrous  ADLs: Overall ADLs: States some improved ability to perform grooming, self-care, home care, eat and drink using his left upper  extremity, but still having some difficulties.    FUNCTIONAL OUTCOME MEASURES: 05/12/24: PSFS 5 today   Eval: Patient Specific Functional Scale: 1.6 (washing hair, drinking out of a glass with left hand, picking up dog)  (Higher Score  =  Better Ability for the Selected Tasks)      UPPER EXTREMITY ROM     Shoulder to Wrist AROM Right eval Left eval LT 04/09/24 Lt 04/16/24 Lt 04/21/24 Lt 05/12/24  Shoulder flexion 154* 47 (with hiking)  51 54 54 59  Shoulder abduction 162* 72 (a lot of compensation) (140* PROM) 58 63  64  Shoulder extension 78 44  45 49 45  Shoulder internal rotation 52 44  48  34  Shoulder external rotation 77 75  80  80 WNL  Elbow flexion 155 144 151   146 WNL   Elbow extension (-5) (-5) (-5)    (-10)  (Rt side is (-15))   WNL   Forearm supination  76 78   70  Forearm pronation   85    85  Wrist flexion  36 39 54 45 45  Wrist extension  76 84 82 81 85  (Blank rows = not tested)   Hand AROM Left eval  Full Fist Ability (or Gap to Distal Palmar Crease) full  Thumb Opposition  (Kapandji Scale)  8/10  (Blank rows = not tested)   UPPER EXTREMITY MMT:     MMT Right 03/31/24 Left 03/31/24 Lt 05/12/24  Shoulder flexion 5/5 3-/5 4-/5  Shoulder abduction 5/5 3-/5 3-/5  Shoulder adduction     Shoulder extension  3-/5 4+/5  Shoulder internal rotation  4+/5 4+/5  Shoulder external rotation  4-/5 4-/5  Middle trapezius  3-/5   Lower trapezius  3-/5   Elbow flexion 5/5 3/5 3+/5  Elbow extension 5/5 4+/5 5/5  Forearm supination  3/5 3/5  Forearm pronation  5/5 5/5  Wrist flexion  4+/5 5/5 (in neutral)   Wrist extension  5/5 5/5  Wrist ulnar deviation     Wrist radial deviation     (Blank rows = not tested)  HAND FUNCTION: 05/12/24: Grip Lt: 65.2#   Eval: Slight weakness in affected Lt hand.  Grip strength Right: 81 lbs, Left: 69 lbs   COORDINATION: 05/12/24: BBT Lt: 46 blocks,  (45 is WFL, 65 is avg)  Eval: Observed coordination impairments with  affected Lt hand. Box and Blocks Test: Lt 42 Blocks today (45 is WFL, 65 is avg)   OBSERVATIONS:   Eval: No significant pain, has a lot of compensatory shoulder hiking and scapular weakness with shoulder flexion and abduction.  A pattern of weakness in shoulder flexion, abduction that travels through the bicep, supinator.   TODAY'S TREATMENT:  05/19/24: To help with sh/scap instability, OT introduces new ways for dynamic stabilization as listed below today.   This helps keep things fresh and present new ideas to motivate the patient.    Isometrics for scap/shoulder  stability as below:  Extension x15 (5 sec squeezes)  Flexion  x15 (5 sec squeezes)  Sh abduction x15 (5 sec squeezes)  Sh adduction x15 (5 sec squeezes)  Sh external rotation x15 (5 sec squeezes)  Sh internal rotation x15 (5 sec squeezes)   He has some fatigue with these but no pain.   Also works on eccentric supination and wrist flexion with biecps isometrics concurrently     PATIENT EDUCATION: Education details: See tx section above for details  Person educated: Patient Education method: Engineer, structural, Teach back, Handouts  Education comprehension: States and demonstrates understanding, Additional Education required    HOME EXERCISE PROGRAM: Access Code: [email or web address] URL: [email or web address] Date: 03/31/2024 Prepared by: Melvenia Ada   GOALS: Goals reviewed with patient? Yes   SHORT TERM GOALS: (STG required if POC>30 days) Target Date: 04/17/2024  Pt will demo/state understanding of initial HEP to improve pain levels and prerequisite motion. Goal status: 04/16/2024: Met   LONG TERM GOALS: Target Date: 05/15/2024  Pt will improve functional ability by decreased impairment per PSFS assessment from 1.6 to 5 or better, for better quality of life. Goal status: 05/12/24: Goal met and will be upgraded to 8 now  2.  Pt will improve A/ROM in left shoulder flexion from 47 degrees to at least  90 degrees, to have functional motion for tasks like reach and grasp.  Goal status: 05/12/24: Improved to 59 degrees now  3.  Pt will improve strength in left bicep flexion from 3/5 MMT to at least 4/5 MMT to have increased functional ability to carry out selfcare and higher-level homecare tasks with less difficulty. Goal status: 05/12/24: Improved to 3+/5 MMT now  4.  Pt will improve coordination skills in left hand and arm, as seen by within functional limit score on box and blocks testing to have increased functional ability to carry out fine motor tasks (fasteners, etc.) and more complex, coordinated IADLs (meal prep, sports, etc.).  Goal status: 05/12/24: Goal met, will be upgraded to 55 blocks and 1 minute to be closer to average    ASSESSMENT:  CLINICAL IMPRESSION: 05/19/24: Continue to present ways to stabilize the shoulder and promote functional strength of biceps/FA and fnl activity   05/12/24: He has met 2 out of 4 of his long-term goals now, and both of these goals will be upgraded.  He has improved his ability for the remaining 2 goals as well, but progress is slow.  He states getting a benefit from outpatient occupational therapy and desiring to continue on to work towards all goals.  We will continue on the for 6 more weeks of therapy as needed.     PLAN:  OT FREQUENCY: 1-2x/week  OT DURATION:: Up to 6 additional weeks from 05/08/2024 through 06/26/2024 and up to 15 total visits as needed  PLANNED INTERVENTIONS: 97535 self care/ADL training, 02889 therapeutic exercise, 97530 therapeutic activity, 97112 neuromuscular re-education, 97140 manual therapy, 97032 electrical stimulation (manual), 97760 Orthotic Initial, 97763 Orthotic/Prosthetic subsequent, coping strategies training, patient/family education, and DME and/or AE instructions  RECOMMENDED OTHER SERVICES: None now  CONSULTED AND AGREED WITH PLAN OF CARE: Patient  PLAN FOR NEXT SESSION:   Rev isos as needed, work more on  biceps/FA as able.     Melvenia Ada, OTR/L, CHT 05/19/2024, 2:15 PM

## 2024-05-19 ENCOUNTER — Encounter: Payer: Self-pay | Admitting: Rehabilitative and Restorative Service Providers"

## 2024-05-19 ENCOUNTER — Ambulatory Visit: Admitting: Rehabilitative and Restorative Service Providers"

## 2024-05-19 DIAGNOSIS — R278 Other lack of coordination: Secondary | ICD-10-CM | POA: Diagnosis not present

## 2024-05-19 DIAGNOSIS — M5459 Other low back pain: Secondary | ICD-10-CM

## 2024-05-19 DIAGNOSIS — M6281 Muscle weakness (generalized): Secondary | ICD-10-CM

## 2024-05-19 DIAGNOSIS — M25612 Stiffness of left shoulder, not elsewhere classified: Secondary | ICD-10-CM

## 2024-05-25 NOTE — Therapy (Signed)
 OUTPATIENT OCCUPATIONAL THERAPY TREATMENT NOTE  Patient Name: Dustin [name] MRN: [id number] DOB:23-Nov-1950, 73 y.o., male Today's Date: 05/26/2024  PCP: Xenia Devonshire, MD REFERRING PROVIDER: Georgina Ozell LABOR, MD   END OF SESSION:  OT End of Session - 05/26/24 1434     Visit Number 11    Number of Visits 15    Date for OT Re-Evaluation 06/26/24    Authorization Type [employer]    OT Start Time 1434    OT Stop Time 1513    OT Time Calculation (min) 39 min    Activity Tolerance Patient tolerated treatment well;No increased pain;Patient limited by fatigue    Behavior During Therapy [facility] 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   Hypertension    Hypothyroidism 07/01/2018   Paroxysmal A-fib (HCC)    S/P mitral valve repair 07/08/2017   Spinal stenosis of lumbar region 08/22/2021   Past Surgical History:  Procedure Laterality Date   ANTERIOR CERVICAL DECOMP/DISCECTOMY FUSION N/A 03/10/2024   Procedure: ANTERIOR CERVICAL DECOMPRESSION/DISCECTOMY FUSION 2 LEVELS;  Surgeon: Georgina Ozell LABOR, MD;  Location: [facility] OR;  Service: Orthopedics;  Laterality: N/A;  C3-5 ANTERIOR CERVICAL DISCECTOMY AND FUSION   CARDIAC CATHETERIZATION  05/14/2017   hx ablation for A-flutter  11/01/2017   MITRAL VALVE REPAIR (MV)/CORONARY ARTERY BYPASS GRAFTING (CABG)  07/08/2017   SVG-OM, LAA closure, MV reconstruction with annuloplasty and sliding plasty via superior septal approach/Medtronic Simplici-T Annuloplasty System, left GSV harvest 07/08/17 by Rosalynn Dines, MD at [facility]   TONSILLECTOMY     removed as a child   Patient Active Problem List   Diagnosis Date Noted   Cervical radiculopathy 03/10/2024   S/P  lumbar fusion 12/01/2021   Lumbar stenosis 11/29/2021   S/P mitral valve repair 07/01/2018   OSA (obstructive sleep apnea) 07/01/2018   GERD (gastroesophageal reflux disease) 07/01/2018   Hyperlipidemia 07/01/2018   Erectile dysfunction 07/01/2018   Hypothyroid 07/01/2018    ONSET DATE: DOS 03/10/24  REFERRING DIAG: M70.101 (ICD-10-CM) - Left arm weakness   THERAPY DIAG:  Muscle weakness (generalized)  Stiffness of left shoulder, not elsewhere classified  Other lack of coordination  Other low back pain  Rationale for Evaluation and Treatment: Rehabilitation  PERTINENT HISTORY: hx of other spinal fusion, past PT for LE weakness and decreased functional endurance in 2024.  Lt arm has been weak for approx 6 months or more  He states his Lt arm has been getting weaker over 1.5 years and finally had need for cervical fusion to prevent nerve compression. He states mainly bicep and deltoid weakness. He states having good FMS, but still needs to help with right arm to do buttons due to bicep weakness.  He has started to see some improvements in function after surgery.  PRECAUTIONS: Cervical and Other: No BLT >10#  RED FLAGS: None   WEIGHT BEARING RESTRICTIONS: WBAT now    SUBJECTIVE:   SUBJECTIVE STATEMENT: Now 11 weeks s/p ANTERIOR CERVICAL DECOMPRESSION/DISCECTOMY FUSION 2 LEVELS (N/A) - C3-5 ANTERIOR CERVICAL DISCECTOMY AND FUSION   He states a few days of Lt sided neck pain, ibprophen helped  but he'd like some manual therapy.       PAIN:  Are you having pain?     No, fatigue is main issue    PATIENT GOALS: To improve use of left hand and arm for daily function   NEXT MD VISIT: 04/23/24   OBJECTIVE: (All objective assessments below are from initial evaluation on: 03/31/24 unless otherwise specified.)    HAND DOMINANCE: Ambidextrous  ADLs: Overall ADLs: States some improved ability to perform grooming, self-care, home care, eat and drink using his left upper  extremity, but still having some difficulties.    FUNCTIONAL OUTCOME MEASURES: 05/12/24: PSFS 5 today   Eval: Patient Specific Functional Scale: 1.6 (washing hair, drinking out of a glass with left hand, picking up dog)  (Higher Score  =  Better Ability for the Selected Tasks)      UPPER EXTREMITY ROM     Shoulder to Wrist AROM Right eval Left eval LT 04/09/24 Lt 04/16/24 Lt 04/21/24 Lt 05/12/24 Lt TBD  Shoulder flexion 154* 47 (with hiking)  51 54 54 59   Shoulder abduction 162* 72 (a lot of compensation) (140* PROM) 58 63  64   Shoulder extension 78 44  45 49 45   Shoulder internal rotation 52 44  48  34   Shoulder external rotation 77 75  80  80 WNL   Elbow flexion 155 144 151   146 WNL    Elbow extension (-5) (-5) (-5)    (-10)  (Rt side is (-15))   WNL    Forearm supination  76 78   70   Forearm pronation   85    85   Wrist flexion  36 39 54 45 45   Wrist extension  76 84 82 81 85   (Blank rows = not tested)   Hand AROM Left eval  Full Fist Ability (or Gap to Distal Palmar Crease) full  Thumb Opposition  (Kapandji Scale)  8/10  (Blank rows = not tested)   UPPER EXTREMITY MMT:     MMT Right 03/31/24 Left 03/31/24 Lt 05/12/24  Shoulder flexion 5/5 3-/5 4-/5  Shoulder abduction 5/5 3-/5 3-/5  Shoulder adduction     Shoulder extension  3-/5 4+/5  Shoulder internal rotation  4+/5 4+/5  Shoulder external rotation  4-/5 4-/5  Middle trapezius  3-/5   Lower trapezius  3-/5   Elbow flexion 5/5 3/5 3+/5  Elbow extension 5/5 4+/5 5/5  Forearm supination  3/5 3/5  Forearm pronation  5/5 5/5  Wrist flexion  4+/5 5/5 (in neutral)   Wrist extension  5/5 5/5  Wrist ulnar deviation     Wrist radial deviation     (Blank rows = not tested)  HAND FUNCTION: 05/12/24: Grip Lt: 65.2#   Eval: Slight weakness in affected Lt hand.  Grip strength Right: 81 lbs, Left: 69 lbs   COORDINATION: 05/12/24: BBT Lt: 46 blocks,  (45 is WFL, 65 is avg)  Eval: Observed coordination  impairments with affected Lt hand. Box and Blocks Test: Lt 42 Blocks today (45 is WFL, 65 is avg)   OBSERVATIONS:   Eval: No significant pain, has a lot of compensatory shoulder hiking and scapular weakness with shoulder flexion and abduction.  A pattern of weakness in shoulder flexion, abduction that travels through the bicep, supinator.   TODAY'S TREATMENT:  05/26/24:  OT starts with IASTM to Lt neck which he found helpful to relieve some pain/tightness. After, he goes through the following  strengthening exercises and activities to build dynaic sh and functional strength.  He fatigues quickly, but recovers quickly after rest breaks.  He is encouraged to do the upgrades that we discussed and performed together in therapy at home as well.  Neck/upper trap stretches Supine 45* sh flexion and eccentrics x10 Standing overhead push with eccentrics for sh flexion x5  Biceps 2# x10  FA sup with 1# dowel  50# gripper x 15 blocks activity  55# seated rows  Standing overhead push with eccentrics for sh flexion x5     05/19/24: To help with sh/scap instability, OT introduces new ways for dynamic stabilization as listed below today.   This helps keep things fresh and present new ideas to motivate the patient.    Isometrics for scap/shoulder stability as below:  Extension x15 (5 sec squeezes)  Flexion  x15 (5 sec squeezes)  Sh abduction x15 (5 sec squeezes)  Sh adduction x15 (5 sec squeezes)  Sh external rotation x15 (5 sec squeezes)  Sh internal rotation x15 (5 sec squeezes)   He has some fatigue with these but no pain.   Also works on eccentric supination and wrist flexion with biecps isometrics concurrently     PATIENT EDUCATION: Education details: See tx section above for details  Person educated: Patient Education method: Engineer, structural, Teach back, Handouts  Education comprehension: States and demonstrates understanding, Additional Education required    HOME EXERCISE  PROGRAM: Access Code: [email or web address] URL: [email or web address] Date: 03/31/2024 Prepared by: Melvenia Ada   GOALS: Goals reviewed with patient? Yes   SHORT TERM GOALS: (STG required if POC>30 days) Target Date: 04/17/2024  Pt will demo/state understanding of initial HEP to improve pain levels and prerequisite motion. Goal status: 04/16/2024: Met   LONG TERM GOALS: Target Date: 06/26/2024  Pt will improve functional ability by decreased impairment per PSFS assessment from 1.6 to 5 or better, for better quality of life. Goal status: 05/12/24: Goal met and will be upgraded to 8 now  2.  Pt will improve A/ROM in left shoulder flexion from 47 degrees to at least 90 degrees, to have functional motion for tasks like reach and grasp.  Goal status: 05/12/24: Improved to 59 degrees now  3.  Pt will improve strength in left bicep flexion from 3/5 MMT to at least 4/5 MMT to have increased functional ability to carry out selfcare and higher-level homecare tasks with less difficulty. Goal status: 05/12/24: Improved to 3+/5 MMT now  4.  Pt will improve coordination skills in left hand and arm, as seen by within functional limit score on box and blocks testing to have increased functional ability to carry out fine motor tasks (fasteners, etc.) and more complex, coordinated IADLs (meal prep, sports, etc.).  Goal status: 05/12/24: Goal met, will be upgraded to 55 blocks and 1 minute to be closer to average    ASSESSMENT:  CLINICAL IMPRESSION: 05/26/24: In therapy he continues to improve on what he could do the prior week.  Being able to stand and do active assistive overhead reaching is a big upgrade over doing it at a 45 degree angle.  He is encouraged to add these in at home.  05/19/24: Continue to present ways to stabilize the shoulder and promote functional strength of biceps/FA and fnl activity   05/12/24: He has met 2 out of 4 of his long-term goals now, and both of these goals will be  upgraded.  He has improved his ability for the remaining 2 goals as well,  but progress is slow.  He states getting a benefit from outpatient occupational therapy and desiring to continue on to work towards all goals.  We will continue on the for 6 more weeks of therapy as needed.     PLAN:  OT FREQUENCY: 1-2x/week  OT DURATION:: Up to 6 additional weeks from 05/08/2024 through 06/26/2024 and up to 15 total visits as needed  PLANNED INTERVENTIONS: 97535 self care/ADL training, 02889 therapeutic exercise, 97530 therapeutic activity, 97112 neuromuscular re-education, 97140 manual therapy, 97032 electrical stimulation (manual), 97760 Orthotic Initial, 97763 Orthotic/Prosthetic subsequent, coping strategies training, patient/family education, and DME and/or AE instructions  RECOMMENDED OTHER SERVICES: None now  CONSULTED AND AGREED WITH PLAN OF CARE: Patient  PLAN FOR NEXT SESSION:   Continue to progress strength and dynamic functional ability   [employer], OTR/L, CHT 05/26/2024, 3:14 PM

## 2024-05-26 ENCOUNTER — Ambulatory Visit: Admitting: Rehabilitative and Restorative Service Providers"

## 2024-05-26 ENCOUNTER — Encounter: Payer: Self-pay | Admitting: Rehabilitative and Restorative Service Providers"

## 2024-05-26 DIAGNOSIS — M25612 Stiffness of left shoulder, not elsewhere classified: Secondary | ICD-10-CM

## 2024-05-26 DIAGNOSIS — M5459 Other low back pain: Secondary | ICD-10-CM | POA: Diagnosis not present

## 2024-05-26 DIAGNOSIS — M6281 Muscle weakness (generalized): Secondary | ICD-10-CM | POA: Diagnosis not present

## 2024-05-26 DIAGNOSIS — R278 Other lack of coordination: Secondary | ICD-10-CM | POA: Diagnosis not present

## 2024-06-02 ENCOUNTER — Ambulatory Visit: Admitting: Rehabilitative and Restorative Service Providers"

## 2024-06-02 ENCOUNTER — Encounter: Payer: Self-pay | Admitting: Rehabilitative and Restorative Service Providers"

## 2024-06-02 DIAGNOSIS — R278 Other lack of coordination: Secondary | ICD-10-CM

## 2024-06-02 DIAGNOSIS — M6281 Muscle weakness (generalized): Secondary | ICD-10-CM | POA: Diagnosis not present

## 2024-06-02 DIAGNOSIS — M25612 Stiffness of left shoulder, not elsewhere classified: Secondary | ICD-10-CM | POA: Diagnosis not present

## 2024-06-02 NOTE — Therapy (Signed)
 OUTPATIENT OCCUPATIONAL THERAPY TREATMENT NOTE  Patient Name: Dustin [name] MRN: [id number] DOB:11/18/1951, 73 y.o., male Today's Date: 06/02/2024  PCP: Xenia Devonshire, MD REFERRING PROVIDER: Georgina Ozell LABOR, MD   END OF SESSION:  OT End of Session - 06/02/24 1351     Visit Number 12    Number of Visits 15    Date for OT Re-Evaluation 06/26/24    Authorization Type [employer]    OT Start Time 1351    OT Stop Time 1434    OT Time Calculation (min) 43 min    Activity Tolerance Patient tolerated treatment well;No increased pain;Patient limited by fatigue    Behavior During Therapy [facility] for tasks assessed/performed                 Past Medical History:  Diagnosis Date   Aortic regurgitation    moderate to severe AR 01/22/24   Arthritis    CKD (chronic kidney disease)    Coronary artery disease    GERD (gastroesophageal reflux disease) 07/01/2018   Heart murmur    stage 4, 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   Hypertension    Hypothyroidism 07/01/2018   Paroxysmal A-fib (HCC)    S/P mitral valve repair 07/08/2017   Spinal stenosis of lumbar region 08/22/2021   Past Surgical History:  Procedure Laterality Date   ANTERIOR CERVICAL DECOMP/DISCECTOMY FUSION N/A 03/10/2024   Procedure: ANTERIOR CERVICAL DECOMPRESSION/DISCECTOMY FUSION 2 LEVELS;  Surgeon: Georgina Ozell LABOR, MD;  Location: [facility] OR;  Service: Orthopedics;  Laterality: N/A;  C3-5 ANTERIOR CERVICAL DISCECTOMY AND FUSION   CARDIAC CATHETERIZATION  05/14/2017   hx ablation for A-flutter  11/01/2017   MITRAL VALVE REPAIR (MV)/CORONARY ARTERY BYPASS GRAFTING (CABG)  07/08/2017   SVG-OM, LAA closure, MV reconstruction with annuloplasty and sliding plasty via superior septal approach/Medtronic Simplici-T Annuloplasty System, left GSV harvest 07/08/17 by Rosalynn Dines, MD at [facility]   TONSILLECTOMY     removed as a child   Patient Active Problem List   Diagnosis Date Noted   Cervical radiculopathy 03/10/2024   S/P  lumbar fusion 12/01/2021   Lumbar stenosis 11/29/2021   S/P mitral valve repair 07/01/2018   OSA (obstructive sleep apnea) 07/01/2018   GERD (gastroesophageal reflux disease) 07/01/2018   Hyperlipidemia 07/01/2018   Erectile dysfunction 07/01/2018   Hypothyroid 07/01/2018    ONSET DATE: DOS 03/10/24  REFERRING DIAG: M70.101 (ICD-10-CM) - Left arm weakness   THERAPY DIAG:  Muscle weakness (generalized)  Stiffness of left shoulder, not elsewhere classified  Other lack of coordination  Rationale for Evaluation and Treatment: Rehabilitation  PERTINENT HISTORY: hx of other spinal fusion, past PT for LE weakness and decreased functional endurance in 2024.  Lt arm has been weak for approx 6 months or more  He states his Lt arm has been getting weaker over 1.5 years and finally had need for cervical fusion to prevent nerve compression. He states mainly bicep and deltoid weakness. He states having good FMS, but still needs to help with right arm to do buttons due to bicep weakness.  He has started to see some improvements in function after surgery.  PRECAUTIONS: Cervical and Other: No BLT >10#  RED FLAGS: None   WEIGHT BEARING RESTRICTIONS: WBAT now    SUBJECTIVE:   SUBJECTIVE STATEMENT: Now 12 weeks s/p ANTERIOR CERVICAL DECOMPRESSION/DISCECTOMY FUSION 2 LEVELS (N/A) - C3-5 ANTERIOR CERVICAL DISCECTOMY AND FUSION   He states not having any pain, neck feels better after manual therapy in the last session.  PAIN:  Are you having pain?     No, fatigue is main issue    PATIENT GOALS: To improve use of left hand and arm for daily function   NEXT MD VISIT: 04/23/24   OBJECTIVE: (All objective assessments below are from initial evaluation on: 03/31/24 unless otherwise specified.)    HAND DOMINANCE: Ambidextrous  ADLs: Overall ADLs: States some improved ability to perform grooming, self-care, home care, eat and drink using his left upper extremity, but still having some  difficulties.    FUNCTIONAL OUTCOME MEASURES: 05/12/24: PSFS 5 today   Eval: Patient Specific Functional Scale: 1.6 (washing hair, drinking out of a glass with left hand, picking up dog)  (Higher Score  =  Better Ability for the Selected Tasks)      UPPER EXTREMITY ROM     Shoulder to Wrist AROM Right eval Left eval LT 04/09/24 Lt 04/16/24 Lt 04/21/24 Lt 05/12/24 Lt 06/02/24  Shoulder flexion 154* 47 (with hiking)  51 54 54 59 74  Shoulder abduction 162* 72 (a lot of compensation) (140* PROM) 58 63  64 82  Shoulder extension 78 44  45 49 45 51  Shoulder internal rotation 52 44  48  34   Shoulder external rotation 77 75  80  80 WNL   Elbow flexion 155 144 151   146 WNL    Elbow extension (-5) (-5) (-5)    (-10)  (Rt side is (-15))   WNL    Forearm supination  76 78   70 79  Forearm pronation   85    85   Wrist flexion  36 39 54 45 45 68  Wrist extension  76 84 82 81 85 80  (Blank rows = not tested)   Hand AROM Left eval  Full Fist Ability (or Gap to Distal Palmar Crease) full  Thumb Opposition  (Kapandji Scale)  8/10  (Blank rows = not tested)   UPPER EXTREMITY MMT:     MMT Right 03/31/24 Left 03/31/24 Lt 05/12/24  Shoulder flexion 5/5 3-/5 4-/5  Shoulder abduction 5/5 3-/5 3-/5  Shoulder adduction     Shoulder extension  3-/5 4+/5  Shoulder internal rotation  4+/5 4+/5  Shoulder external rotation  4-/5 4-/5  Middle trapezius  3-/5   Lower trapezius  3-/5   Elbow flexion 5/5 3/5 3+/5  Elbow extension 5/5 4+/5 5/5  Forearm supination  3/5 3/5  Forearm pronation  5/5 5/5  Wrist flexion  4+/5 5/5 (in neutral)   Wrist extension  5/5 5/5  Wrist ulnar deviation     Wrist radial deviation     (Blank rows = not tested)  HAND FUNCTION: 05/12/24: Grip Lt: 65.2#   Eval: Slight weakness in affected Lt hand.  Grip strength Right: 81 lbs, Left: 69 lbs   COORDINATION: 05/12/24: BBT Lt: 46 blocks,  (45 is WFL, 65 is avg)  Eval: Observed coordination impairments with  affected Lt hand. Box and Blocks Test: Lt 42 Blocks today (45 is WFL, 65 is avg)   OBSERVATIONS:   Eval: No significant pain, has a lot of compensatory shoulder hiking and scapular weakness with shoulder flexion and abduction.  A pattern of weakness in shoulder flexion, abduction that travels through the bicep, supinator.   TODAY'S TREATMENT:  06/02/24: He starts with active range of motion for exercise as well as new measures which shows continued an excellent improvement at all joints of the weak left arm.  Next, OT emphasizes the Asiyah  of eccentric strengthening today to help push past plateaus of progress her strength if and when they occur.  He has lead through eccentric strengthening at shoulder flexion as well as new ideas for shoulder abduction, bicep work as well as forearm work.  We discussed how these are similar to his other exercises that we have performed together, but eccentrically is more of an activity that allows him to tolerate slightly more weight, though he should remain in a good posture while performing this.  We used a Ship broker for feedback for postures throughout, as well as therapist cueing.  He fatigues rather quickly but is showing overall better endurance and motion today.  We also reviewed some stretching that can be done at the shoulder and wrist as needed to maintain passive range of motion until active range of motion is full and robust.   Has no pain or questions at the end of the session  UBE 3 mins @ 4.2 resistance x45 RPM Standing sh flexion AAROM  1# dowel x 15  Standing shoulder abduction place and holds with wand (AAROM)  1# dowel x15  Standing bicep eccentric strengthening with concurrent supination 4# x15 Standing forearm eccentric supination 2#dowel 10 Standing wrist flexion 3 pound weight x 10 Bilateral 3 kg ball curls x10, x2 eccentric to finish   PATIENT EDUCATION: Education details: See tx section above for details  Person educated:  Patient Education method: Engineer, structural, Teach back, Handouts  Education comprehension: States and demonstrates understanding, Additional Education required    HOME EXERCISE PROGRAM: Access Code: [email or web address] URL: [email or web address] Date: 03/31/2024 Prepared by: Melvenia Ada   GOALS: Goals reviewed with patient? Yes   SHORT TERM GOALS: (STG required if POC>30 days) Target Date: 04/17/2024  Pt will demo/state understanding of initial HEP to improve pain levels and prerequisite motion. Goal status: 04/16/2024: Met   LONG TERM GOALS: Target Date: 06/26/2024  Pt will improve functional ability by decreased impairment per PSFS assessment from 1.6 to 5 or better, for better quality of life. Goal status: 05/12/24: Goal met and will be upgraded to 8 now  2.  Pt will improve A/ROM in left shoulder flexion from 47 degrees to at least 90 degrees, to have functional motion for tasks like reach and grasp.  Goal status: 05/12/24: Improved to 59 degrees now  3.  Pt will improve strength in left bicep flexion from 3/5 MMT to at least 4/5 MMT to have increased functional ability to carry out selfcare and higher-level homecare tasks with less difficulty. Goal status: 05/12/24: Improved to 3+/5 MMT now  4.  Pt will improve coordination skills in left hand and arm, as seen by within functional limit score on box and blocks testing to have increased functional ability to carry out fine motor tasks (fasteners, etc.) and more complex, coordinated IADLs (meal prep, sports, etc.).  Goal status: 05/12/24: Goal met, will be upgraded to 55 blocks and 1 minute to be closer to average    ASSESSMENT:  CLINICAL IMPRESSION: 06/02/24: Continues to improve range of motion, strength, ability.  Carry on   05/26/24: In therapy he continues to improve on what he could do the prior week.  Being able to stand and do active assistive overhead reaching is a big upgrade over doing it at a 45 degree  angle.  He is encouraged to add these in at home.  05/19/24: Continue to present ways to stabilize the shoulder and promote functional strength of biceps/FA and fnl activity   05/12/24: He  has met 2 out of 4 of his long-term goals now, and both of these goals will be upgraded.  He has improved his ability for the remaining 2 goals as well, but progress is slow.  He states getting a benefit from outpatient occupational therapy and desiring to continue on to work towards all goals.  We will continue on the for 6 more weeks of therapy as needed.     PLAN:  OT FREQUENCY: 1-2x/week  OT DURATION:: Up to 6 additional weeks from 05/08/2024 through 06/26/2024 and up to 15 total visits as needed  PLANNED INTERVENTIONS: 97535 self care/ADL training, 02889 therapeutic exercise, 97530 therapeutic activity, 97112 neuromuscular re-education, 97140 manual therapy, 97032 electrical stimulation (manual), 97760 Orthotic Initial, 97763 Orthotic/Prosthetic subsequent, coping strategies training, patient/family education, and DME and/or AE instructions  RECOMMENDED OTHER SERVICES: None now  CONSULTED AND AGREED WITH PLAN OF CARE: Patient  PLAN FOR NEXT SESSION:   Continue to progress strength and ability, also start to focus on goal goals and any remaining questions or issues as we will likely this likely discharge therapy in the next 2-3 visits per plan of care  Melvenia Ada, OTR/L, CHT 06/02/2024, 2:45 PM

## 2024-06-04 ENCOUNTER — Ambulatory Visit: Admitting: Orthopedic Surgery

## 2024-06-04 ENCOUNTER — Other Ambulatory Visit (INDEPENDENT_AMBULATORY_CARE_PROVIDER_SITE_OTHER): Payer: Self-pay

## 2024-06-04 DIAGNOSIS — Z981 Arthrodesis status: Secondary | ICD-10-CM

## 2024-06-04 NOTE — Progress Notes (Signed)
 Orthopedic Surgery Post-operative Office Visit   Procedure: C3-5 ACDF Date of Surgery: 03/10/2024 (~3 months post-op)   Assessment: Patient is a 73 y.o. male who has noticed improvement in his left upper extremity weakness since our last visit     Plan: -No spine specific precautions -Continue to work on left upper extremity strength and endurance -Pain management: tylenol  as needed -Return to office in 3 months, x-rays needed at next visit: AP/lateral/flex/ex cervical   ___________________________________________________________________________     Subjective: Patient continued to work with OT since her last visit.  He has noticed improvement in his strength.  He is able to do more of his daily hygiene habits including shaving, washing his hair, and brushing his teeth with the left hand.  He still notices decreased stamina and that arm and sometimes has to switch to the right side.  He continues to work on a home exercise program to work on Print production planner and endurance.  He has not had any pain in the neck or radiating into the upper extremity.  He still feels that his balance and walking have improved since surgery.  He has not had any dysphagia recently.     Objective:   General: no acute distress, appropriate affect Neurologic: alert, answering questions appropriately, following commands Respiratory: unlabored breathing on room air Skin: incision is well healed   MSK (spine):   -Strength exam                                                   Left                  Right Grip strength                5/5                  5/5 Interosseus                  5/5                  5/5 Wrist extension            5/5                  5/5 Wrist flexion                 5/5                  5/5 Elbow flexion                4/5                 5/5 Deltoid                          3/5                  5/5   EHL                              4/5                  4/5 TA  5/5                  5/5 GSC                             5/5                  5/5 Knee extension            5/5                  5/5 Hip flexion                    5/5                  5/5   -Sensory exam                           Sensation intact to light touch in L2-S1 nerve distributions of bilateral lower extremities             Sensation intact to light touch in C4-T1 nerve distributions of bilateral upper extremities     Imaging: XRs of the cervical spine from 06/04/2024 were independently reviewed and interpreted, showing interbody devices at C3/4 and C4/5.  Interbody's appear in appropriate position.  There is anterior instrumentation from C3-C5.  No lucency is seen around the screws.  None of the screws are backed out.  Disc at loss at C5/6 and C6/7.  No translation seen on the flexion/extension views.  No fracture or dislocation seen.     Patient name: QUIN MATHENIA Patient MRN: [id number] Date of visit: 06/04/24

## 2024-06-05 NOTE — Therapy (Signed)
 OUTPATIENT OCCUPATIONAL THERAPY TREATMENT NOTE  Patient Name: Dustin [name] MRN: [id number] DOB:1951-06-26, 73 y.o., male Today's Date: 06/09/2024  PCP: Xenia Devonshire, MD REFERRING PROVIDER: Georgina Ozell LABOR, MD   END OF SESSION:  OT End of Session - 06/09/24 1349     Visit Number 13    Number of Visits 15    Date for OT Re-Evaluation 06/26/24    Authorization Type [employer]    OT Start Time 1349    OT Stop Time 1431    OT Time Calculation (min) 42 min    Activity Tolerance Patient tolerated treatment well;No increased pain;Patient limited by fatigue    Behavior During Therapy [facility] for tasks assessed/performed           Past Medical History:  Diagnosis Date   Aortic regurgitation    moderate to severe AR 01/22/24   Arthritis    CKD (chronic kidney disease)    Coronary artery disease    GERD (gastroesophageal reflux disease) 07/01/2018   Heart murmur    stage 4, 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   Hypertension    Hypothyroidism 07/01/2018   Paroxysmal A-fib (HCC)    S/P mitral valve repair 07/08/2017   Spinal stenosis of lumbar region 08/22/2021   Past Surgical History:  Procedure Laterality Date   ANTERIOR CERVICAL DECOMP/DISCECTOMY FUSION N/A 03/10/2024   Procedure: ANTERIOR CERVICAL DECOMPRESSION/DISCECTOMY FUSION 2 LEVELS;  Surgeon: Georgina Ozell LABOR, MD;  Location: [facility] OR;  Service: Orthopedics;  Laterality: N/A;  C3-5 ANTERIOR CERVICAL DISCECTOMY AND FUSION   CARDIAC CATHETERIZATION  05/14/2017   hx ablation for A-flutter  11/01/2017   MITRAL VALVE REPAIR (MV)/CORONARY ARTERY BYPASS GRAFTING (CABG)  07/08/2017   SVG-OM, LAA closure, MV reconstruction with annuloplasty and sliding plasty via superior septal approach/Medtronic Simplici-T Annuloplasty System, left GSV harvest 07/08/17 by Rosalynn Dines, MD at [facility]   TONSILLECTOMY     removed as a child   Patient Active Problem List   Diagnosis Date Noted   Cervical radiculopathy 03/10/2024   S/P lumbar fusion  12/01/2021   Lumbar stenosis 11/29/2021   S/P mitral valve repair 07/01/2018   OSA (obstructive sleep apnea) 07/01/2018   GERD (gastroesophageal reflux disease) 07/01/2018   Hyperlipidemia 07/01/2018   Erectile dysfunction 07/01/2018   Hypothyroid 07/01/2018    ONSET DATE: DOS 03/10/24  REFERRING DIAG: M70.101 (ICD-10-CM) - Left arm weakness   THERAPY DIAG:  Muscle weakness (generalized)  Other low back pain  Other lack of coordination  Stiffness of left shoulder, not elsewhere classified  Rationale for Evaluation and Treatment: Rehabilitation  PERTINENT HISTORY: hx of other spinal fusion, past PT for LE weakness and decreased functional endurance in 2024.  Lt arm has been weak for approx 6 months or more  He states his Lt arm has been getting weaker over 1.5 years and finally had need for cervical fusion to prevent nerve compression. He states mainly bicep and deltoid weakness. He states having good FMS, but still needs to help with right arm to do buttons due to bicep weakness.  He has started to see some improvements in function after surgery.  PRECAUTIONS: Cervical and Other: No BLT >10#  RED FLAGS: None   WEIGHT BEARING RESTRICTIONS: WBAT now    SUBJECTIVE:   SUBJECTIVE STATEMENT: Now 13 weeks s/p ANTERIOR CERVICAL DECOMPRESSION/DISCECTOMY FUSION 2 LEVELS (N/A) - C3-5 ANTERIOR CERVICAL DISCECTOMY AND FUSION   He states he was seeing the doctor recently who was impressed with his progress.  PAIN:  Are you having pain?     No, fatigue is main issue    PATIENT GOALS: To improve use of left hand and arm for daily function   NEXT MD VISIT: 04/23/24   OBJECTIVE: (All objective assessments below are from initial evaluation on: 03/31/24 unless otherwise specified.)    HAND DOMINANCE: Ambidextrous  ADLs: Overall ADLs: States some improved ability to perform grooming, self-care, home care, eat and drink using his left upper extremity, but still having some  difficulties.    FUNCTIONAL OUTCOME MEASURES: 05/12/24: PSFS 5 today   Eval: Patient Specific Functional Scale: 1.6 (washing hair, drinking out of a glass with left hand, picking up dog)  (Higher Score  =  Better Ability for the Selected Tasks)      UPPER EXTREMITY ROM     Shoulder to Wrist AROM Right eval Left eval LT 04/09/24 Lt 04/16/24 Lt 04/21/24 Lt 05/12/24 Lt 06/02/24  Shoulder flexion 154* 47 (with hiking)  51 54 54 59 74  Shoulder abduction 162* 72 (a lot of compensation) (140* PROM) 58 63  64 82  Shoulder extension 78 44  45 49 45 51  Shoulder internal rotation 52 44  48  34   Shoulder external rotation 77 75  80  80 WNL   Elbow flexion 155 144 151   146 WNL    Elbow extension (-5) (-5) (-5)    (-10)  (Rt side is (-15))   WNL    Forearm supination  76 78   70 79  Forearm pronation   85    85   Wrist flexion  36 39 54 45 45 68  Wrist extension  76 84 82 81 85 80  (Blank rows = not tested)   Hand AROM Left eval  Full Fist Ability (or Gap to Distal Palmar Crease) full  Thumb Opposition  (Kapandji Scale)  8/10  (Blank rows = not tested)   UPPER EXTREMITY MMT:     MMT Right 03/31/24 Left 03/31/24 Lt 05/12/24  Shoulder flexion 5/5 3-/5 4-/5  Shoulder abduction 5/5 3-/5 3-/5  Shoulder adduction     Shoulder extension  3-/5 4+/5  Shoulder internal rotation  4+/5 4+/5  Shoulder external rotation  4-/5 4-/5  Middle trapezius  3-/5   Lower trapezius  3-/5   Elbow flexion 5/5 3/5 3+/5  Elbow extension 5/5 4+/5 5/5  Forearm supination  3/5 3/5  Forearm pronation  5/5 5/5  Wrist flexion  4+/5 5/5 (in neutral)   Wrist extension  5/5 5/5  Wrist ulnar deviation     Wrist radial deviation     (Blank rows = not tested)  HAND FUNCTION: 05/12/24: Grip Lt: 65.2#   Eval: Slight weakness in affected Lt hand.  Grip strength Right: 81 lbs, Left: 69 lbs   COORDINATION: TBD: BBT Lt: TBD blocks today   05/12/24: BBT Lt: 46 blocks,  (45 is WFL, 65 is avg)  Eval: Observed  coordination impairments with affected Lt hand. Box and Blocks Test: Lt 42 Blocks today (45 is WFL, 65 is avg)   OBSERVATIONS:   Eval: No significant pain, has a lot of compensatory shoulder hiking and scapular weakness with shoulder flexion and abduction.  A pattern of weakness in shoulder flexion, abduction that travels through the bicep, supinator.   TODAY'S TREATMENT:  06/09/24: OT leads him through the dynamic activities listed below to continue strengthening and stabilizing the week left arm to help improve functional tasks.  He needed  some rest breaks, some cues to perform throughout, and again was presented with new ideas.  At the end of the session he states understanding all directions, understanding modifications and new activities today, fatigue but no pain.  He was told to prepare for upcoming discharge in 2 weeks by continuing to exercise, continuing to come in with any questions or concerns about activities.  UBE 6 mins @ 4.2 resistance x45 RPM  Seated row 55# x15  Lat pull down 25 x 15 I, T, Y [place] exercises x5 each with squeeze  Ball on wall AAROM sh flexion x15  Bar biceps 5# with wrist support x20  Bicep flexion with supination using 1# x15  Farmer's Carry 15# Lt arm x142ft    06/02/24: He starts with active range of motion for exercise as well as new measures which shows continued an excellent improvement at all joints of the weak left arm.  Next, OT emphasizes the Asiyah of eccentric strengthening today to help push past plateaus of progress her strength if and when they occur.  He has lead through eccentric strengthening at shoulder flexion as well as new ideas for shoulder abduction, bicep work as well as forearm work.  We discussed how these are similar to his other exercises that we have performed together, but eccentrically is more of an activity that allows him to tolerate slightly more weight, though he should remain in a good posture while performing this.  We  used a Ship broker for feedback for postures throughout, as well as therapist cueing.  He fatigues rather quickly but is showing overall better endurance and motion today.  We also reviewed some stretching that can be done at the shoulder and wrist as needed to maintain passive range of motion until active range of motion is full and robust.   Has no pain or questions at the end of the session  UBE 3 mins @ 4.2 resistance x45 RPM Standing sh flexion AAROM  1# dowel x 15  Standing shoulder abduction place and holds with wand (AAROM)  1# dowel x15  Standing bicep eccentric strengthening with concurrent supination 4# x15 Standing forearm eccentric supination 2#dowel 10 Standing wrist flexion 3 pound weight x 10 Bilateral 3 kg ball curls x10, x2 eccentric to finish   PATIENT EDUCATION: Education details: See tx section above for details  Person educated: Patient Education method: Engineer, structural, Teach back, Handouts  Education comprehension: States and demonstrates understanding, Additional Education required    HOME EXERCISE PROGRAM: Access Code: [email or web address] URL: [email or web address] Date: 03/31/2024 Prepared by: Melvenia Ada   GOALS: Goals reviewed with patient? Yes   SHORT TERM GOALS: (STG required if POC>30 days) Target Date: 04/17/2024  Pt will demo/state understanding of initial HEP to improve pain levels and prerequisite motion. Goal status: 04/16/2024: Met   LONG TERM GOALS: Target Date: 06/26/2024  Pt will improve functional ability by decreased impairment per PSFS assessment from 1.6 to 5 or better, for better quality of life. Goal status: 05/12/24: Goal met and will be upgraded to 8 now  2.  Pt will improve A/ROM in left shoulder flexion from 47 degrees to at least 90 degrees, to have functional motion for tasks like reach and grasp.  Goal status: 05/12/24: Improved to 59 degrees now  3.  Pt will improve strength in left bicep flexion from 3/5 MMT to at  least 4/5 MMT to have increased functional ability to carry out selfcare and higher-level homecare tasks with less difficulty. Goal status: 05/12/24: Improved  to 3+/5 MMT now  4.  Pt will improve coordination skills in left hand and arm, as seen by within functional limit score on box and blocks testing to have increased functional ability to carry out fine motor tasks (fasteners, etc.) and more complex, coordinated IADLs (meal prep, sports, etc.).  Goal status: 05/12/24: Goal met, will be upgraded to 55 blocks and 1 minute to be closer to average  5.  Pt will improve coordination skills in left hand and arm, as seen by scoring at least 55 blocks on box and blocks testing to have increased functional ability to carry out fine motor tasks (fasteners, etc.) and more complex, coordinated IADLs (meal prep, sports, etc.).  Goal status: INITIAL    ASSESSMENT:  CLINICAL IMPRESSION: 06/05/24: Doing well, carry on!   06/02/24: Continues to improve range of motion, strength, ability.  Carry on     PLAN:  OT FREQUENCY: 1-2x/week  OT DURATION:: Up to 6 additional weeks from 05/08/2024 through 06/26/2024 and up to 15 total visits as needed  PLANNED INTERVENTIONS: 97535 self care/ADL training, 02889 therapeutic exercise, 97530 therapeutic activity, 97112 neuromuscular re-education, 97140 manual therapy, 97032 electrical stimulation (manual), 97760 Orthotic Initial, 97763 Orthotic/Prosthetic subsequent, coping strategies training, patient/family education, and DME and/or AE instructions  RECOMMENDED OTHER SERVICES: None now  CONSULTED AND AGREED WITH PLAN OF CARE: Patient  PLAN FOR NEXT SESSION:   Remind the patient that the following visit will be a discharge visit, address any specific    Melvenia Ada, OTR/L, CHT 06/09/2024, 3:31 PM

## 2024-06-09 ENCOUNTER — Ambulatory Visit: Admitting: Rehabilitative and Restorative Service Providers"

## 2024-06-09 ENCOUNTER — Encounter: Payer: Self-pay | Admitting: Rehabilitative and Restorative Service Providers"

## 2024-06-09 DIAGNOSIS — M25612 Stiffness of left shoulder, not elsewhere classified: Secondary | ICD-10-CM

## 2024-06-09 DIAGNOSIS — M5459 Other low back pain: Secondary | ICD-10-CM | POA: Diagnosis not present

## 2024-06-09 DIAGNOSIS — R278 Other lack of coordination: Secondary | ICD-10-CM | POA: Diagnosis not present

## 2024-06-09 DIAGNOSIS — M6281 Muscle weakness (generalized): Secondary | ICD-10-CM

## 2024-06-12 NOTE — Therapy (Signed)
 OUTPATIENT OCCUPATIONAL THERAPY TREATMENT NOTE  Patient Name: Dustin [name] MRN: [id number] DOB:1951/05/22, 73 y.o., male Today's Date: 06/16/2024  PCP: Xenia Devonshire, MD REFERRING PROVIDER: Georgina Ozell LABOR, MD   END OF SESSION:  OT End of Session - 06/16/24 1353     Visit Number 14    Number of Visits 15    Date for OT Re-Evaluation 06/26/24    Authorization Type [employer]    OT Start Time 1353    OT Stop Time 1435    OT Time Calculation (min) 42 min    Activity Tolerance Patient tolerated treatment well;No increased pain;Patient limited by fatigue    Behavior During Therapy [facility] for tasks assessed/performed            Past Medical History:  Diagnosis Date   Aortic regurgitation    moderate to severe AR 01/22/24   Arthritis    CKD (chronic kidney disease)    Coronary artery disease    GERD (gastroesophageal reflux disease) 07/01/2018   Heart murmur    stage 4, 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   Hypertension    Hypothyroidism 07/01/2018   Paroxysmal A-fib (HCC)    S/P mitral valve repair 07/08/2017   Spinal stenosis of lumbar region 08/22/2021   Past Surgical History:  Procedure Laterality Date   ANTERIOR CERVICAL DECOMP/DISCECTOMY FUSION N/A 03/10/2024   Procedure: ANTERIOR CERVICAL DECOMPRESSION/DISCECTOMY FUSION 2 LEVELS;  Surgeon: Georgina Ozell LABOR, MD;  Location: [facility] OR;  Service: Orthopedics;  Laterality: N/A;  C3-5 ANTERIOR CERVICAL DISCECTOMY AND FUSION   CARDIAC CATHETERIZATION  05/14/2017   hx ablation for A-flutter  11/01/2017   MITRAL VALVE REPAIR (MV)/CORONARY ARTERY BYPASS GRAFTING (CABG)  07/08/2017   SVG-OM, LAA closure, MV reconstruction with annuloplasty and sliding plasty via superior septal approach/Medtronic Simplici-T Annuloplasty System, left GSV harvest 07/08/17 by Rosalynn Dines, MD at [facility]   TONSILLECTOMY     removed as a child   Patient Active Problem List   Diagnosis Date Noted   Cervical radiculopathy 03/10/2024   S/P lumbar  fusion 12/01/2021   Lumbar stenosis 11/29/2021   S/P mitral valve repair 07/01/2018   OSA (obstructive sleep apnea) 07/01/2018   GERD (gastroesophageal reflux disease) 07/01/2018   Hyperlipidemia 07/01/2018   Erectile dysfunction 07/01/2018   Hypothyroid 07/01/2018    ONSET DATE: DOS 03/10/24  REFERRING DIAG: M70.101 (ICD-10-CM) - Left arm weakness   THERAPY DIAG:  Muscle weakness (generalized)  Other lack of coordination  Stiffness of left shoulder, not elsewhere classified  Rationale for Evaluation and Treatment: Rehabilitation  PERTINENT HISTORY: hx of other spinal fusion, past PT for LE weakness and decreased functional endurance in 2024.  Lt arm has been weak for approx 6 months or more  He states his Lt arm has been getting weaker over 1.5 years and finally had need for cervical fusion to prevent nerve compression. He states mainly bicep and deltoid weakness. He states having good FMS, but still needs to help with right arm to do buttons due to bicep weakness.  He has started to see some improvements in function after surgery.  PRECAUTIONS: Cervical and Other: No BLT >10#  RED FLAGS: None   WEIGHT BEARING RESTRICTIONS: WBAT now    SUBJECTIVE:   SUBJECTIVE STATEMENT: Now 14 weeks s/p ANTERIOR CERVICAL DECOMPRESSION/DISCECTOMY FUSION 2 LEVELS (N/A) - C3-5 ANTERIOR CERVICAL DISCECTOMY AND FUSION   He states he had a bout of low blood pressure this morning, but is feeling okay now.  He would like  it checked before we begin.      PAIN:  Are you having pain?     No, fatigue is main issue    PATIENT GOALS: To improve use of left hand and arm for daily function   NEXT MD VISIT: 04/23/24   OBJECTIVE: (All objective assessments below are from initial evaluation on: 03/31/24 unless otherwise specified.)    HAND DOMINANCE: Ambidextrous  ADLs: Overall ADLs: States some improved ability to perform grooming, self-care, home care, eat and drink using his left upper  extremity, but still having some difficulties.    FUNCTIONAL OUTCOME MEASURES: 05/12/24: PSFS 5 today   Eval: Patient Specific Functional Scale: 1.6 (washing hair, drinking out of a glass with left hand, picking up dog)  (Higher Score  =  Better Ability for the Selected Tasks)      UPPER EXTREMITY ROM     Shoulder to Wrist AROM Right eval Left eval LT 04/09/24 Lt 04/16/24 Lt 04/21/24 Lt 05/12/24 Lt 06/02/24  Shoulder flexion 154* 47 (with hiking)  51 54 54 59 74  Shoulder abduction 162* 72 (a lot of compensation) (140* PROM) 58 63  64 82  Shoulder extension 78 44  45 49 45 51  Shoulder internal rotation 52 44  48  34   Shoulder external rotation 77 75  80  80 WNL   Elbow flexion 155 144 151   146 WNL    Elbow extension (-5) (-5) (-5)    (-10)  (Rt side is (-15))   WNL    Forearm supination  76 78   70 79  Forearm pronation   85    85   Wrist flexion  36 39 54 45 45 68  Wrist extension  76 84 82 81 85 80  (Blank rows = not tested)   Hand AROM Left eval  Full Fist Ability (or Gap to Distal Palmar Crease) full  Thumb Opposition  (Kapandji Scale)  8/10  (Blank rows = not tested)   UPPER EXTREMITY MMT:     MMT Right 03/31/24 Left 03/31/24 Lt 05/12/24  Shoulder flexion 5/5 3-/5 4-/5  Shoulder abduction 5/5 3-/5 3-/5  Shoulder adduction     Shoulder extension  3-/5 4+/5  Shoulder internal rotation  4+/5 4+/5  Shoulder external rotation  4-/5 4-/5  Middle trapezius  3-/5   Lower trapezius  3-/5   Elbow flexion 5/5 3/5 3+/5  Elbow extension 5/5 4+/5 5/5  Forearm supination  3/5 3/5  Forearm pronation  5/5 5/5  Wrist flexion  4+/5 5/5 (in neutral)   Wrist extension  5/5 5/5  Wrist ulnar deviation     Wrist radial deviation     (Blank rows = not tested)  HAND FUNCTION: 05/12/24: Grip Lt: 65.2#   Eval: Slight weakness in affected Lt hand.  Grip strength Right: 81 lbs, Left: 69 lbs   COORDINATION: TBD: BBT Lt: TBD blocks today   05/12/24: BBT Lt: 46 blocks,  (45 is  WFL, 65 is avg)  Eval: Observed coordination impairments with affected Lt hand. Box and Blocks Test: Lt 42 Blocks today (45 is WFL, 65 is avg)   OBSERVATIONS:   Eval: No significant pain, has a lot of compensatory shoulder hiking and scapular weakness with shoulder flexion and abduction.  A pattern of weakness in shoulder flexion, abduction that travels through the bicep, supinator.   TODAY'S TREATMENT:  06/16/24: His initial blood pressure check today is 122/74mmHg and 64 BPM HR at start of session.  He is asymptomatic.  He performs the following activities and exercises, needing cues, rest breaks for fatigue, and encouragement for proper form and function:   Cup pouring activity  UBE 5 mins @ 5.2 resistance x45 RPM  Standing sh flexion eccentric 1# dowel x10  Bent over flys 2# bil 2 x 10-15 Dynamic cup reach/stack activity with 1# dowel bar leverage x6 Bil bar biceps x10 5#   At end of session blood pressure was: 136/77, 68 BPM, and he was still asymptomatic.  He states understanding his home exercises, and that the next session we will be discussing discharge unless he has any concerns or questions that he cannot manage on his own.   PATIENT EDUCATION: Education details: See tx section above for details  Person educated: Patient Education method: Verbal Instruction, Teach back, Handouts  Education comprehension: States and demonstrates understanding, Additional Education required    HOME EXERCISE PROGRAM: Access Code: [email or web address] URL: [email or web address] Date: 03/31/2024 Prepared by: Melvenia Ada   GOALS: Goals reviewed with patient? Yes   SHORT TERM GOALS: (STG required if POC>30 days) Target Date: 04/17/2024  Pt will demo/state understanding of initial HEP to improve pain levels and prerequisite motion. Goal status: 04/16/2024: Met   LONG TERM GOALS: Target Date: 06/26/2024  Pt will improve functional ability by decreased impairment per PSFS  assessment from 1.6 to 5 or better, for better quality of life. Goal status: 05/12/24: Goal met and will be upgraded to 8 now  2.  Pt will improve A/ROM in left shoulder flexion from 47 degrees to at least 90 degrees, to have functional motion for tasks like reach and grasp.  Goal status: 05/12/24: Improved to 59 degrees now  3.  Pt will improve strength in left bicep flexion from 3/5 MMT to at least 4/5 MMT to have increased functional ability to carry out selfcare and higher-level homecare tasks with less difficulty. Goal status: 05/12/24: Improved to 3+/5 MMT now  4.  Pt will improve coordination skills in left hand and arm, as seen by within functional limit score on box and blocks testing to have increased functional ability to carry out fine motor tasks (fasteners, etc.) and more complex, coordinated IADLs (meal prep, sports, etc.).  Goal status: 05/12/24: Goal met, will be upgraded to 55 blocks and 1 minute to be closer to average  5.  Pt will improve coordination skills in left hand and arm, as seen by scoring at least 55 blocks on box and blocks testing to have increased functional ability to carry out fine motor tasks (fasteners, etc.) and more complex, coordinated IADLs (meal prep, sports, etc.).  Goal status: INITIAL    ASSESSMENT:  CLINICAL IMPRESSION: 06/16/24: Continues to perform excellently and improve visual range of motion and ability.  He did well with functional activities that require complex dynamic motions and accuracy today, though still having some weakness in the bicep and supination, they have improved   06/05/24: Doing well, carry on!   06/02/24: Continues to improve range of motion, strength, ability.  Carry on     PLAN:  OT FREQUENCY: 1-2x/week  OT DURATION:: Up to 6 additional weeks from 05/08/2024 through 06/26/2024 and up to 15 total visits as needed  PLANNED INTERVENTIONS: 97535 self care/ADL training, 02889 therapeutic exercise, 97530 therapeutic  activity, 97112 neuromuscular re-education, 97140 manual therapy, 97032 electrical stimulation (manual), [id number] Orthotic Initial, [id number] Orthotic/Prosthetic subsequent, coping strategies training, patient/family education, and DME and/or AE instructions  RECOMMENDED OTHER SERVICES: None now  CONSULTED AND AGREED  WITH PLAN OF CARE: Patient  PLAN FOR NEXT SESSION:   Perform a progress note and planned discharge in last he feels like home exercises or his current knowledge is insufficient to manage his symptoms and continue on himself    Melvenia Ada, OTR/L, CHT 06/16/2024, 4:56 PM

## 2024-06-16 ENCOUNTER — Ambulatory Visit: Admitting: Rehabilitative and Restorative Service Providers"

## 2024-06-16 ENCOUNTER — Encounter: Payer: Self-pay | Admitting: Rehabilitative and Restorative Service Providers"

## 2024-06-16 DIAGNOSIS — M6281 Muscle weakness (generalized): Secondary | ICD-10-CM

## 2024-06-16 DIAGNOSIS — R278 Other lack of coordination: Secondary | ICD-10-CM | POA: Diagnosis not present

## 2024-06-16 DIAGNOSIS — M25612 Stiffness of left shoulder, not elsewhere classified: Secondary | ICD-10-CM | POA: Diagnosis not present

## 2024-06-23 ENCOUNTER — Ambulatory Visit: Admitting: Rehabilitative and Restorative Service Providers"

## 2024-06-23 DIAGNOSIS — R278 Other lack of coordination: Secondary | ICD-10-CM | POA: Diagnosis not present

## 2024-06-23 DIAGNOSIS — M25612 Stiffness of left shoulder, not elsewhere classified: Secondary | ICD-10-CM

## 2024-06-23 DIAGNOSIS — M6281 Muscle weakness (generalized): Secondary | ICD-10-CM | POA: Diagnosis not present

## 2024-06-23 NOTE — Therapy (Signed)
 OUTPATIENT OCCUPATIONAL THERAPY TREATMENT & DISCHARGE NOTE  Patient Name: Dustin [name] MRN: [id number] DOB:1951/08/21, 73 y.o., male Today's Date: 06/23/2024  PCP: Xenia Devonshire, MD REFERRING PROVIDER: Georgina Ozell LABOR, MD               OCCUPATIONAL THERAPY DISCHARGE SUMMARY  Visits from Start of Care: 15  Current functional level related to goals / functional outcomes: 06/23/24: He has significantly improved his status with all of his long-term goals in the past month, though he seems to have had somewhat of a plateau progress continues but is very slow.  The bigger thing is that he has an excellent home exercise and management program, no pain, good functional ability, and the knowledge and understanding to continue these techniques at home himself.  He states that he is confident and can manage these things going forward.  We will successfully discharge outpatient occupational therapy today.  Education / Equipment: Pt has all needed materials and education. Pt understands how to continue on with self-management. See tx notes for more details.   Patient agrees to discharge due to max benefits received from outpatient occupational therapy / hand therapy at this time.   Melvenia Georgina, OTR/L, CHT 06/23/24                END OF SESSION:  OT End of Session - 06/23/24 1348     Visit Number 15    Number of Visits 15    Date for OT Re-Evaluation 06/26/24    Authorization Type [employer]    OT Start Time 1348    OT Stop Time 1426    OT Time Calculation (min) 38 min    Activity Tolerance Patient tolerated treatment well;No increased pain;Patient limited by fatigue    Behavior During Therapy [facility] for tasks assessed/performed             Past Medical History:  Diagnosis Date   Aortic regurgitation    moderate to severe AR 01/22/24   Arthritis    CKD (chronic kidney disease)    Coronary artery disease    GERD (gastroesophageal reflux disease)  07/01/2018   Heart murmur    stage 4, 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   Hypertension    Hypothyroidism 07/01/2018   Paroxysmal A-fib (HCC)    S/P mitral valve repair 07/08/2017   Spinal stenosis of lumbar region 08/22/2021   Past Surgical History:  Procedure Laterality Date   ANTERIOR CERVICAL DECOMP/DISCECTOMY FUSION N/A 03/10/2024   Procedure: ANTERIOR CERVICAL DECOMPRESSION/DISCECTOMY FUSION 2 LEVELS;  Surgeon: Georgina Ozell LABOR, MD;  Location: [facility] OR;  Service: Orthopedics;  Laterality: N/A;  C3-5 ANTERIOR CERVICAL DISCECTOMY AND FUSION   CARDIAC CATHETERIZATION  05/14/2017   hx ablation for A-flutter  11/01/2017   MITRAL VALVE REPAIR (MV)/CORONARY ARTERY BYPASS GRAFTING (CABG)  07/08/2017   SVG-OM, LAA closure, MV reconstruction with annuloplasty and sliding plasty via superior septal approach/Medtronic Simplici-T Annuloplasty System, left GSV harvest 07/08/17 by Rosalynn Dines, MD at [facility]   TONSILLECTOMY     removed as a child   Patient Active Problem List   Diagnosis Date Noted   Cervical radiculopathy 03/10/2024   S/P lumbar fusion 12/01/2021   Lumbar stenosis 11/29/2021   S/P mitral valve repair 07/01/2018   OSA (obstructive sleep apnea) 07/01/2018   GERD (gastroesophageal reflux disease) 07/01/2018   Hyperlipidemia 07/01/2018   Erectile dysfunction 07/01/2018   Hypothyroid 07/01/2018    ONSET DATE: DOS 03/10/24  REFERRING DIAG: M70.101 (ICD-10-CM) - Left  arm weakness   THERAPY DIAG:  Muscle weakness (generalized)  Other lack of coordination  Stiffness of left shoulder, not elsewhere classified  Rationale for Evaluation and Treatment: Rehabilitation  PERTINENT HISTORY: hx of other spinal fusion, past PT for LE weakness and decreased functional endurance in 2024.  Lt arm has been weak for approx 6 months or more  He states his Lt arm has been getting weaker over 1.5 years and finally had need for cervical fusion to prevent nerve compression. He states mainly bicep  and deltoid weakness. He states having good FMS, but still needs to help with right arm to do buttons due to bicep weakness.  He has started to see some improvements in function after surgery.  PRECAUTIONS: N/A  RED FLAGS: None   WEIGHT BEARING RESTRICTIONS: WBAT now    SUBJECTIVE:   SUBJECTIVE STATEMENT: Now 15 weeks s/p ANTERIOR CERVICAL DECOMPRESSION/DISCECTOMY FUSION 2 LEVELS (N/A) - C3-5 ANTERIOR CERVICAL DISCECTOMY AND FUSION   He states having some soreness recently and taking some ibuprofen but he thinks this is just because he stretched a bit too much.  He has been feeling like his function has been improving and he continues to exercise as asked.  He has no new negative symptoms.     PAIN:  Are you having pain?     No, fatigue is main issue    PATIENT GOALS: To improve use of left hand and arm for daily function   NEXT MD VISIT: 04/23/24   OBJECTIVE: (All objective assessments below are from initial evaluation on: 03/31/24 unless otherwise specified.)    HAND DOMINANCE: Ambidextrous  ADLs: Overall ADLs: States some improved ability to perform grooming, self-care, home care, eat and drink using his left upper extremity, but still having some difficulties.    FUNCTIONAL OUTCOME MEASURES: 06/23/24: PSFS: 6   05/12/24: PSFS 5 today   Eval: Patient Specific Functional Scale: 1.6 (washing hair, drinking out of a glass with left hand, picking up dog)  (Higher Score  =  Better Ability for the Selected Tasks)      UPPER EXTREMITY ROM     Shoulder to Wrist AROM Right eval Left eval Lt 06/23/24  Shoulder flexion 154* 47 (with hiking)  101*   Shoulder abduction 162* 72 (a lot of compensation) (140* PROM) 94  Shoulder extension 78 44 65 WNL  Shoulder internal rotation 52 44 32  Shoulder external rotation 77 75 83  Elbow flexion 155 144 153 WNL  Elbow extension (-5) (-5) (-10)   Forearm supination  76 80  Forearm pronation   85 88  Wrist flexion  36 59  Wrist  extension  76 85  (Blank rows = not tested)   Hand AROM Left eval  Full Fist Ability (or Gap to Distal Palmar Crease) full  Thumb Opposition  (Kapandji Scale)  8/10  (Blank rows = not tested)   UPPER EXTREMITY MMT:     MMT Right 03/31/24 Left 03/31/24 Lt 05/12/24 Lt 06/23/24  Shoulder flexion 5/5 3-/5 4-/5 4-/5  Shoulder abduction 5/5 3-/5 3-/5 4-/5  Shoulder adduction      Shoulder extension  3-/5 4+/5 4+/5  Shoulder internal rotation  4+/5 4+/5 5/5  Shoulder external rotation  4-/5 4-/5 4-/5  Middle trapezius  3-/5  4+/5  Lower trapezius  3-/5  4+/5  Elbow flexion 5/5 3/5 3+/5 4-/5  Elbow extension 5/5 4+/5 5/5 5/5  Forearm supination  3/5 3/5 3/5  Forearm pronation  5/5 5/5 5/5  Wrist flexion  4+/5 5/5 (in neutral)  5/5  Wrist extension  5/5 5/5 5/5  (Blank rows = not tested)  HAND FUNCTION: 06/23/24: Lt grip: 70#  (86# Rt)   05/12/24: Grip Lt: 65.2#   Eval: Slight weakness in affected Lt hand.  Grip strength Right: 81 lbs, Left: 69 lbs   COORDINATION: 06/23/24: BBT Lt: 49 blocks today   05/12/24: BBT Lt: 46 blocks,  (45 is WFL, 65 is avg)  Eval: Observed coordination impairments with affected Lt hand. Box and Blocks Test: Lt 42 Blocks today (45 is WFL, 65 is avg)   OBSERVATIONS:   Eval: No significant pain, has a lot of compensatory shoulder hiking and scapular weakness with shoulder flexion and abduction.  A pattern of weakness in shoulder flexion, abduction that travels through the bicep, supinator.   TODAY'S TREATMENT:  06/23/24:  Pt performs AROM, gripping, and strength with left arm/hand/wrist/forearm against therapist's resistance for exercise/activities as well as new measures today. OT also discusses home and functional tasks with the pt and reviews goals.  He has made excellent progress towards all of his long-term goals making improvements since they were upgraded about a month ago.  While he has made progress that is slow and he is plateauing somewhat, and  it is appropriate to discharge today to self-care in his well-known home exercise routines.  We discussed this, quickly reviewing his exercises especially the sleeper stretch as shoulder internal rotation is one of the only areas that has not significantly improved.  OT quickly reviews assisted exercises versus weighted exercises, eccentric's versus isokinetic exercises, etc.  Pt states understanding and agrees to discharge today to self-management and continue exercising 4-5 times a week as able in a nonpainful fashion, having his overall function in mind.   PATIENT EDUCATION: Education details: See tx section above for details  Person educated: Patient Education method: Verbal Instruction, Teach back, Handouts  Education comprehension: States and demonstrates understanding   HOME EXERCISE PROGRAM: Access Code: [email or web address] URL: [email or web address] Date: 03/31/2024 Prepared by: Melvenia Ada   GOALS: Goals reviewed with patient? Yes   SHORT TERM GOALS: (STG required if POC>30 days) Target Date: 04/17/2024  Pt will demo/state understanding of initial HEP to improve pain levels and prerequisite motion. Goal status: 04/16/2024: Met   LONG TERM GOALS: Target Date: 06/26/2024  Pt will improve functional ability by decreased impairment per PSFS assessment from 1.6 to 5 or better, for better quality of life. Goal status: 06/23/24: improved to 6 now, will continue HEP to improve fnl ability    05/12/24: Goal met and will be upgraded to 8 now  2.  Pt will improve A/ROM in left shoulder flexion from 47 degrees to at least 90 degrees, to have functional motion for tasks like reach and grasp.  Goal status: 06/23/24: MET 101*  05/12/24: Improved to 59 degrees now  3.  Pt will improve strength in left bicep flexion from 3/5 MMT to at least 4/5 MMT to have increased functional ability to carry out selfcare and higher-level homecare tasks with less difficulty. Goal status: 06/23/24:  improved to 4-/5 now, will continue HEP   05/12/24: Improved to 3+/5 MMT now  4.  Pt will improve coordination skills in left hand and arm, as seen by within functional limit score on box and blocks testing to have increased functional ability to carry out fine motor tasks (fasteners, etc.) and more complex, coordinated IADLs (meal prep, sports, etc.).  Goal status: 06/23/24: improved to 49 today.. will  continue HEP  05/12/24: Goal met, will be upgraded to 55 blocks and 1 minute to be closer to average     ASSESSMENT:  CLINICAL IMPRESSION: 06/23/24: He has significantly improved his status with all of his long-term goals in the past month, though he seems to have had somewhat of a plateau progress continues but is very slow.  The bigger thing is that he has an excellent home exercise and management program, no pain, good functional ability, and the knowledge and understanding to continue these techniques at home himself.  He states that he is confident and can manage these things going forward.  We will successfully discharge outpatient occupational therapy today.    PLAN:  OT FREQUENCY:D/C  OT DURATION:: D/C  PLANNED INTERVENTIONS: 97535 self care/ADL training, 02889 therapeutic exercise, 97530 therapeutic activity, 97112 neuromuscular re-education, 97140 manual therapy, 97032 electrical stimulation (manual), [id number] Orthotic Initial, [id number] Orthotic/Prosthetic subsequent, coping strategies training, patient/family education, and DME and/or AE instructions  RECOMMENDED OTHER SERVICES: None now  CONSULTED AND AGREED WITH PLAN OF CARE: Patient  PLAN FOR NEXT SESSION:   N/A, D/C     Melvenia Ada, OTR/L, CHT 06/23/2024, 2:31 PM

## 2024-09-07 ENCOUNTER — Other Ambulatory Visit (INDEPENDENT_AMBULATORY_CARE_PROVIDER_SITE_OTHER): Payer: Self-pay

## 2024-09-07 ENCOUNTER — Ambulatory Visit: Admitting: Orthopedic Surgery

## 2024-09-07 DIAGNOSIS — Z981 Arthrodesis status: Secondary | ICD-10-CM

## 2024-09-07 NOTE — Progress Notes (Signed)
 Orthopedic Surgery Post-operative Office Visit   Procedure: C3-5 ACDF Date of Surgery: 03/10/2024 (~6 months post-op)   Assessment: Patient is a 73 y.o. male who has noticed improvement in his left arm strength.  Not having any radiating arm or neck pain     Plan: -No spine specific precautions -Continue to work on left upper extremity strength and endurance -Pain management: tylenol  as needed -Return to office in 6 months, x-rays needed at next visit: AP/lateral/flex/ex cervical   ___________________________________________________________________________     Subjective: Patient has been doing well since he was last seen in the office.  He is not having any neck or radiating arm pain.  He has been working on strengthening the left arm.  He has noticed improvement in his ability to shave and brush his teeth without arm.  He is left-hand dominant.  He still gets fatigue on that side and sometimes left uses right arm to finish the ADL activity.     Objective:   General: no acute distress, appropriate affect Neurologic: alert, answering questions appropriately, following commands Respiratory: unlabored breathing on room air Skin: incision is well healed   MSK (spine):   -Strength exam                                                   Left                  Right Grip strength                5/5                  5/5 Interosseus                  5/5                  5/5 Wrist extension            5/5                  5/5 Wrist flexion                 5/5                  5/5 Elbow flexion                4/5                 5/5 Deltoid                          4/5                  5/5   EHL                              4/5                  4/5 TA                                 5/5                  5/5 GSC  5/5                  5/5 Knee extension            5/5                  5/5 Hip flexion                    5/5                  5/5   -Sensory exam                            Sensation intact to light touch in L2-S1 nerve distributions of bilateral lower extremities             Sensation intact to light touch in C4-T1 nerve distributions of bilateral upper extremities     Imaging: XRs of the cervical spine from 09/07/24 were independently reviewed and interpreted, showing anterior mentation from C3-C5.  No lucency seen around the screws.  No screws backed out.  There are interbody devices at C3/4 and C4/5.  Disc height loss seen at C5/6 and C6/7.  No translation seen on the flexion/extension views.  No fracture or dislocation seen.    Patient name: Dustin [name] Patient MRN: [id number] Date of visit: 09/07/24   Pre-operative Scores   NDI: 24% VAS arm: 7 VAS neck: 2  6 Month Post-operative Scores   NDI: 6% VAS arm: 0 VAS neck: 0

## 2024-09-21 ENCOUNTER — Encounter: Payer: Self-pay | Admitting: Radiology

## 2024-10-23 ENCOUNTER — Ambulatory Visit: Admitting: Orthopedic Surgery

## 2024-10-23 ENCOUNTER — Other Ambulatory Visit (INDEPENDENT_AMBULATORY_CARE_PROVIDER_SITE_OTHER): Payer: Self-pay

## 2024-10-23 ENCOUNTER — Encounter: Payer: Self-pay | Admitting: Orthopedic Surgery

## 2024-10-23 DIAGNOSIS — M25561 Pain in right knee: Secondary | ICD-10-CM

## 2024-10-23 DIAGNOSIS — M1711 Unilateral primary osteoarthritis, right knee: Secondary | ICD-10-CM

## 2024-10-23 MED ORDER — BUPIVACAINE HCL 0.25 % IJ SOLN
4.0000 mL | INTRAMUSCULAR | Status: AC | PRN
  Administered 2024-10-23: 4 mL via INTRA_ARTICULAR

## 2024-10-23 MED ORDER — TRIAMCINOLONE ACETONIDE 40 MG/ML IJ SUSP
40.0000 mg | INTRAMUSCULAR | Status: AC | PRN
  Administered 2024-10-23: 40 mg via INTRA_ARTICULAR

## 2024-10-23 MED ORDER — LIDOCAINE HCL 1 % IJ SOLN
5.0000 mL | INTRAMUSCULAR | Status: AC | PRN
  Administered 2024-10-23: 5 mL

## 2024-10-23 NOTE — Progress Notes (Signed)
 Office Visit Note   Patient: Dustin [name]           Date of Birth: Mar 22, 1951           MRN: [id number] Visit Date: 10/23/2024 Requested by: [place] [id number] [place],  [place] [address] PCP: Xenia Devonshire, MD  Subjective: Chief Complaint  Patient presents with   Right Knee - Pain    HPI: Dustin [name] is a 73 y.o. male who presents to the office reporting right knee pain.  Has particular pain with extension.  Has a history of torn cartilage in his knee at age 77 or 27.  Was treated in a long-leg cast for about 6 weeks and did not require surgery.  Overall he has done reasonably well with his knee until his last few years.  Particular over the past several months his knee pain has increased.  He feels like when he was doing the hamstring strengthening exercises at the gym that his knee pain increased..                ROS: All systems reviewed are negative as they relate to the chief complaint within the history of present illness.  Patient denies fevers or chills.  Assessment & Plan: Visit Diagnoses:  1. 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  Has never had patellar instability.  I do not think there is necessarily a structural problem in the knee but this is essentially arthritis pain that really has nowhere else to go.  He has got good range of motion and no effusion.  I think we may be able to inject the knee and reset this to the way it was several months ago.  Gel injection has neck step.  Does not really look like he is near knee replacement territory yet. This patient is diagnosed with osteoarthritis of the knee(s).    Radiographs show evidence of joint space narrowing, osteophytes, subchondral sclerosis and/or subchondral cysts.  This patient has knee pain which interferes with functional and activities of daily living.    This patient has  experienced inadequate response, adverse effects and/or intolerance with conservative treatments such as acetaminophen , NSAIDS, topical creams, physical therapy or regular exercise, knee bracing and/or weight loss.   This patient has experienced inadequate response or has a contraindication to intra articular steroid injections for at least 3 months.   This patient is not scheduled to have a total knee replacement within 6 months of starting treatment with viscosupplementation.   Follow-Up Instructions: No follow-ups on file.   Orders:  Orders Placed This Encounter  Procedures   XR Knee 1-2 Views Right   Ambulatory request for injection medication   No orders of the defined types were placed in this encounter.     Procedures: Large Joint Inj: R knee on 10/23/2024 5:14 PM Indications: diagnostic evaluation, joint swelling and pain Details: 18 G 1.5 in needle, superolateral approach  Arthrogram: No  Medications: 5 mL lidocaine  1 %; 4 mL bupivacaine  0.25 %; 40 mg triamcinolone  acetonide 40 MG/ML Outcome: tolerated well, no immediate complications Procedure, treatment alternatives, risks and benefits explained, specific risks discussed. Consent was given by the patient. Immediately prior to procedure a time out was called to verify the correct patient, procedure, equipment, support staff and site/side marked as required. Patient was prepped and draped in the usual sterile fashion.  Clinical Data: No additional findings.  Objective: Vital Signs: There were no vitals taken for this visit.  Physical Exam:  Constitutional: Patient appears well-developed HEENT:  Head: Normocephalic Eyes:EOM are normal Neck: Normal range of motion Cardiovascular: Normal rate Pulmonary/chest: Effort normal Neurologic: Patient is alert Skin: Skin is warm Psychiatric: Patient has normal mood and affect  Ortho Exam: Ortho exam demonstrates full active and passive range of motion of both  knees.  No groin pain with internal/external Tatian of the leg.  Has fairly coarse bone-on-bone patellofemoral crepitus more on the right than the left.  Negative patellar apprehension.  Collateral cruciate ligaments are stable.  Pedal pulses palpable.  No other masses lymphadenopathy or skin changes noted in that knee region.  Specialty Comments:  EXAM: MRI LUMBAR SPINE WITHOUT CONTRAST   TECHNIQUE: Multiplanar, multisequence MR imaging of the lumbar spine was performed. No intravenous contrast was administered.   COMPARISON:  10/16/2011   FINDINGS: Segmentation:  5 lumbar type vertebral bodies.   Alignment: 11 mm of anterolisthesis at L4-5, increased from 7 mm on the study of 20/12.   Vertebrae: No fracture or primary bone lesion. Chronic discogenic endplate marrow changes at T11-12, L2-3 and L4-5. No active edematous change. Insignificant hemangioma within the right posterior corner of the T11 vertebral body.   Conus medullaris and cauda equina: Conus extends to the T12-L1 level. Conus and cauda equina appear normal.   Paraspinal and other soft tissues: Negative   Disc levels:   T11-12: Disc degeneration with mild disc bulge. No compressive stenosis.   T12-L1 and L1-2: Normal.   L2-3: Desiccation and moderate bulging of the disc. Mild facet and ligamentous prominence. Mild stenosis of both lateral recesses and foramina but without definite neural compression. Slight worsening since 2012.   L3-4: Moderate bulging of the disc. Mild facet and ligamentous hypertrophy. Mild stenosis of the lateral recesses and foramina but without definite neural compression. Slight worsening since 2012.   L4-5: Chronic facet arthropathy, with 11 mm of anterolisthesis. Chronic disc degeneration with loss of disc height. Severe multifactorial stenosis of the canal and neural foramina likely to cause neural compression. Considerable worsening since 2012.   L5-S1: Mild bulging of the disc.   No canal or foraminal stenosis.   IMPRESSION: 1. L4-5: Severe multifactorial spinal stenosis likely to cause neural compression. Considerable worsening since 2012. Contributing factors include facet arthropathy with 11 mm of anterolisthesis and bulging of the disc. 2. L2-3 and L3-4: Moderate disc bulges. Mild facet and ligamentous hypertrophy. Mild stenosis of the lateral recesses and foramina but without definite neural compression. Slight worsening since 2012.     Electronically Signed   By: Oneil Officer M.D.   On: 11/28/2020 15:54  Imaging: XR Knee 1-2 Views Right Result Date: 10/23/2024 AP lateral radiographs right knee reviewed no acute fracture.  Severe end-stage patellofemoral arthritis is present.  Mild to moderate degenerative changes present in the medial and lateral compartments.    PMFS History: Patient Active Problem List   Diagnosis Date Noted   Cervical radiculopathy 03/10/2024   S/P lumbar fusion 12/01/2021   Lumbar stenosis 11/29/2021   S/P mitral valve repair 07/01/2018   OSA (obstructive sleep apnea) 07/01/2018   GERD (gastroesophageal reflux disease) 07/01/2018   Hyperlipidemia 07/01/2018   Erectile dysfunction 07/01/2018   Hypothyroid 07/01/2018   Past Medical History:  Diagnosis Date   Aortic regurgitation    moderate to severe AR 01/22/24   Arthritis    CKD (chronic kidney disease)  Coronary artery disease    GERD (gastroesophageal reflux disease) 07/01/2018   Heart murmur    stage 4, 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 No pertinent family history.  Past Surgical History:  Procedure Laterality Date   ANTERIOR CERVICAL DECOMP/DISCECTOMY FUSION N/A 03/10/2024   Procedure: ANTERIOR CERVICAL DECOMPRESSION/DISCECTOMY FUSION 2 LEVELS;  Surgeon: Georgina Ozell LABOR, MD;   Location: [facility] OR;  Service: Orthopedics;  Laterality: N/A;  C3-5 ANTERIOR CERVICAL DISCECTOMY AND FUSION   CARDIAC CATHETERIZATION  05/14/2017   hx ablation for A-flutter  11/01/2017   MITRAL VALVE REPAIR (MV)/CORONARY ARTERY BYPASS GRAFTING (CABG)  07/08/2017   SVG-OM, LAA closure, MV reconstruction with annuloplasty and sliding plasty via superior septal approach/Medtronic Simplici-T Annuloplasty System, left GSV harvest 07/08/17 by Rosalynn Dines, MD at [facility]   TONSILLECTOMY     removed as a child   Social History   Occupational History   Not on file  Tobacco Use   Smoking status: Never   Smokeless tobacco: Never  Vaping Use   Vaping status: Never Used  Substance and Sexual Activity   Alcohol use: Yes    Alcohol/week: 1.0 standard drink of alcohol    Types: 1 Glasses of wine per week    Comment: wine daily   Drug use: Never   Sexual activity: Not on file

## 2025-03-08 ENCOUNTER — Ambulatory Visit: Admitting: Orthopedic Surgery
